# Patient Record
Sex: Female | Born: 1950
Health system: Southern US, Community
[De-identification: ages and names within clinical notes are randomized; demographics above are authoritative.]

## PROBLEM LIST (undated history)

## (undated) DIAGNOSIS — J302 Other seasonal allergic rhinitis: Secondary | ICD-10-CM

## (undated) DIAGNOSIS — E785 Hyperlipidemia, unspecified: Secondary | ICD-10-CM

## (undated) DIAGNOSIS — D649 Anemia, unspecified: Secondary | ICD-10-CM

## (undated) DIAGNOSIS — M1710 Unilateral primary osteoarthritis, unspecified knee: Secondary | ICD-10-CM

## (undated) DIAGNOSIS — J449 Chronic obstructive pulmonary disease, unspecified: Secondary | ICD-10-CM

## (undated) DIAGNOSIS — T7840XA Allergy, unspecified, initial encounter: Secondary | ICD-10-CM

## (undated) DIAGNOSIS — M179 Osteoarthritis of knee, unspecified: Secondary | ICD-10-CM

## (undated) DIAGNOSIS — M19012 Primary osteoarthritis, left shoulder: Secondary | ICD-10-CM

## (undated) DIAGNOSIS — Z923 Personal history of irradiation: Secondary | ICD-10-CM

## (undated) DIAGNOSIS — Z87442 Personal history of urinary calculi: Secondary | ICD-10-CM

## (undated) DIAGNOSIS — N189 Chronic kidney disease, unspecified: Secondary | ICD-10-CM

## (undated) DIAGNOSIS — H269 Unspecified cataract: Secondary | ICD-10-CM

## (undated) DIAGNOSIS — J189 Pneumonia, unspecified organism: Secondary | ICD-10-CM

## (undated) DIAGNOSIS — K219 Gastro-esophageal reflux disease without esophagitis: Secondary | ICD-10-CM

## (undated) HISTORY — DX: Other seasonal allergic rhinitis: J30.2

## (undated) HISTORY — DX: Unilateral primary osteoarthritis, unspecified knee: M17.10

## (undated) HISTORY — DX: Chronic obstructive pulmonary disease, unspecified: J44.9

## (undated) HISTORY — PX: COLONOSCOPY: SHX174

## (undated) HISTORY — DX: Anemia, unspecified: D64.9

## (undated) HISTORY — PX: POLYPECTOMY: SHX149

## (undated) HISTORY — DX: Unspecified cataract: H26.9

## (undated) HISTORY — PX: MOUTH SURGERY: SHX715

## (undated) HISTORY — PX: OTHER SURGICAL HISTORY: SHX169

## (undated) HISTORY — DX: Osteoarthritis of knee, unspecified: M17.9

## (undated) HISTORY — DX: Allergy, unspecified, initial encounter: T78.40XA

## (undated) HISTORY — DX: Gastro-esophageal reflux disease without esophagitis: K21.9

## (undated) HISTORY — DX: Chronic kidney disease, unspecified: N18.9

## (undated) HISTORY — DX: Hyperlipidemia, unspecified: E78.5

---
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
## 1968-09-30 HISTORY — PX: WISDOM TOOTH EXTRACTION: SHX21

## 1978-09-30 HISTORY — PX: TUBAL LIGATION: SHX77

## 1986-09-30 HISTORY — PX: OTHER SURGICAL HISTORY: SHX169

## 1990-09-30 HISTORY — PX: ABDOMINAL HYSTERECTOMY: SHX81

## 1993-09-30 HISTORY — PX: APPENDECTOMY: SHX54

## 1998-04-09 ENCOUNTER — Emergency Department (HOSPITAL_COMMUNITY): Admission: EM | Admit: 1998-04-09 | Discharge: 1998-04-09 | Payer: Self-pay | Admitting: Emergency Medicine

## 1998-12-20 ENCOUNTER — Other Ambulatory Visit: Admission: RE | Admit: 1998-12-20 | Discharge: 1998-12-20 | Payer: Self-pay | Admitting: Family Medicine

## 1998-12-20 ENCOUNTER — Ambulatory Visit (HOSPITAL_COMMUNITY): Admission: RE | Admit: 1998-12-20 | Discharge: 1998-12-20 | Payer: Self-pay | Admitting: Family Medicine

## 1998-12-20 ENCOUNTER — Encounter: Payer: Self-pay | Admitting: Family Medicine

## 2001-05-17 ENCOUNTER — Emergency Department (HOSPITAL_COMMUNITY): Admission: EM | Admit: 2001-05-17 | Discharge: 2001-05-17 | Payer: Self-pay | Admitting: Emergency Medicine

## 2001-05-17 ENCOUNTER — Encounter: Payer: Self-pay | Admitting: Emergency Medicine

## 2004-10-16 ENCOUNTER — Emergency Department (HOSPITAL_COMMUNITY): Admission: EM | Admit: 2004-10-16 | Discharge: 2004-10-16 | Payer: Self-pay | Admitting: Emergency Medicine

## 2005-03-11 ENCOUNTER — Emergency Department (HOSPITAL_COMMUNITY): Admission: EM | Admit: 2005-03-11 | Discharge: 2005-03-11 | Payer: Self-pay | Admitting: Emergency Medicine

## 2005-03-28 ENCOUNTER — Ambulatory Visit (HOSPITAL_COMMUNITY): Admission: RE | Admit: 2005-03-28 | Discharge: 2005-03-28 | Payer: Self-pay | Admitting: Cardiology

## 2005-04-15 ENCOUNTER — Ambulatory Visit (HOSPITAL_COMMUNITY): Admission: RE | Admit: 2005-04-15 | Discharge: 2005-04-15 | Payer: Self-pay | Admitting: Cardiology

## 2005-05-02 ENCOUNTER — Encounter: Admission: RE | Admit: 2005-05-02 | Discharge: 2005-05-02 | Payer: Self-pay | Admitting: Family Medicine

## 2009-09-30 HISTORY — PX: MOUTH SURGERY: SHX715

## 2011-12-19 ENCOUNTER — Ambulatory Visit (INDEPENDENT_AMBULATORY_CARE_PROVIDER_SITE_OTHER): Payer: BC Managed Care – PPO | Admitting: Internal Medicine

## 2011-12-19 ENCOUNTER — Encounter: Payer: Self-pay | Admitting: *Deleted

## 2011-12-19 ENCOUNTER — Encounter: Payer: Self-pay | Admitting: Internal Medicine

## 2011-12-19 ENCOUNTER — Ambulatory Visit (INDEPENDENT_AMBULATORY_CARE_PROVIDER_SITE_OTHER)
Admission: RE | Admit: 2011-12-19 | Discharge: 2011-12-19 | Disposition: A | Discharge status: Home / Self Care | Payer: BC Managed Care – PPO | Source: Ambulatory Visit | Attending: Internal Medicine | Admitting: Internal Medicine

## 2011-12-19 VITALS — BP 118/72 | HR 79 | Temp 98.9°F | Ht 62.0 in | Wt 158.0 lb

## 2011-12-19 DIAGNOSIS — Z720 Tobacco use: Secondary | ICD-10-CM | POA: Insufficient documentation

## 2011-12-19 DIAGNOSIS — R05 Cough: Secondary | ICD-10-CM

## 2011-12-19 DIAGNOSIS — J441 Chronic obstructive pulmonary disease with (acute) exacerbation: Secondary | ICD-10-CM

## 2011-12-19 DIAGNOSIS — F17200 Nicotine dependence, unspecified, uncomplicated: Secondary | ICD-10-CM

## 2011-12-19 DIAGNOSIS — J449 Chronic obstructive pulmonary disease, unspecified: Secondary | ICD-10-CM | POA: Insufficient documentation

## 2011-12-19 MED ORDER — LEVOFLOXACIN 500 MG PO TABS: 500.0000 mg | ORAL_TABLET | Freq: Every day | ORAL | Status: AC

## 2011-12-19 MED ORDER — PREDNISONE (PAK) 10 MG PO TABS: 10.0000 mg | ORAL_TABLET | ORAL | Status: AC

## 2011-12-19 MED ORDER — ALBUTEROL SULFATE HFA 108 (90 BASE) MCG/ACT IN AERS: 2.0000 | INHALATION_SPRAY | Freq: Four times a day (QID) | RESPIRATORY_TRACT | Status: DC | PRN

## 2011-12-19 NOTE — Progress Notes (Signed)
  Subjective:    Patient ID: Jessica [name], female    DOB: Feb 04, 1951, 61 y.o.   MRN: [id number]  HPI New pt to me and our practice, here today to establish care - [facility]  complains of cough Onset 1 week ago, progressively worse symptoms  Pt reports prior history of pneumonia similar to current symptoms  Initially sore throat and "burning">progression to head and chest congestion complains of ongoing cough, day and night - not improved with OTC meds (mucinex) associated with yellow-green sputum and nasal discharge with PND - Also associated with mild shortness of breath but denies wheeze or fever  Past Medical History  Diagnosis Date  . GERD (gastroesophageal reflux disease)   . COPD (chronic obstructive pulmonary disease)     infreq exac, continued tobacco abuse  . Osteoarthritis of knee     R knee    Review of Systems  Constitutional: Positive for fatigue. Negative for chills and unexpected weight change.  HENT: Positive for congestion, postnasal drip and sinus pressure. Negative for ear pain, mouth sores, neck pain and tinnitus.   Eyes: Negative for pain.  Respiratory: Positive for chest tightness. Negative for choking, wheezing and stridor.   Cardiovascular: Negative for chest pain, palpitations and leg swelling.  Genitourinary: Negative for dysuria and difficulty urinating.       Objective:   Physical Exam BP 118/72  Pulse 79  Temp(Src) 98.9 F (37.2 C) (Oral)  Ht 5\' 2"  (1.575 m)  Wt 158 lb (71.668 kg)  BMI 28.90 kg/m2  SpO2 97% Wt Readings from Last 3 Encounters:  12/19/11 158 lb (71.668 kg)   Constitutional: She appears well-developed and well-nourished. No distress.  HENT: Head: Normocephalic and atraumatic. Ears: B TMs ok, no erythema or effusion; Nose: Nose normal. Mouth/Throat: Oropharynx is red but without oropharyngeal exudate. missing 2 lower front teeth Eyes: Conjunctivae and EOM are normal. Pupils are equal, round, and reactive to light.  No scleral icterus.  Neck: Normal range of motion. Neck supple. No JVD or LAD present. No thyromegaly present.  Cardiovascular: Normal rate, regular rhythm and normal heart sounds.  No murmur heard. No BLE edema. Pulmonary/Chest: diminished base breath sounds with RLL crackles. No respiratory distress at rest or conversational dyspnea. She has few mild end exp wheezes.  Neurological: She is alert and oriented to person, place, and time. No cranial nerve deficit. Coordination normal.  Skin: Skin is warm and dry. No rash noted. No erythema.  Psychiatric: She has a normal mood and affect. Her behavior is normal. Judgment and thought content normal.   No results found for this basename: WBC, HGB, HCT, PLT, GLUCOSE, CHOL, TRIG, HDL, LDLDIRECT, LDLCALC, ALT, AST, NA, K, CL, CREATININE, BUN, CO2, TSH, PSA, INR, GLUF, HGBA1C, MICROALBUR        Assessment & Plan:  Cough, ?PNA vs bronchitis Tobacco abuse COPD exac, mild due to above  Check CXR Levaquin, pred pak and Alb MDI - erx done Advised to quit smoking - 5 minutes today spent counseling patient on unhealthy effects of continued tobacco abuse and encouragement of cessation including medical options available to help the patient quit smoking.  Send for ROI from prior provider

## 2011-12-19 NOTE — Patient Instructions (Signed)
It was good to see you today. Levaquin antibiotics, pred taper and albuterol inhaler to use as needed - Your prescription(s) have been submitted to your pharmacy. Please take as directed and contact our office if you believe you are having problem(s) with the medication(s). Chest xray ordered today. Your results will be called to you after review (48-72hours after test completion). If any changes need to be made, you will be notified at that time. Please schedule followup in 2 months for physical and labs, call sooner if problems. Smoking Cessation, Tips for Success YOU CAN QUIT SMOKING If you are ready to quit smoking, congratulations! You have chosen to help yourself be healthier. Cigarettes bring nicotine, tar, carbon monoxide, and other irritants into your body. Your lungs, heart, and blood vessels will be able to work better without these poisons. There are many different ways to quit smoking. Nicotine gum, nicotine patches, a nicotine inhaler, or nicotine nasal spray can help with physical craving. Hypnosis, support groups, and medicines help break the habit of smoking. Here are some tips to help you quit for good.  Throw away all cigarettes.   Clean and remove all ashtrays from your home, work, and car.   On a card, write down your reasons for quitting. Carry the card with you and read it when you get the urge to smoke.   Cleanse your body of nicotine. Drink enough water and fluids to keep your urine clear or pale yellow. Do this after quitting to flush the nicotine from your body.   Learn to predict your moods. Do not let a bad situation be your excuse to have a cigarette. Some situations in your life might tempt you into wanting a cigarette.   Never have "just one" cigarette. It leads to wanting another and another. Remind yourself of your decision to quit.   Change habits associated with smoking. If you smoked while driving or when feeling stressed, try other activities to replace  smoking. Stand up when drinking your coffee. Brush your teeth after eating. Sit in a different chair when you read the paper. Avoid alcohol while trying to quit, and try to drink fewer caffeinated beverages. Alcohol and caffeine may urge you to smoke.   Avoid foods and drinks that can trigger a desire to smoke, such as sugary or spicy foods and alcohol.   Ask people who smoke not to smoke around you.   Have something planned to do right after eating or having a cup of coffee. Take a walk or exercise to perk you up. This will help to keep you from overeating.   Try a relaxation exercise to calm you down and decrease your stress. Remember, you may be tense and nervous for the first 2 weeks after you quit, but this will pass.   Find new activities to keep your hands busy. Play with a pen, coin, or rubber band. Doodle or draw things on paper.   Brush your teeth right after eating. This will help cut down on the craving for the taste of tobacco after meals. You can try mouthwash, too.   Use oral substitutes, such as lemon drops, carrots, a cinnamon stick, or chewing gum, in place of cigarettes. Keep them handy so they are available when you have the urge to smoke.   When you have the urge to smoke, try deep breathing.   Designate your home as a nonsmoking area.   If you are a heavy smoker, ask your caregiver about a prescription for  nicotine chewing gum. It can ease your withdrawal from nicotine.   Reward yourself. Set aside the cigarette money you save and buy yourself something nice.   Look for support from others. Join a support group or smoking cessation program. Ask someone at home or at work to help you with your plan to quit smoking.   Always ask yourself, "Do I need this cigarette or is this just a reflex?" Tell yourself, "Today, I choose not to smoke," or "I do not want to smoke." You are reminding yourself of your decision to quit, even if you do smoke a cigarette.  HOW WILL I FEEL  WHEN I QUIT SMOKING?  The benefits of not smoking start within days of quitting.   You may have symptoms of withdrawal because your body is used to nicotine (the addictive substance in cigarettes). You may crave cigarettes, be irritable, feel very hungry, cough often, get headaches, or have difficulty concentrating.   The withdrawal symptoms are only temporary. They are strongest when you first quit but will go away within 10 to 14 days.   When withdrawal symptoms occur, stay in control. Think about your reasons for quitting. Remind yourself that these are signs that your body is healing and getting used to being without cigarettes.   Remember that withdrawal symptoms are easier to treat than the major diseases that smoking can cause.   Even after the withdrawal is over, expect periodic urges to smoke. However, these cravings are generally short-lived and will go away whether you smoke or not. Do not smoke!   If you relapse and smoke again, do not lose hope. Most smokers quit 3 times before they are successful.   If you relapse, do not give up! Plan ahead and think about what you will do the next time you get the urge to smoke.  LIFE AS A NONSMOKER: MAKE IT FOR A MONTH, MAKE IT FOR LIFE Day 1: Hang this page where you will see it every day. Day 2: Get rid of all ashtrays, matches, and lighters. Day 3: Drink water. Breathe deeply between sips. Day 4: Avoid places with smoke-filled air, such as bars, clubs, or the smoking section of restaurants. Day 5: Keep track of how much money you save by not smoking. Day 6: Avoid boredom. Keep a good book with you or go to the movies. Day 7: Reward yourself! One week without smoking! Day 8: Make a dental appointment to get your teeth cleaned. Day 9: Decide how you will turn down a cigarette before it is offered to you. Day 10: Review your reasons for quitting. Day 11: Distract yourself. Stay active to keep your mind off smoking and to relieve tension.  Take a walk, exercise, read a book, do a crossword puzzle, or try a new hobby. Day 12: Exercise. Get off the bus before your stop or use stairs instead of escalators. Day 13: Call on friends for support and encouragement. Day 14: Reward yourself! Two weeks without smoking! Day 15: Practice deep breathing exercises. Day 16: Bet a friend that you can stay a nonsmoker. Day 17: Ask to sit in nonsmoking sections of restaurants. Day 18: Hang up "No Smoking" signs. Day 19: Think of yourself as a nonsmoker. Day 20: Each morning, tell yourself you will not smoke. Day 21: Reward yourself! Three weeks without smoking! Day 22: Think of smoking in negative ways. Remember how it stains your teeth, gives you bad breath, and leaves you short of breath. Day 23: Eat  a nutritious breakfast. Day 24:Do not relive your days as a smoker. Day 25: Hold a pencil in your hand when talking on the telephone. Day 26: Tell all your friends you do not smoke. Day 27: Think about how much better food tastes. Day 28: Remember, one cigarette is one too many. Day 29: Take up a hobby that will keep your hands busy. Day 30: Congratulations! One month without smoking! Give yourself a big reward. Your caregiver can direct you to community resources or hospitals for support, which may include:  Group support.   Education.   Hypnosis.   Subliminal therapy.  Document Released: 06/14/2004 Document Revised: 09/05/2011 Document Reviewed: 07/03/2009 [facility] Patient Information 2012 [place], [place].

## 2012-01-18 ENCOUNTER — Encounter: Payer: Self-pay | Admitting: Family Medicine

## 2012-01-18 ENCOUNTER — Ambulatory Visit (INDEPENDENT_AMBULATORY_CARE_PROVIDER_SITE_OTHER): Payer: BC Managed Care – PPO | Admitting: Family Medicine

## 2012-01-18 VITALS — BP 120/68 | Temp 98.0°F | Wt 159.0 lb

## 2012-01-18 DIAGNOSIS — J329 Chronic sinusitis, unspecified: Secondary | ICD-10-CM

## 2012-01-18 DIAGNOSIS — J40 Bronchitis, not specified as acute or chronic: Secondary | ICD-10-CM

## 2012-01-18 MED ORDER — DOXYCYCLINE HYCLATE 100 MG PO TABS: 100.0000 mg | ORAL_TABLET | Freq: Two times a day (BID) | ORAL | Status: AC

## 2012-01-18 NOTE — Patient Instructions (Signed)

## 2012-01-18 NOTE — Progress Notes (Signed)
  Subjective:    Patient ID: Jessica [name], female    DOB: 03-Aug-1951, 61 y.o.   MRN: [id number]  HPI Cough and congestion.  Says was seen a couple of weeks ago.  A week ago outside all day with fresh cut grass and thought just had allergies.  Then on Monday (6 days ) has very ST, severe nasal congestion with nasal green d/c. Thick productive cough.  No fever.  Has been having chills.  No cough or cold medications.  Using some vicks at night. Hx of COPD.  Not using her inhaler. No SOB. No wheezing.    Review of Systems     Objective:   Physical Exam  Constitutional: She is oriented to person, place, and time. She appears well-developed and well-nourished.  HENT:  Head: Normocephalic and atraumatic.  Right Ear: External ear normal.  Left Ear: External ear normal.  Nose: Nose normal.  Mouth/Throat: Oropharynx is clear and moist.       TMs and canals are clear.   Eyes: Conjunctivae and EOM are normal. Pupils are equal, round, and reactive to light.  Neck: Neck supple. No thyromegaly present.  Cardiovascular: Normal rate, regular rhythm and normal heart sounds.   Pulmonary/Chest: Effort normal and breath sounds normal. She has no wheezes.       End inspiratory wheeze.   Lymphadenopathy:    She has no cervical adenopathy.  Neurological: She is alert and oriented to person, place, and time.  Skin: Skin is warm and dry.  Psychiatric: She has a normal mood and affect.          Assessment & Plan:  Sinsitis/bronchitis - Still smoking. Right now COPD is stable. Did encourage her to start using inhaler bid while sick.  Start doxy.  Just had course of levaquin about 4 weeks ago.  Nasal saline. Salt water gargles. Call if breathing gets worse or gets a fever.  comple all the ABX.

## 2012-02-20 ENCOUNTER — Ambulatory Visit: Payer: BC Managed Care – PPO | Admitting: Internal Medicine

## 2012-02-21 ENCOUNTER — Encounter: Payer: Self-pay | Admitting: Internal Medicine

## 2012-02-21 ENCOUNTER — Other Ambulatory Visit (INDEPENDENT_AMBULATORY_CARE_PROVIDER_SITE_OTHER): Payer: BC Managed Care – PPO

## 2012-02-21 ENCOUNTER — Ambulatory Visit (INDEPENDENT_AMBULATORY_CARE_PROVIDER_SITE_OTHER): Payer: BC Managed Care – PPO | Admitting: Internal Medicine

## 2012-02-21 VITALS — BP 114/70 | HR 76 | Temp 97.1°F | Wt 157.8 lb

## 2012-02-21 DIAGNOSIS — Z0000 Encounter for general adult medical examination without abnormal findings: Secondary | ICD-10-CM

## 2012-02-21 DIAGNOSIS — Z124 Encounter for screening for malignant neoplasm of cervix: Secondary | ICD-10-CM

## 2012-02-21 DIAGNOSIS — Z1211 Encounter for screening for malignant neoplasm of colon: Secondary | ICD-10-CM

## 2012-02-21 DIAGNOSIS — Z23 Encounter for immunization: Secondary | ICD-10-CM

## 2012-02-21 DIAGNOSIS — Z1239 Encounter for other screening for malignant neoplasm of breast: Secondary | ICD-10-CM

## 2012-02-21 LAB — CBC WITH DIFFERENTIAL/PLATELET
Basophils Relative: 0.5 % (ref 0.0–3.0)
Eosinophils Absolute: 0.2 10*3/uL (ref 0.0–0.7)
HCT: 41.5 % (ref 36.0–46.0)
Hemoglobin: 14 g/dL (ref 12.0–15.0)
Lymphocytes Relative: 27.9 % (ref 12.0–46.0)
MCHC: 33.7 g/dL (ref 30.0–36.0)
Neutrophils Relative %: 60.2 % (ref 43.0–77.0)
Platelets: 185 10*3/uL (ref 150.0–400.0)

## 2012-02-21 LAB — LIPID PANEL
Cholesterol: 250 mg/dL — ABNORMAL HIGH (ref 0–200)
Total CHOL/HDL Ratio: 6
Triglycerides: 179 mg/dL — ABNORMAL HIGH (ref 0.0–149.0)

## 2012-02-21 LAB — BASIC METABOLIC PANEL
BUN: 20 mg/dL (ref 6–23)
CO2: 29 mEq/L (ref 19–32)
Calcium: 9.2 mg/dL (ref 8.4–10.5)
Chloride: 106 mEq/L (ref 96–112)
Glucose, Bld: 80 mg/dL (ref 70–99)

## 2012-02-21 LAB — URINALYSIS, ROUTINE W REFLEX MICROSCOPIC
Total Protein, Urine: NEGATIVE
Urine Glucose: NEGATIVE
pH: 5.5 (ref 5.0–8.0)

## 2012-02-21 LAB — HEPATIC FUNCTION PANEL
ALT: 21 U/L (ref 0–35)
AST: 22 U/L (ref 0–37)
Bilirubin, Direct: 0.1 mg/dL (ref 0.0–0.3)
Total Bilirubin: 0.5 mg/dL (ref 0.3–1.2)
Total Protein: 7.5 g/dL (ref 6.0–8.3)

## 2012-02-21 MED ORDER — LORATADINE 10 MG PO TABS: 10.0000 mg | ORAL_TABLET | Freq: Every day | ORAL | Status: DC | PRN

## 2012-02-21 NOTE — Patient Instructions (Signed)
It was good to see you today. We have reviewed your interval medical history today Test(s) ordered today. Your results will be called to you after review (48-72hours after test completion). If any changes need to be made, you will be notified at that time. we'll make referral to mammogram, PAP and colonoscopy. Our office will contact you regarding appointment(s) once made. Health Maintenance reviewed - tetanus booster and pneumonia shot today, will consider the shingles vaccine as discussed- all other recommended immunizations and age-appropriate screenings are up-to-date.  Start claritin for allergy symptoms - Your prescription(s) have been submitted to your pharmacy. Please take as directed and contact our office if you believe you are having problem(s) with the medication(s). Other Medications reviewed, no changes at this time.  Please schedule followup in 6-12 months, call sooner if problems.

## 2012-02-21 NOTE — Progress Notes (Signed)
  Subjective:    Patient ID: Jessica [name], female    DOB: 08-19-1951, 61 y.o.   MRN: [id number]  HPI patient is here today for annual physical. Patient feels well and has no complaints.  Past Medical History  Diagnosis Date  . GERD (gastroesophageal reflux disease)   . COPD (chronic obstructive pulmonary disease)     infreq exac, continued tobacco abuse  . Osteoarthritis of knee     R knee   Family History  Problem Relation Age of Onset  . Breast cancer Maternal Aunt   . Hypertension Mother   . Stroke Other   . Heart disease Mother   . Hypertension Brother   . Multiple sclerosis Father    History  Substance Use Topics  . Smoking status: Current Everyday Smoker  . Smokeless tobacco: Not on file  . Alcohol Use: Yes     Infrequently      Review of Systems Constitutional: Negative for fever or weight change.  Respiratory: Negative for cough and shortness of breath.   Cardiovascular: Negative for chest pain or palpitations.  Gastrointestinal: Negative for abdominal pain, no bowel changes.  Musculoskeletal: Negative for gait problem or joint swelling.  Skin: Negative for rash.  Neurological: Negative for dizziness or headache.  No other specific complaints in a complete review of systems (except as listed in HPI above).     Objective:   Physical Exam BP 114/70  Pulse 76  Temp(Src) 97.1 F (36.2 C) (Oral)  Wt 157 lb 12.8 oz (71.578 kg) Wt Readings from Last 3 Encounters:  02/21/12 157 lb 12.8 oz (71.578 kg)  01/18/12 159 lb (72.122 kg)  12/19/11 158 lb (71.668 kg)   Constitutional: She appears well-developed and well-nourished. No distress.  HENT: Head: Normocephalic and atraumatic. Ears: B TMs ok, no erythema or effusion; Nose: Nose normal. Mouth/Throat: Oropharynx is clear and moist. No oropharyngeal exudate.  Eyes: Conjunctivae and EOM are normal. Pupils are equal, round, and reactive to light. No scleral icterus.  Neck: Normal range of motion. Neck supple.  No JVD present. No thyromegaly present.  Cardiovascular: Normal rate, regular rhythm and normal heart sounds.  No murmur heard. No BLE edema. Pulmonary/Chest: Effort normal and breath sounds normal. No respiratory distress. She has no wheezes.  Abdominal: Soft. Bowel sounds are normal. She exhibits no distension. There is no tenderness. no masses Musculoskeletal: Normal range of motion, no joint effusions. No gross deformities Neurological: She is alert and oriented to person, place, and time. No cranial nerve deficit. Coordination normal.  Skin: Skin is warm and dry. No rash noted. No erythema.  Psychiatric: She has a normal mood and affect. Her behavior is normal. Judgment and thought content normal.   No results found for this basename: WBC, HGB, HCT, PLT, GLUCOSE, CHOL, TRIG, HDL, LDLDIRECT, LDLCALC, ALT, AST, NA, K, CL, CREATININE, BUN, CO2, TSH, PSA, INR, GLUF, HGBA1C, MICROALBUR       Assessment & Plan:  CPX/v70.0 - Patient has been counseled on age-appropriate routine health concerns for screening and prevention. These are reviewed and up-to-date. Immunizations are up-to-date or declined. Labs ordered and will be reviewed.

## 2012-03-25 ENCOUNTER — Encounter: Payer: Self-pay | Admitting: Internal Medicine

## 2012-04-29 ENCOUNTER — Ambulatory Visit (INDEPENDENT_AMBULATORY_CARE_PROVIDER_SITE_OTHER): Payer: BC Managed Care – PPO | Admitting: Endocrinology

## 2012-04-29 ENCOUNTER — Encounter: Payer: Self-pay | Admitting: Endocrinology

## 2012-04-29 VITALS — BP 110/68 | HR 74 | Temp 98.1°F | Wt 157.0 lb

## 2012-04-29 DIAGNOSIS — H6120 Impacted cerumen, unspecified ear: Secondary | ICD-10-CM

## 2012-04-29 NOTE — Patient Instructions (Addendum)
Please call if there is anything else we can do for you

## 2012-04-29 NOTE — Progress Notes (Signed)
  Subjective:    Patient ID: Jessica [name], female    DOB: 1951/03/05, 61 y.o.   MRN: [id number]  HPI Pt states few days of slightly decreased hearing in the right ear, and assoc sensation of foreign body.  She thinks it is a q-tip. Past Medical History  Diagnosis Date  . GERD (gastroesophageal reflux disease)   . COPD (chronic obstructive pulmonary disease)     infreq exac, continued tobacco abuse  . Osteoarthritis of knee     R knee    Past Surgical History  Procedure Date  . Appendectomy 1995  . Abdominal hysterectomy 1992  . Right knee surgery 88 & 02    "torn cartledge"    History   Social History  . Marital Status: Married    Spouse Name: N/A    Number of Children: N/A  . Years of Education: N/A   Occupational History  . Not on file.   Social History Main Topics  . Smoking status: Current Everyday Smoker  . Smokeless tobacco: Not on file  . Alcohol Use: Yes     Infrequently   . Drug Use: No  . Sexually Active: Not on file   Other Topics Concern  . Not on file   Social History Narrative  . No narrative on file    Current Outpatient Prescriptions on File Prior to Visit  Medication Sig Dispense Refill  . albuterol (PROVENTIL HFA;VENTOLIN HFA) 108 (90 BASE) MCG/ACT inhaler Inhale 2 puffs into the lungs every 6 (six) hours as needed for wheezing.  1 Inhaler  0  . b complex vitamins tablet Take 1 tablet by mouth daily.      Marland Kitchen DIGESTIVE ENZYMES PO Take by mouth daily.      Marland Kitchen loratadine (CLARITIN) 10 MG tablet Take 1 tablet (10 mg total) by mouth daily as needed for allergies.  30 tablet  2  . DISCONTD: Digestive Enzymes (ACID-EASE PO) Take by mouth daily as needed.        No Known Allergies  Family History  Problem Relation Age of Onset  . Breast cancer Maternal Aunt   . Hypertension Mother   . Stroke Other   . Heart disease Mother   . Hypertension Brother   . Multiple sclerosis Father     BP 110/68  Pulse 74  Temp 98.1 F (36.7 C) (Oral)  Wt  157 lb (71.215 kg)  SpO2 95%  Review of Systems Denies otalgia    Objective:   Physical Exam VITAL SIGNS:  See vs page GENERAL: no distress Right eac:  q-tip is removed eac is still occluded with cerumen   Intervention:  The eac is irrigated, with resolution of sxs and abnormal findings     Assessment & Plan:  Successful q-tip removal Successful rx of eac impaction

## 2012-05-11 ENCOUNTER — Encounter: Payer: Self-pay | Admitting: Gastroenterology

## 2012-08-10 ENCOUNTER — Encounter: Payer: Self-pay | Admitting: Internal Medicine

## 2012-08-10 ENCOUNTER — Ambulatory Visit (INDEPENDENT_AMBULATORY_CARE_PROVIDER_SITE_OTHER): Payer: BC Managed Care – PPO | Admitting: Internal Medicine

## 2012-08-10 VITALS — BP 120/70 | HR 77 | Temp 98.4°F | Ht 62.0 in | Wt 159.4 lb

## 2012-08-10 DIAGNOSIS — J209 Acute bronchitis, unspecified: Secondary | ICD-10-CM

## 2012-08-10 DIAGNOSIS — J441 Chronic obstructive pulmonary disease with (acute) exacerbation: Secondary | ICD-10-CM

## 2012-08-10 MED ORDER — LEVOFLOXACIN 500 MG PO TABS: 500.0000 mg | ORAL_TABLET | Freq: Every day | ORAL | Status: DC

## 2012-08-10 MED ORDER — PREDNISONE (PAK) 10 MG PO TABS: 10.0000 mg | ORAL_TABLET | ORAL | Status: DC

## 2012-08-10 MED ORDER — ALBUTEROL SULFATE HFA 108 (90 BASE) MCG/ACT IN AERS: 2.0000 | INHALATION_SPRAY | Freq: Four times a day (QID) | RESPIRATORY_TRACT | Status: DC | PRN

## 2012-08-10 MED ORDER — HYDROCODONE-HOMATROPINE 5-1.5 MG/5ML PO SYRP: 5.0000 mL | ORAL_SOLUTION | Freq: Four times a day (QID) | ORAL | Status: DC | PRN

## 2012-08-10 NOTE — Patient Instructions (Signed)
It was good to see you today. Levaquin antibiotics, pred taper x 6 days and prescription cough syrup - Your prescription(s) have been submitted to your pharmacy. Please take as directed and contact our office if you believe you are having problem(s) with the medication(s). Also Albuterol rescue inhaler to use as needed for cough/wheeze symptoms  Alternate between ibuprofen and tylenol for aches, pain and fever symptoms as discussed Hydrate, rest and call if worse or unimproved Continue to think about giving up cigarettes! Use nicotine gum, nicotine patches or electronic cigarettes. If you're interested in medication to help you quit, please call  - let me know how I can help! Please schedule followup in 6 months for annual visit and labs, call sooner if problems. Acute Bronchitis You have acute bronchitis. This means you have a chest cold. The airways in your lungs are red and sore (inflamed). Acute means it is sudden onset.   CAUSES Bronchitis is most often caused by the same virus that causes a cold. SYMPTOMS    Body aches.   Chest congestion.   Chills.   Cough.   Fever.   Shortness of breath.   Sore throat.  TREATMENT   Acute bronchitis is usually treated with rest, fluids, and medicines for relief of fever or cough. Most symptoms should go away after a few days or a week. Increased fluids may help thin your secretions and will prevent dehydration. Your caregiver may give you an inhaler to improve your symptoms. The inhaler reduces shortness of breath and helps control cough. You can take over-the-counter pain relievers or cough medicine to decrease coughing, pain, or fever. A cool-air vaporizer may help thin bronchial secretions and make it easier to clear your chest. Antibiotics are usually not needed but can be prescribed if you smoke, are seriously ill, have chronic lung problems, are elderly, or you are at higher risk for developing complications. Allergies and asthma can make  bronchitis worse. Repeated episodes of bronchitis may cause longstanding lung problems. Avoid smoking and secondhand smoke. Exposure to cigarette smoke or irritating chemicals will make bronchitis worse. If you are a cigarette smoker, consider using nicotine gum or skin patches to help control withdrawal symptoms. Quitting smoking will help your lungs heal faster. Recovery from bronchitis is often slow, but you should start feeling better after 2 to 3 days. Cough from bronchitis frequently lasts for 3 to 4 weeks. To prevent another bout of acute bronchitis:  Quit smoking.   Wash your hands frequently to get rid of viruses or use a hand sanitizer.   Avoid other people with cold or virus symptoms.   Try not to touch your hands to your mouth, nose, or eyes.  SEEK IMMEDIATE MEDICAL CARE IF:  You develop increased fever, chills, or chest pain.   You have severe shortness of breath or bloody sputum.   You develop dehydration, fainting, repeated vomiting, or a severe headache.   You have no improvement after 1 week of treatment or you get worse.  MAKE SURE YOU:    Understand these instructions.   Will watch your condition.   Will get help right away if you are not doing well or get worse.  Document Released: 10/24/2004 Document Revised: 12/09/2011 Document Reviewed: 01/09/2011 [facility] Patient Information 2013 [place], [place].   You Can Quit Smoking If you are ready to quit smoking or are thinking about it, congratulations! You have chosen to help yourself be healthier and live longer! There are lots of different ways to  quit smoking. Nicotine gum, nicotine patches, a nicotine inhaler, or nicotine nasal spray can help with physical craving. Hypnosis, support groups, and medicines help break the habit of smoking. TIPS TO GET OFF AND STAY OFF CIGARETTES  Learn to predict your moods. Do not let a bad situation be your excuse to have a cigarette. Some situations in your life might tempt you to  have a cigarette.   Ask friends and co-workers not to smoke around you.   Make your home smoke-free.   Never have "just one" cigarette. It leads to wanting another and another. Remind yourself of your decision to quit.   On a card, make a list of your reasons for not smoking. Read it at least the same number of times a day as you have a cigarette. Tell yourself everyday, "I do not want to smoke. I choose not to smoke."   Ask someone at home or work to help you with your plan to quit smoking.   Have something planned after you eat or have a cup of coffee. Take a walk or get other exercise to perk you up. This will help to keep you from overeating.   Try a relaxation exercise to calm you down and decrease your stress. Remember, you may be tense and nervous the first two weeks after you quit. This will pass.   Find new activities to keep your hands busy. Play with a pen, coin, or rubber band. Doodle or draw things on paper.   Brush your teeth right after eating. This will help cut down the craving for the taste of tobacco after meals. You can try mouthwash too.   Try gum, breath mints, or diet candy to keep something in your mouth.  IF YOU SMOKE AND WANT TO QUIT:  Do not stock up on cigarettes. Never buy a carton. Wait until one pack is finished before you buy another.   Never carry cigarettes with you at work or at home.   Keep cigarettes as far away from you as possible. Leave them with someone else.   Never carry matches or a lighter with you.   Ask yourself, "Do I need this cigarette or is this just a reflex?"   Bet with someone that you can quit. Put cigarette money in a piggy bank every morning. If you smoke, you give up the money. If you do not smoke, by the end of the week, you keep the money.   Keep trying. It takes 21 days to change a habit!   Talk to your doctor about using medicines to help you quit. These include nicotine replacement gum, lozenges, or skin patches.    Document Released: 07/13/2009 Document Revised: 12/09/2011 Document Reviewed: 07/13/2009 [facility] Patient Information 2013 [place], [place].

## 2012-08-10 NOTE — Progress Notes (Signed)
  Subjective:    Patient ID: Jessica [name], female    DOB: 12-21-1950, 61 y.o.   MRN: [id number]  HPI  Here for follow up -  Also complains of URI symptoms -  Onset 5 days ago Precipitated by sick contacts and weather changes associated with head>chest congestion, sore throat and fatigue Nasal discharge, productive cough and wheeze, especially lying supine Using OTC meds without much relief of symptoms  Still smoking  Past Medical History  Diagnosis Date  . GERD (gastroesophageal reflux disease)   . COPD (chronic obstructive pulmonary disease)     infreq exac, continued tobacco abuse  . Osteoarthritis of knee     R knee    Review of Systems  Constitutional: Positive for fatigue. Negative for unexpected weight change.  Respiratory: Positive for cough, chest tightness and wheezing.   Cardiovascular: Negative for chest pain and leg swelling.  Musculoskeletal: Negative for myalgias and arthralgias.  Neurological: Negative for dizziness, weakness and headaches.       Objective:   Physical Exam BP 120/70  Pulse 77  Temp 98.4 F (36.9 C) (Oral)  Ht 5\' 2"  (1.575 m)  Wt 159 lb 6.4 oz (72.303 kg)  BMI 29.15 kg/m2  SpO2 98% Wt Readings from Last 3 Encounters:  08/10/12 159 lb 6.4 oz (72.303 kg)  04/29/12 157 lb (71.215 kg)  02/21/12 157 lb 12.8 oz (71.578 kg)   Constitutional: She appears well-developed and well-nourished. No distress.  HENT: Head: Normocephalic and atraumatic. Inus mildly tender to palp. Ears: B TMs ok, no erythema or effusion; Nose: Nose normal. Mouth/Throat: Oropharynx is red with PND, but no oropharyngeal exudate.  Eyes: Conjunctivae and EOM are normal. Pupils are equal, round, and reactive to light. No scleral icterus.  Neck: Normal range of motion. Neck supple. No JVD present. Mild L LAD. No thyromegaly present.  Cardiovascular: Normal rate, regular rhythm and normal heart sounds.  No murmur heard. No BLE edema. Pulmonary/Chest: Rhonchi and exp  wheeze L base -Effort normal - No respiratory distress. She has no wheezes.  Neurological: She is alert and oriented to person, place, and time. No cranial nerve deficit. Coordination normal.  Psychiatric: She has a normal mood and affect. Her behavior is normal. Judgment and thought content normal.   Lab Results  Component Value Date   WBC 7.0 02/21/2012   HGB 14.0 02/21/2012   HCT 41.5 02/21/2012   PLT 185.0 02/21/2012   GLUCOSE 80 02/21/2012   CHOL 250* 02/21/2012   TRIG 179.0* 02/21/2012   HDL 40.20 02/21/2012   LDLDIRECT 188.4 02/21/2012   ALT 21 02/21/2012   AST 22 02/21/2012   NA 141 02/21/2012   K 4.0 02/21/2012   CL 106 02/21/2012   CREATININE 0.8 02/21/2012   BUN 20 02/21/2012   CO2 29 02/21/2012   TSH 1.99 02/21/2012        Assessment & Plan:   Acute bronchitis with COPD exacerbation - Tobacco abuse ongoing  Treat with 7 days Levaquin, six-day prednisone taper and Hydromet for cough symptoms Albuterol rescue inhaler -educated on appropriate use of same 5 minutes today spent counseling patient on unhealthy effects of continued tobacco abuse and encouragement of cessation including medical options available to help the patient quit smoking.

## 2012-08-18 ENCOUNTER — Ambulatory Visit: Payer: BC Managed Care – PPO | Admitting: Internal Medicine

## 2013-02-09 ENCOUNTER — Telehealth: Payer: Self-pay | Admitting: *Deleted

## 2013-02-09 ENCOUNTER — Ambulatory Visit (INDEPENDENT_AMBULATORY_CARE_PROVIDER_SITE_OTHER): Payer: BC Managed Care – PPO | Admitting: Internal Medicine

## 2013-02-09 ENCOUNTER — Encounter: Payer: Self-pay | Admitting: Internal Medicine

## 2013-02-09 VITALS — BP 112/72 | HR 66 | Temp 98.4°F | Wt 156.1 lb

## 2013-02-09 DIAGNOSIS — E785 Hyperlipidemia, unspecified: Secondary | ICD-10-CM | POA: Insufficient documentation

## 2013-02-09 DIAGNOSIS — K219 Gastro-esophageal reflux disease without esophagitis: Secondary | ICD-10-CM | POA: Insufficient documentation

## 2013-02-09 DIAGNOSIS — Z720 Tobacco use: Secondary | ICD-10-CM

## 2013-02-09 DIAGNOSIS — J449 Chronic obstructive pulmonary disease, unspecified: Secondary | ICD-10-CM

## 2013-02-09 DIAGNOSIS — Z23 Encounter for immunization: Secondary | ICD-10-CM

## 2013-02-09 DIAGNOSIS — F17200 Nicotine dependence, unspecified, uncomplicated: Secondary | ICD-10-CM

## 2013-02-09 DIAGNOSIS — Z2911 Encounter for prophylactic immunotherapy for respiratory syncytial virus (RSV): Secondary | ICD-10-CM

## 2013-02-09 DIAGNOSIS — Z0000 Encounter for general adult medical examination without abnormal findings: Secondary | ICD-10-CM

## 2013-02-09 MED ORDER — ALBUTEROL SULFATE HFA 108 (90 BASE) MCG/ACT IN AERS: 2.0000 | INHALATION_SPRAY | Freq: Four times a day (QID) | RESPIRATORY_TRACT | Status: DC | PRN

## 2013-02-09 MED ORDER — VARENICLINE TARTRATE 0.5 MG X 11 & 1 MG X 42 PO MISC: ORAL | Status: DC

## 2013-02-09 NOTE — Telephone Encounter (Signed)
Message copied by Deatra James on Tue Feb 09, 2013 11:15 AM ------      Message from: Etheleen Sia      Created: Tue Feb 09, 2013  9:13 AM      Regarding: LABS       PHYSICAL LABS IN MID JULY ------

## 2013-02-09 NOTE — Telephone Encounter (Signed)
Entered cpx labs...lmb 

## 2013-02-09 NOTE — Assessment & Plan Note (Signed)
Lipids reviewed 01/2012 - pt unaware of same Recheck in 6-12 weeks at [facility] follow up - consider statin if LDL >160

## 2013-02-09 NOTE — Patient Instructions (Signed)
It was good to see you today. We have reviewed your prior records including labs and tests today Test(s) ordered today. Your results will be released to MyChart (or called to you) after review, usually within 72hours after test completion. If any changes need to be made, you will be notified at that same time. Start pack Chantix for assistance with smoking cessation - Your prescription(s) have been submitted to your pharmacy. Please take as directed and contact our office if you believe you are having problem(s) with the medication(s). Refill on albuterol as discussed Followup in 6-12 weeks to review progress and schedule for medical physical/labs Cholesterol LDL remains greater than 160, may consider medication to reduce cholesterol  Smoking Cessation Quitting smoking is important to your health and has many advantages. However, it is not always easy to quit since nicotine is a very addictive drug. Often times, people try 3 times or more before being able to quit. This document explains the best ways for you to prepare to quit smoking. Quitting takes hard work and a lot of effort, but you can do it. ADVANTAGES OF QUITTING SMOKING  You will live longer, feel better, and live better.  Your body will feel the impact of quitting smoking almost immediately.  Within 20 minutes, blood pressure decreases. Your pulse returns to its normal level.  After 8 hours, carbon monoxide levels in the blood return to normal. Your oxygen level increases.  After 24 hours, the chance of having a heart attack starts to decrease. Your breath, hair, and body stop smelling like smoke.  After 48 hours, damaged nerve endings begin to recover. Your sense of taste and smell improve.  After 72 hours, the body is virtually free of nicotine. Your bronchial tubes relax and breathing becomes easier.  After 2 to 12 weeks, lungs can hold more air. Exercise becomes easier and circulation improves.  The risk of having a heart  attack, stroke, cancer, or lung disease is greatly reduced.  After 1 year, the risk of coronary heart disease is cut in half.  After 5 years, the risk of stroke falls to the same as a nonsmoker.  After 10 years, the risk of lung cancer is cut in half and the risk of other cancers decreases significantly.  After 15 years, the risk of coronary heart disease drops, usually to the level of a nonsmoker.  If you are pregnant, quitting smoking will improve your chances of having a healthy baby.  The people you live with, especially any children, will be healthier.  You will have extra money to spend on things other than cigarettes. QUESTIONS TO THINK ABOUT BEFORE ATTEMPTING TO QUIT You may want to talk about your answers with your caregiver.  Why do you want to quit?  If you tried to quit in the past, what helped and what did not?  What will be the most difficult situations for you after you quit? How will you plan to handle them?  Who can help you through the tough times? Your family? Friends? A caregiver?  What pleasures do you get from smoking? What ways can you still get pleasure if you quit? Here are some questions to ask your caregiver:  How can you help me to be successful at quitting?  What medicine do you think would be best for me and how should I take it?  What should I do if I need more help?  What is smoking withdrawal like? How can I get information on withdrawal? GET  READY  Set a quit date.  Change your environment by getting rid of all cigarettes, ashtrays, matches, and lighters in your home, car, or work. Do not let people smoke in your home.  Review your past attempts to quit. Think about what worked and what did not. GET SUPPORT AND ENCOURAGEMENT You have a better chance of being successful if you have help. You can get support in many ways.  Tell your family, friends, and co-workers that you are going to quit and need their support. Ask them not to smoke  around you.  Get individual, group, or telephone counseling and support. Programs are available at [employer] and health centers. Call your local health department for information about programs in your area.  Spiritual beliefs and practices may help some smokers quit.  Download a "quit meter" on your computer to keep track of quit statistics, such as how long you have gone without smoking, cigarettes not smoked, and money saved.  Get a self-help book about quitting smoking and staying off of tobacco. LEARN NEW SKILLS AND BEHAVIORS  Distract yourself from urges to smoke. Talk to someone, go for a walk, or occupy your time with a task.  Change your normal routine. Take a different route to work. Drink tea instead of coffee. Eat breakfast in a different place.  Reduce your stress. Take a hot bath, exercise, or read a book.  Plan something enjoyable to do every day. Reward yourself for not smoking.  Explore interactive web-based programs that specialize in helping you quit. GET MEDICINE AND USE IT CORRECTLY Medicines can help you stop smoking and decrease the urge to smoke. Combining medicine with the above behavioral methods and support can greatly increase your chances of successfully quitting smoking.  Nicotine replacement therapy helps deliver nicotine to your body without the negative effects and risks of smoking. Nicotine replacement therapy includes nicotine gum, lozenges, inhalers, nasal sprays, and skin patches. Some may be available over-the-counter and others require a prescription.  Antidepressant medicine helps people abstain from smoking, but how this works is unknown. This medicine is available by prescription.  Nicotinic receptor partial agonist medicine simulates the effect of nicotine in your brain. This medicine is available by prescription. Ask your caregiver for advice about which medicines to use and how to use them based on your health history. Your caregiver will  tell you what side effects to look out for if you choose to be on a medicine or therapy. Carefully read the information on the package. Do not use any other product containing nicotine while using a nicotine replacement product.  RELAPSE OR DIFFICULT SITUATIONS Most relapses occur within the first 3 months after quitting. Do not be discouraged if you start smoking again. Remember, most people try several times before finally quitting. You may have symptoms of withdrawal because your body is used to nicotine. You may crave cigarettes, be irritable, feel very hungry, cough often, get headaches, or have difficulty concentrating. The withdrawal symptoms are only temporary. They are strongest when you first quit, but they will go away within 10 14 days. To reduce the chances of relapse, try to:  Avoid drinking alcohol. Drinking lowers your chances of successfully quitting.  Reduce the amount of caffeine you consume. Once you quit smoking, the amount of caffeine in your body increases and can give you symptoms, such as a rapid heartbeat, sweating, and anxiety.  Avoid smokers because they can make you want to smoke.  Do not let weight gain distract  you. Many smokers will gain weight when they quit, usually less than 10 pounds. Eat a healthy diet and stay active. You can always lose the weight gained after you quit.  Find ways to improve your mood other than smoking. FOR MORE INFORMATION  www.smokefree.gov  Document Released: 09/10/2001 Document Revised: 03/17/2012 Document Reviewed: 12/26/2011 [facility] Patient Information 2013 [place], [place].

## 2013-02-09 NOTE — Assessment & Plan Note (Signed)
Chronic cough - ready for tobacco cessation - see below chantix rx - we reviewed potential risk/benefit and possible side effects - pt understands and agrees to same  follow up 6-12 weeks on progress, consider wellbutrin if needed Refill prn [facility]

## 2013-02-09 NOTE — Assessment & Plan Note (Signed)
15 minutes today spent counseling patient on unhealthy effects of continued tobacco abuse and encouragement of cessation including medical options available to help the patient quit smoking. chantix and support provided

## 2013-02-09 NOTE — Progress Notes (Signed)
  Subjective:    Patient ID: Jessica [name], female    DOB: 1951-01-29, 62 y.o.   MRN: [id number]  HPI  patient is here today to discuss smoking cessation -  Also reviewed CPX labs 01/2012: high chol  Past Medical History  Diagnosis Date  . GERD (gastroesophageal reflux disease)   . COPD (chronic obstructive pulmonary disease)     infreq exac, continued tobacco abuse  . Osteoarthritis of knee     R knee  . Dyslipidemia      Review of Systems  Cardiovascular: Negative for chest pain or palpitations.  Gastrointestinal: Negative for abdominal pain, no bowel changes.  Neurological: Negative for dizziness or headache.      Objective:   Physical Exam  BP 112/72  Pulse 66  Temp(Src) 98.4 F (36.9 C) (Oral)  Wt 156 lb 1.9 oz (70.816 kg)  BMI 28.55 kg/m2  SpO2 97% Wt Readings from Last 3 Encounters:  02/09/13 156 lb 1.9 oz (70.816 kg)  08/10/12 159 lb 6.4 oz (72.303 kg)  04/29/12 157 lb (71.215 kg)   Constitutional: She appears well-developed and well-nourished. No distress. hoarse smokers voice Neck: Normal range of motion. Neck supple. No JVD present. No thyromegaly present.  Cardiovascular: Normal rate, regular rhythm and normal heart sounds.  No murmur heard. No BLE edema. Pulmonary/Chest: Effort normal and breath sounds normal. No respiratory distress. She has no wheezes.  Psychiatric: She has a normal mood and affect. Her behavior is normal. Judgment and thought content normal.   Lab Results  Component Value Date   WBC 7.0 02/21/2012   HGB 14.0 02/21/2012   HCT 41.5 02/21/2012   PLT 185.0 02/21/2012   GLUCOSE 80 02/21/2012   CHOL 250* 02/21/2012   TRIG 179.0* 02/21/2012   HDL 40.20 02/21/2012   LDLDIRECT 188.4 02/21/2012   ALT 21 02/21/2012   AST 22 02/21/2012   NA 141 02/21/2012   K 4.0 02/21/2012   CL 106 02/21/2012   CREATININE 0.8 02/21/2012   BUN 20 02/21/2012   CO2 29 02/21/2012   TSH 1.99 02/21/2012       Assessment & Plan:   See problem list. Medications  and labs reviewed today.

## 2013-02-18 ENCOUNTER — Telehealth: Payer: Self-pay | Admitting: *Deleted

## 2013-02-18 MED ORDER — VARENICLINE TARTRATE 0.5 MG X 11 & 1 MG X 42 PO MISC: ORAL | Status: DC

## 2013-02-18 NOTE — Telephone Encounter (Signed)
[facility]. Called pt back inform her rx was sent but [facility].../lmb

## 2013-04-16 ENCOUNTER — Ambulatory Visit (INDEPENDENT_AMBULATORY_CARE_PROVIDER_SITE_OTHER): Payer: BC Managed Care – PPO | Admitting: Internal Medicine

## 2013-04-16 ENCOUNTER — Other Ambulatory Visit (INDEPENDENT_AMBULATORY_CARE_PROVIDER_SITE_OTHER): Payer: BC Managed Care – PPO

## 2013-04-16 ENCOUNTER — Encounter: Payer: Self-pay | Admitting: Internal Medicine

## 2013-04-16 VITALS — BP 110/72 | HR 66 | Temp 98.1°F | Ht 62.0 in | Wt 156.4 lb

## 2013-04-16 DIAGNOSIS — F17200 Nicotine dependence, unspecified, uncomplicated: Secondary | ICD-10-CM

## 2013-04-16 DIAGNOSIS — E785 Hyperlipidemia, unspecified: Secondary | ICD-10-CM

## 2013-04-16 DIAGNOSIS — Z1211 Encounter for screening for malignant neoplasm of colon: Secondary | ICD-10-CM

## 2013-04-16 DIAGNOSIS — Z0000 Encounter for general adult medical examination without abnormal findings: Secondary | ICD-10-CM

## 2013-04-16 DIAGNOSIS — Z720 Tobacco use: Secondary | ICD-10-CM

## 2013-04-16 DIAGNOSIS — Z136 Encounter for screening for cardiovascular disorders: Secondary | ICD-10-CM

## 2013-04-16 LAB — CBC WITH DIFFERENTIAL/PLATELET
Basophils Absolute: 0 10*3/uL (ref 0.0–0.1)
Basophils Relative: 0.5 % (ref 0.0–3.0)
Eosinophils Absolute: 0.2 10*3/uL (ref 0.0–0.7)
Lymphocytes Relative: 38.3 % (ref 12.0–46.0)
MCHC: 34.4 g/dL (ref 30.0–36.0)
Neutrophils Relative %: 49.5 % (ref 43.0–77.0)
RBC: 4.7 Mil/uL (ref 3.87–5.11)

## 2013-04-16 LAB — LIPID PANEL
Cholesterol: 262 mg/dL — ABNORMAL HIGH (ref 0–200)
HDL: 40.2 mg/dL (ref >39.00)
Total CHOL/HDL Ratio: 7
Triglycerides: 233 mg/dL — ABNORMAL HIGH (ref 0.0–149.0)
VLDL: 46.6 mg/dL — ABNORMAL HIGH (ref 0.0–40.0)

## 2013-04-16 LAB — URINALYSIS, ROUTINE W REFLEX MICROSCOPIC
Nitrite: NEGATIVE
Total Protein, Urine: NEGATIVE
Urobilinogen, UA: 0.2 (ref 0.0–1.0)

## 2013-04-16 LAB — HEPATIC FUNCTION PANEL
ALT: 34 U/L (ref 0–35)
Total Protein: 7.5 g/dL (ref 6.0–8.3)

## 2013-04-16 LAB — LDL CHOLESTEROL, DIRECT: Direct LDL: 191.6 mg/dL

## 2013-04-16 LAB — BASIC METABOLIC PANEL
CO2: 28 mEq/L (ref 19–32)
Calcium: 9.3 mg/dL (ref 8.4–10.5)
Creatinine, Ser: 1 mg/dL (ref 0.4–1.2)

## 2013-04-16 MED ORDER — ATORVASTATIN CALCIUM 20 MG PO TABS: 20.0000 mg | ORAL_TABLET | Freq: Every day | ORAL | Status: DC

## 2013-04-16 MED ORDER — VARENICLINE TARTRATE 1 MG PO TABS: 1.0000 mg | ORAL_TABLET | Freq: Two times a day (BID) | ORAL | Status: DC

## 2013-04-16 NOTE — Assessment & Plan Note (Signed)
5 minutes today spent counseling patient on unhealthy effects of tobacco abuse and encouragement of cessation including medical options available to help the patient quit smoking. Continue chantix and support provided

## 2013-04-16 NOTE — Patient Instructions (Addendum)
It was good to see you today. We have reviewed your interval medical history today Health Maintenance reviewed - all recommended immunizations and age-appropriate screenings are up-to-date. Test(s) done today. Your results will be called to you after review (48-72hours after test completion). If any changes need to be made, you will be notified at that time. we'll make referral for colonoscopy. Our office will contact you regarding appointment(s) once made. Medications reviewed, no changes at this time.  Please schedule followup in 6-12 months, call sooner if problems.  Health Maintenance, Females A healthy lifestyle and preventative care can promote health and wellness.  Maintain regular health, dental, and eye exams.  Eat a healthy diet. Foods like vegetables, fruits, whole grains, low-fat dairy products, and lean protein foods contain the nutrients you need without too many calories. Decrease your intake of foods high in solid fats, added sugars, and salt. Get information about a proper diet from your caregiver, if necessary.  Regular physical exercise is one of the most important things you can do for your health. Most adults should get at least 150 minutes of moderate-intensity exercise (any activity that increases your heart rate and causes you to sweat) each week. In addition, most adults need muscle-strengthening exercises on 2 or more days a week.   Maintain a healthy weight. The body mass index (BMI) is a screening tool to identify possible weight problems. It provides an estimate of body fat based on height and weight. Your caregiver can help determine your BMI, and can help you achieve or maintain a healthy weight. For adults 20 years and older:  A BMI below 18.5 is considered underweight.  A BMI of 18.5 to 24.9 is normal.  A BMI of 25 to 29.9 is considered overweight.  A BMI of 30 and above is considered obese.  Maintain normal blood lipids and cholesterol by exercising and  minimizing your intake of saturated fat. Eat a balanced diet with plenty of fruits and vegetables. Blood tests for lipids and cholesterol should begin at age 76 and be repeated every 5 years. If your lipid or cholesterol levels are high, you are over 50, or you are a high risk for heart disease, you may need your cholesterol levels checked more frequently.Ongoing high lipid and cholesterol levels should be treated with medicines if diet and exercise are not effective.  If you smoke, find out from your caregiver how to quit. If you do not use tobacco, do not start.  If you are pregnant, do not drink alcohol. If you are breastfeeding, be very cautious about drinking alcohol. If you are not pregnant and choose to drink alcohol, do not exceed 1 drink per day. One drink is considered to be 12 ounces (355 mL) of beer, 5 ounces (148 mL) of wine, or 1.5 ounces (44 mL) of liquor.  Avoid use of street drugs. Do not share needles with anyone. Ask for help if you need support or instructions about stopping the use of drugs.  High blood pressure causes heart disease and increases the risk of stroke. Blood pressure should be checked at least every 1 to 2 years. Ongoing high blood pressure should be treated with medicines, if weight loss and exercise are not effective.  If you are 4 to 65 years old, ask your caregiver if you should take aspirin to prevent strokes.  Diabetes screening involves taking a blood sample to check your fasting blood sugar level. This should be done once every 3 years, after age 34, if  you are within normal weight and without risk factors for diabetes. Testing should be considered at a younger age or be carried out more frequently if you are overweight and have at least 1 risk factor for diabetes.  Breast cancer screening is essential preventative care for women. You should practice "breast self-awareness." This means understanding the normal appearance and feel of your breasts and may  include breast self-examination. Any changes detected, no matter how small, should be reported to a caregiver. Women in their 1s and 30s should have a clinical breast exam (CBE) by a caregiver as part of a regular health exam every 1 to 3 years. After age 44, women should have a CBE every year. Starting at age 28, women should consider having a mammogram (breast X-ray) every year. Women who have a family history of breast cancer should talk to their caregiver about genetic screening. Women at a high risk of breast cancer should talk to their caregiver about having an MRI and a mammogram every year.  The Pap test is a screening test for cervical cancer. Women should have a Pap test starting at age 62. Between ages 48 and 38, Pap tests should be repeated every 2 years. Beginning at age 16, you should have a Pap test every 3 years as long as the past 3 Pap tests have been normal. If you had a hysterectomy for a problem that was not cancer or a condition that could lead to cancer, then you no longer need Pap tests. If you are between ages 23 and 4, and you have had normal Pap tests going back 10 years, you no longer need Pap tests. If you have had past treatment for cervical cancer or a condition that could lead to cancer, you need Pap tests and screening for cancer for at least 20 years after your treatment. If Pap tests have been discontinued, risk factors (such as a new sexual partner) need to be reassessed to determine if screening should be resumed. Some women have medical problems that increase the chance of getting cervical cancer. In these cases, your caregiver may recommend more frequent screening and Pap tests.  The human papillomavirus (HPV) test is an additional test that may be used for cervical cancer screening. The HPV test looks for the virus that can cause the cell changes on the cervix. The cells collected during the Pap test can be tested for HPV. The HPV test could be used to screen women aged  89 years and older, and should be used in women of any age who have unclear Pap test results. After the age of 37, women should have HPV testing at the same frequency as a Pap test.  Colorectal cancer can be detected and often prevented. Most routine colorectal cancer screening begins at the age of 29 and continues through age 49. However, your caregiver may recommend screening at an earlier age if you have risk factors for colon cancer. On a yearly basis, your caregiver may provide home test kits to check for hidden blood in the stool. Use of a small camera at the end of a tube, to directly examine the colon (sigmoidoscopy or colonoscopy), can detect the earliest forms of colorectal cancer. Talk to your caregiver about this at age 45, when routine screening begins. Direct examination of the colon should be repeated every 5 to 10 years through age 38, unless early forms of pre-cancerous polyps or small growths are found.  Hepatitis C blood testing is recommended for all  people born from [birth year] through 1967 and any individual with known risks for hepatitis C.  Practice safe sex. Use condoms and avoid high-risk sexual practices to reduce the spread of sexually transmitted infections (STIs). Sexually active women aged 34 and younger should be checked for Chlamydia, which is a common sexually transmitted infection. Older women with new or multiple partners should also be tested for Chlamydia. Testing for other STIs is recommended if you are sexually active and at increased risk.  Osteoporosis is a disease in which the bones lose minerals and strength with aging. This can result in serious bone fractures. The risk of osteoporosis can be identified using a bone density scan. Women ages 49 and over and women at risk for fractures or osteoporosis should discuss screening with their caregivers. Ask your caregiver whether you should be taking a calcium supplement or vitamin D to reduce the rate of  osteoporosis.  Menopause can be associated with physical symptoms and risks. Hormone replacement therapy is available to decrease symptoms and risks. You should talk to your caregiver about whether hormone replacement therapy is right for you.  Use sunscreen with a sun protection factor (SPF) of 30 or greater. Apply sunscreen liberally and repeatedly throughout the day. You should seek shade when your shadow is shorter than you. Protect yourself by wearing long sleeves, pants, a wide-brimmed hat, and sunglasses year round, whenever you are outdoors.  Notify your caregiver of new moles or changes in moles, especially if there is a change in shape or color. Also notify your caregiver if a mole is larger than the size of a pencil eraser.  Stay current with your immunizations. Document Released: 04/01/2011 Document Revised: 12/09/2011 Document Reviewed: 04/01/2011 [facility] Patient Information 2014 [place], [place].

## 2013-04-16 NOTE — Addendum Note (Signed)
Addended by: Rene Paci A on: 04/16/2013 02:08 PM   Modules accepted: Orders

## 2013-04-16 NOTE — Assessment & Plan Note (Signed)
LDL remains greater than 160 on CPX 03/2013 Recommend to begin atorvastatin 20 mg each night - ex done

## 2013-04-16 NOTE — Progress Notes (Signed)
Subjective:    Patient ID: Jessica [name], female    DOB: 21-Mar-1951, 62 y.o.   MRN: [id number]  HPI  patient is here today for annual physical. Patient feels well and has no complaints.  Past Medical History  Diagnosis Date  . GERD (gastroesophageal reflux disease)   . COPD (chronic obstructive pulmonary disease)     infreq exac, continued tobacco abuse  . Osteoarthritis of knee     R knee  . Dyslipidemia    Family History  Problem Relation Age of Onset  . Breast cancer Maternal Aunt   . Hypertension Mother   . Stroke Other   . Heart disease Mother   . Hypertension Brother   . Multiple sclerosis Father    History  Substance Use Topics  . Smoking status: Current Every Day Smoker  . Smokeless tobacco: Not on file  . Alcohol Use: Yes     Comment: Infrequently      Review of Systems  Constitutional: Negative for fever or weight change.  Respiratory: Negative for cough and shortness of breath.   Cardiovascular: Negative for chest pain or palpitations.  Gastrointestinal: Negative for abdominal pain, no bowel changes.  Musculoskeletal: Negative for gait problem or joint swelling.  Skin: Negative for rash.  Neurological: Negative for dizziness or headache.  No other specific complaints in a complete review of systems (except as listed in HPI above).     Objective:   Physical Exam  BP 110/72  Pulse 66  Temp(Src) 98.1 F (36.7 C) (Oral)  Ht 5\' 2"  (1.575 m)  Wt 156 lb 6.4 oz (70.943 kg)  BMI 28.6 kg/m2  SpO2 96% Wt Readings from Last 3 Encounters:  04/16/13 156 lb 6.4 oz (70.943 kg)  02/09/13 156 lb 1.9 oz (70.816 kg)  08/10/12 159 lb 6.4 oz (72.303 kg)   Constitutional: She appears well-developed and well-nourished. No distress.  HENT: Head: Normocephalic and atraumatic. Ears: B TMs ok, no erythema or effusion; Nose: Nose normal. Mouth/Throat: Oropharynx is clear and moist. No oropharyngeal exudate.  Eyes: Conjunctivae and EOM are normal. Pupils are equal,  round, and reactive to light. No scleral icterus.  Neck: Normal range of motion. Neck supple. No JVD present. No thyromegaly present.  Cardiovascular: Normal rate, regular rhythm and normal heart sounds.  No murmur heard. No BLE edema. Pulmonary/Chest: Effort normal and breath sounds normal. No respiratory distress. She has no wheezes.  Abdominal: Soft. Bowel sounds are normal. She exhibits no distension. There is no tenderness. no masses Musculoskeletal: Normal range of motion, no joint effusions. No gross deformities Neurological: She is alert and oriented to person, place, and time. No cranial nerve deficit. Coordination normal.  Skin: Skin is warm and dry. No rash noted. No erythema.  Psychiatric: She has a normal mood and affect. Her behavior is normal. Judgment and thought content normal.   Lab Results  Component Value Date   WBC 7.0 02/21/2012   HGB 14.0 02/21/2012   HCT 41.5 02/21/2012   PLT 185.0 02/21/2012   GLUCOSE 80 02/21/2012   CHOL 250* 02/21/2012   TRIG 179.0* 02/21/2012   HDL 40.20 02/21/2012   LDLDIRECT 188.4 02/21/2012   ALT 21 02/21/2012   AST 22 02/21/2012   NA 141 02/21/2012   K 4.0 02/21/2012   CL 106 02/21/2012   CREATININE 0.8 02/21/2012   BUN 20 02/21/2012   CO2 29 02/21/2012   TSH 1.99 02/21/2012   ECG: NSR @64 , no ischemic change or arrhythmia  Assessment & Plan:   CPX/v70.0 - Patient has been counseled on age-appropriate routine health concerns for screening and prevention. These are reviewed and up-to-date. Immunizations are up-to-date or declined. Labs ordered and will be reviewed.

## 2013-05-04 ENCOUNTER — Other Ambulatory Visit (INDEPENDENT_AMBULATORY_CARE_PROVIDER_SITE_OTHER): Payer: BC Managed Care – PPO

## 2013-05-04 ENCOUNTER — Ambulatory Visit (INDEPENDENT_AMBULATORY_CARE_PROVIDER_SITE_OTHER): Payer: BC Managed Care – PPO | Admitting: Internal Medicine

## 2013-05-04 ENCOUNTER — Encounter: Payer: Self-pay | Admitting: Internal Medicine

## 2013-05-04 VITALS — BP 120/70 | HR 66 | Temp 97.7°F | Wt 161.8 lb

## 2013-05-04 DIAGNOSIS — T8062XA Other serum reaction due to vaccination, initial encounter: Secondary | ICD-10-CM

## 2013-05-04 DIAGNOSIS — M79609 Pain in unspecified limb: Secondary | ICD-10-CM

## 2013-05-04 DIAGNOSIS — M79601 Pain in right arm: Secondary | ICD-10-CM

## 2013-05-04 DIAGNOSIS — T881XXA Other complications following immunization, not elsewhere classified, initial encounter: Secondary | ICD-10-CM

## 2013-05-04 LAB — CBC WITH DIFFERENTIAL/PLATELET
Basophils Absolute: 0 10*3/uL (ref 0.0–0.1)
Eosinophils Absolute: 0.1 10*3/uL (ref 0.0–0.7)
Lymphocytes Relative: 20.9 % (ref 12.0–46.0)
MCHC: 33.9 g/dL (ref 30.0–36.0)
Neutrophils Relative %: 69.1 % (ref 43.0–77.0)
Platelets: 207 10*3/uL (ref 150.0–400.0)
RDW: 13 % (ref 11.5–14.6)

## 2013-05-04 MED ORDER — GABAPENTIN 300 MG PO CAPS: 300.0000 mg | ORAL_CAPSULE | Freq: Every day | ORAL | Status: DC

## 2013-05-04 NOTE — Progress Notes (Signed)
Subjective:    Patient ID: Jessica [name], female    DOB: [birth year], 62 y.o.   MRN: [id number]  HPI Patient presents today with pain in her right upper arm x 2-3 months. She had some occasional soreness in her lateral upper arm since she got the Zostavax (02/09/13), but over the past week, the pain has progressed to a constant burning. She has occasional sharp shooting pain and a squeezing sensation of the muscle. This pain occurs randomly; it could be while she is resting or while she is using her arm during her karate sessions. This morning, she noted small bruises of the interior aspect of her right upper arm.  She does not recall injuring or bumping her arm at any time, but she did just return from a trip during which she swam, and she has continued with karate lessons. There is no pain or swelling in her shoulder joint or elbow joint. She has no limitations of ROM of her shoulder or elbow, and she has no limitations in her activity. She has not noticed swelling, redness or irritation to the overlying skin. She denies any fever and chills.   Past Medical History  Diagnosis Date  . GERD (gastroesophageal reflux disease)   . COPD (chronic obstructive pulmonary disease)     infreq exac, continued tobacco abuse  . Osteoarthritis of knee     R knee  . Dyslipidemia    Family History  Problem Relation Age of Onset  . Breast cancer Maternal Aunt   . Hypertension Mother   . Stroke Other   . Heart disease Mother   . Hypertension Brother   . Multiple sclerosis Father    History  Substance Use Topics  . Smoking status: Current Every Day Smoker  . Smokeless tobacco: Not on file  . Alcohol Use: Yes     Comment: Infrequently       Review of Systems  Constitutional: Negative for activity change, fatigue and unexpected weight change.  Respiratory: Negative for shortness of breath.   Skin: Negative for rash.  Neurological: Negative for weakness.  Otherwise negative or as stated in  HPI.      Objective:   Physical Exam  Constitutional: She is oriented to person, place, and time. She appears well-developed and well-nourished.  HENT:  Head: Normocephalic and atraumatic.  Eyes: Conjunctivae and EOM are normal. Pupils are equal, round, and reactive to light.  Musculoskeletal:  ROM of shoulder, elbow, and wrist are full and equal bilaterally. Mild shooting pain was elicited in the upper arm on arm abduction and shoulder extension.  Grip strength, upper arm and forearm strength are 4/4 and equal bilaterally. Mild edema, warmth, and tendernesswas noted over the upper right tricep. The same muscle was noted to be tight. Area of small ecchymoses ranging from pinpoint-1cm in size were noted on interior aspect of right upper arm.  Neurological: She is alert and oriented to person, place, and time.  Skin: Skin is warm and dry. No rash noted.  Psychiatric: She has a normal mood and affect.    Lab Results  Component Value Date   WBC 7.6 04/16/2013   HGB 14.2 04/16/2013   HCT 41.4 04/16/2013   PLT 201.0 04/16/2013   GLUCOSE 85 04/16/2013   CHOL 262* 04/16/2013   TRIG 233.0* 04/16/2013   HDL 40.20 04/16/2013   LDLDIRECT 191.6 04/16/2013   ALT 34 04/16/2013   AST 26 04/16/2013   NA 139 04/16/2013   K 4.3 04/16/2013  CL 103 04/16/2013   CREATININE 1.0 04/16/2013   BUN 26* 04/16/2013   CO2 28 04/16/2013   TSH 5.13 04/16/2013        Assessment & Plan:  1. Pain of right lateral upper arm in tricep region-  R RTC intact and no abnormality of R elbow. No clear trauma or injury but suspect delayed reaction to Zostavax (01/2013 at same site) vs overuse/injury (karate). Labs will be obtained including CK to assess for muscle damage and CBC to assess for infection. Patient is advised to used tylenol prn for pain control, and gabapentin is also prescribed for additional pain control if the tylenol is not enough. She is advised to rest her arm over the next 1-2 weeks (avoid martial arts  practice). Pending results of lab tests, if continued pain or unimproved with conservative care, she will be referred to ortho for further evaluation/ treatment.  Concha Se, [place] [facility]   I have personally reviewed this case with PA student. I also personally examined this patient. I agree with history and findings as documented above. I reviewed, discussed and approve of the assessment and plan as listed above. Rene Paci, MD

## 2013-05-04 NOTE — Patient Instructions (Signed)
It was good to see you today. Immunization reactions can take several weeks or months to resolve - continue applying ice (or heat), use Tylenol and start gabapentin at bedtime for pain Your prescription(s) have been submitted to your pharmacy. Please take as directed and contact our office if you believe you are having problem(s) with the medication(s). Test(s) ordered today. Your results will be released to MyChart (or called to you) after review, usually within 72hours after test completion. If any changes need to be made, you will be notified at that same time. If still unimproved with normal labs, I will refer to orthopedic specialist as we discussed

## 2013-05-06 ENCOUNTER — Encounter: Payer: Self-pay | Admitting: Internal Medicine

## 2013-05-09 ENCOUNTER — Encounter: Payer: Self-pay | Admitting: Internal Medicine

## 2013-05-09 DIAGNOSIS — M79601 Pain in right arm: Secondary | ICD-10-CM

## 2013-05-09 DIAGNOSIS — M25511 Pain in right shoulder: Secondary | ICD-10-CM

## 2013-06-23 ENCOUNTER — Encounter: Payer: Self-pay | Admitting: Internal Medicine

## 2013-08-05 ENCOUNTER — Other Ambulatory Visit: Payer: Self-pay

## 2013-09-30 DIAGNOSIS — M19011 Primary osteoarthritis, right shoulder: Secondary | ICD-10-CM

## 2013-09-30 HISTORY — PX: ROTATOR CUFF REPAIR: SHX139

## 2013-09-30 HISTORY — DX: Primary osteoarthritis, right shoulder: M19.011

## 2014-08-05 ENCOUNTER — Encounter: Payer: Self-pay | Admitting: Internal Medicine

## 2014-08-05 ENCOUNTER — Ambulatory Visit (INDEPENDENT_AMBULATORY_CARE_PROVIDER_SITE_OTHER): Payer: BC Managed Care – PPO | Admitting: Internal Medicine

## 2014-08-05 VITALS — BP 110/66 | HR 69 | Temp 98.6°F | Resp 18 | Ht 62.0 in | Wt 153.8 lb

## 2014-08-05 DIAGNOSIS — Z720 Tobacco use: Secondary | ICD-10-CM

## 2014-08-05 DIAGNOSIS — J449 Chronic obstructive pulmonary disease, unspecified: Secondary | ICD-10-CM

## 2014-08-05 MED ORDER — AMOXICILLIN-POT CLAVULANATE 875-125 MG PO TABS: 1.0000 | ORAL_TABLET | Freq: Two times a day (BID) | ORAL | Status: DC

## 2014-08-05 MED ORDER — FLUTICASONE PROPIONATE 50 MCG/ACT NA SUSP: 2.0000 | Freq: Every day | NASAL | Status: DC

## 2014-08-05 NOTE — Patient Instructions (Addendum)
We will have you taken antibiotic called in Augmentin 1 pill twice a day for 10 days to clear up your sinuses. The other thing that may help to clear up your sinuses is a nose spray called Flonase. Take 2 puffs on each nostril once a day to help clear up the drainage and dry up your sinuses.  If you have any shortness of breath, continued fevers, this does not get better please call our office back.  Sinusitis Sinusitis is redness, soreness, and inflammation of the paranasal sinuses. Paranasal sinuses are air pockets within the bones of your face (beneath the eyes, the middle of the forehead, or above the eyes). In healthy paranasal sinuses, mucus is able to drain out, and air is able to circulate through them by way of your nose. However, when your paranasal sinuses are inflamed, mucus and air can become trapped. This can allow bacteria and other germs to grow and cause infection. Sinusitis can develop quickly and last only a short time (acute) or continue over a long period (chronic). Sinusitis that lasts for more than 12 weeks is considered chronic.  CAUSES  Causes of sinusitis include:  Allergies.  Structural abnormalities, such as displacement of the cartilage that separates your nostrils (deviated septum), which can decrease the air flow through your nose and sinuses and affect sinus drainage.  Functional abnormalities, such as when the small hairs (cilia) that line your sinuses and help remove mucus do not work properly or are not present. SIGNS AND SYMPTOMS  Symptoms of acute and chronic sinusitis are the same. The primary symptoms are pain and pressure around the affected sinuses. Other symptoms include:  Upper toothache.  Earache.  Headache.  Bad breath.  Decreased sense of smell and taste.  A cough, which worsens when you are lying flat.  Fatigue.  Fever.  Thick drainage from your nose, which often is green and may contain pus (purulent).  Swelling and warmth over the  affected sinuses. DIAGNOSIS  Your health care provider will perform a physical exam. During the exam, your health care provider may:  Look in your nose for signs of abnormal growths in your nostrils (nasal polyps).  Tap over the affected sinus to check for signs of infection.  View the inside of your sinuses (endoscopy) using an imaging device that has a light attached (endoscope). If your health care provider suspects that you have chronic sinusitis, one or more of the following tests may be recommended:  Allergy tests.  Nasal culture. A sample of mucus is taken from your nose, sent to a lab, and screened for bacteria.  Nasal cytology. A sample of mucus is taken from your nose and examined by your health care provider to determine if your sinusitis is related to an allergy. TREATMENT  Most cases of acute sinusitis are related to a viral infection and will resolve on their own within 10 days. Sometimes medicines are prescribed to help relieve symptoms (pain medicine, decongestants, nasal steroid sprays, or saline sprays).  However, for sinusitis related to a bacterial infection, your health care provider will prescribe antibiotic medicines. These are medicines that will help kill the bacteria causing the infection.  Rarely, sinusitis is caused by a fungal infection. In theses cases, your health care provider will prescribe antifungal medicine. For some cases of chronic sinusitis, surgery is needed. Generally, these are cases in which sinusitis recurs more than 3 times per year, despite other treatments. HOME CARE INSTRUCTIONS   Drink plenty of water. Water helps  thin the mucus so your sinuses can drain more easily.  Use a humidifier.  Inhale steam 3 to 4 times a day (for example, sit in the bathroom with the shower running).  Apply a warm, moist washcloth to your face 3 to 4 times a day, or as directed by your health care provider.  Use saline nasal sprays to help moisten and clean  your sinuses.  Take medicines only as directed by your health care provider.  If you were prescribed either an antibiotic or antifungal medicine, finish it all even if you start to feel better. SEEK IMMEDIATE MEDICAL CARE IF:  You have increasing pain or severe headaches.  You have nausea, vomiting, or drowsiness.  You have swelling around your face.  You have vision problems.  You have a stiff neck.  You have difficulty breathing. MAKE SURE YOU:   Understand these instructions.  Will watch your condition.  Will get help right away if you are not doing well or get worse. Document Released: 09/16/2005 Document Revised: 01/31/2014 Document Reviewed: 10/01/2011 [facility] Patient Information 2015 [place], [place]. This information is not intended to replace advice given to you by your health care provider. Make sure you discuss any questions you have with your health care provider.

## 2014-08-05 NOTE — Progress Notes (Signed)
Pre visit review using our clinic review tool, if applicable. No additional management support is needed unless otherwise documented below in the visit note. 

## 2014-08-08 NOTE — Progress Notes (Signed)
   Subjective:    Patient ID: Jessica [name], female    DOB: [birth year], 63 y.o.   MRN: [id number]  HPI The patient is coming in for an acute visit for sinusitis. She states that she has had congestion and pain in her sinuses for 4-5 days. She states that she has had pressure in her ears, face. She's also had headaches. She's had purulent discharge from her nostrils and is having a cough. She states she had some chills last night although has not taken her temperature. She does also have concurrent COPD and is concerned that if this is not treated quickly she may have bronchitis from it.  Review of Systems  Constitutional: Positive for chills, activity change and appetite change. Negative for fever, fatigue and unexpected weight change.  HENT: Positive for congestion, ear pain, facial swelling, postnasal drip and sinus pressure.   Respiratory: Positive for cough. Negative for chest tightness, shortness of breath and wheezing.   Cardiovascular: Negative for chest pain, palpitations and leg swelling.  Gastrointestinal: Negative for nausea, abdominal pain, diarrhea, constipation and abdominal distention.  Neurological: Negative.       Objective:   Physical Exam  Constitutional: She appears well-developed and well-nourished.  HENT:  Head: Normocephalic and atraumatic.  Right Ear: External ear normal.  Left Ear: External ear normal.  Nostrils with yellow mucus crusting. Oropharynx with some mild drainage at the back with mild erythema. No purulent drainage. Tenderness to pressure over the frontal sinuses.  Eyes: EOM are normal.  Neck: Normal range of motion. Neck supple.  Cardiovascular: Normal rate and regular rhythm.   Pulmonary/Chest: Effort normal and breath sounds normal.  Abdominal: Soft. Bowel sounds are normal.  Lymphadenopathy:    She has no cervical adenopathy.   Filed Vitals:   08/05/14 1528  BP: 110/66  Pulse: 69  Temp: 98.6 F (37 C)  TempSrc: Oral  Resp: 18  Height:  5\' 2"  (1.575 m)  Weight: 153 lb 12.8 oz (69.763 kg)  SpO2: 97%      Assessment & Plan:

## 2014-08-08 NOTE — Assessment & Plan Note (Signed)
We'll treat for her sinus infection aggressively given concomitant COPD. Augmentin 1 twice a day for 10 days as well as Flonase 2 puffs each nostril daily.

## 2014-08-08 NOTE — Assessment & Plan Note (Signed)
Advised that her current smoking habits are likely not helping her sinuses as well as her breathing. Encouraged cessation.

## 2014-09-14 ENCOUNTER — Encounter (HOSPITAL_BASED_OUTPATIENT_CLINIC_OR_DEPARTMENT_OTHER): Payer: Self-pay | Admitting: *Deleted

## 2014-09-14 NOTE — Progress Notes (Signed)
No labs needed-to bring all meds and overnight bag in case she has to stay

## 2014-09-15 ENCOUNTER — Other Ambulatory Visit: Payer: Self-pay | Admitting: Orthopedic Surgery

## 2014-09-16 ENCOUNTER — Ambulatory Visit (HOSPITAL_BASED_OUTPATIENT_CLINIC_OR_DEPARTMENT_OTHER): Payer: BC Managed Care – PPO | Admitting: Anesthesiology

## 2014-09-16 ENCOUNTER — Encounter (HOSPITAL_BASED_OUTPATIENT_CLINIC_OR_DEPARTMENT_OTHER)
Admission: RE | Disposition: A | Discharge status: Home / Self Care | Payer: Self-pay | Source: Ambulatory Visit | Attending: Orthopedic Surgery

## 2014-09-16 ENCOUNTER — Ambulatory Visit (HOSPITAL_BASED_OUTPATIENT_CLINIC_OR_DEPARTMENT_OTHER)
Admission: RE | Admit: 2014-09-16 | Discharge: 2014-09-16 | Disposition: A | Discharge status: Home / Self Care | Payer: BC Managed Care – PPO | Source: Ambulatory Visit | Attending: Orthopedic Surgery | Admitting: Orthopedic Surgery

## 2014-09-16 ENCOUNTER — Encounter (HOSPITAL_BASED_OUTPATIENT_CLINIC_OR_DEPARTMENT_OTHER): Payer: Self-pay | Admitting: Anesthesiology

## 2014-09-16 DIAGNOSIS — M7542 Impingement syndrome of left shoulder: Secondary | ICD-10-CM | POA: Diagnosis present

## 2014-09-16 DIAGNOSIS — M19012 Primary osteoarthritis, left shoulder: Secondary | ICD-10-CM | POA: Diagnosis present

## 2014-09-16 DIAGNOSIS — Z803 Family history of malignant neoplasm of breast: Secondary | ICD-10-CM | POA: Diagnosis not present

## 2014-09-16 DIAGNOSIS — K219 Gastro-esophageal reflux disease without esophagitis: Secondary | ICD-10-CM | POA: Insufficient documentation

## 2014-09-16 DIAGNOSIS — E785 Hyperlipidemia, unspecified: Secondary | ICD-10-CM | POA: Insufficient documentation

## 2014-09-16 DIAGNOSIS — J449 Chronic obstructive pulmonary disease, unspecified: Secondary | ICD-10-CM | POA: Insufficient documentation

## 2014-09-16 DIAGNOSIS — F17210 Nicotine dependence, cigarettes, uncomplicated: Secondary | ICD-10-CM | POA: Insufficient documentation

## 2014-09-16 DIAGNOSIS — Z8249 Family history of ischemic heart disease and other diseases of the circulatory system: Secondary | ICD-10-CM | POA: Insufficient documentation

## 2014-09-16 DIAGNOSIS — Z79899 Other long term (current) drug therapy: Secondary | ICD-10-CM | POA: Insufficient documentation

## 2014-09-16 DIAGNOSIS — M1711 Unilateral primary osteoarthritis, right knee: Secondary | ICD-10-CM | POA: Diagnosis not present

## 2014-09-16 DIAGNOSIS — M7541 Impingement syndrome of right shoulder: Secondary | ICD-10-CM | POA: Diagnosis present

## 2014-09-16 DIAGNOSIS — Y999 Unspecified external cause status: Secondary | ICD-10-CM | POA: Insufficient documentation

## 2014-09-16 DIAGNOSIS — X58XXXA Exposure to other specified factors, initial encounter: Secondary | ICD-10-CM | POA: Insufficient documentation

## 2014-09-16 DIAGNOSIS — S43429A Sprain of unspecified rotator cuff capsule, initial encounter: Secondary | ICD-10-CM | POA: Diagnosis not present

## 2014-09-16 DIAGNOSIS — S46011A Strain of muscle(s) and tendon(s) of the rotator cuff of right shoulder, initial encounter: Secondary | ICD-10-CM | POA: Insufficient documentation

## 2014-09-16 DIAGNOSIS — Z888 Allergy status to other drugs, medicaments and biological substances status: Secondary | ICD-10-CM | POA: Diagnosis not present

## 2014-09-16 DIAGNOSIS — Y929 Unspecified place or not applicable: Secondary | ICD-10-CM | POA: Insufficient documentation

## 2014-09-16 DIAGNOSIS — M07611 Enteropathic arthropathies, right shoulder: Secondary | ICD-10-CM | POA: Diagnosis not present

## 2014-09-16 DIAGNOSIS — Y939 Activity, unspecified: Secondary | ICD-10-CM | POA: Insufficient documentation

## 2014-09-16 DIAGNOSIS — Z823 Family history of stroke: Secondary | ICD-10-CM | POA: Diagnosis not present

## 2014-09-16 DIAGNOSIS — M19011 Primary osteoarthritis, right shoulder: Secondary | ICD-10-CM | POA: Insufficient documentation

## 2014-09-16 HISTORY — DX: Primary osteoarthritis, left shoulder: M19.012

## 2014-09-16 LAB — POCT HEMOGLOBIN-HEMACUE: HEMOGLOBIN: 15.8 g/dL — AB (ref 12.0–15.0)

## 2014-09-16 SURGERY — SHOULDER ARTHROSCOPY WITH SUBACROMIAL DECOMPRESSION AND DISTAL CLAVICLE EXCISION
Anesthesia: General | Site: Shoulder | Laterality: Right

## 2014-09-16 MED ORDER — DEXAMETHASONE SODIUM PHOSPHATE 4 MG/ML IJ SOLN
INTRAMUSCULAR | Status: DC | PRN
  Administered 2014-09-16: 10 mg via INTRAVENOUS

## 2014-09-16 MED ORDER — PROMETHAZINE HCL 25 MG/ML IJ SOLN
INTRAMUSCULAR | Status: AC
  Filled 2014-09-16: qty 1

## 2014-09-16 MED ORDER — BACLOFEN 10 MG PO TABS: 10.0000 mg | ORAL_TABLET | Freq: Three times a day (TID) | ORAL | Status: DC

## 2014-09-16 MED ORDER — SUCCINYLCHOLINE CHLORIDE 20 MG/ML IJ SOLN
INTRAMUSCULAR | Status: DC | PRN
  Administered 2014-09-16: 100 mg via INTRAVENOUS

## 2014-09-16 MED ORDER — HYDROMORPHONE HCL 1 MG/ML IJ SOLN: 0.2500 mg | INTRAMUSCULAR | Status: DC | PRN

## 2014-09-16 MED ORDER — ALBUTEROL SULFATE (2.5 MG/3ML) 0.083% IN NEBU
2.5000 mg | INHALATION_SOLUTION | Freq: Once | RESPIRATORY_TRACT | Status: AC
  Administered 2014-09-16: 2.5 mg via RESPIRATORY_TRACT

## 2014-09-16 MED ORDER — CEFAZOLIN SODIUM-DEXTROSE 2-3 GM-% IV SOLR
INTRAVENOUS | Status: AC
  Filled 2014-09-16: qty 50

## 2014-09-16 MED ORDER — PROMETHAZINE HCL 25 MG/ML IJ SOLN
6.2500 mg | INTRAMUSCULAR | Status: DC | PRN
  Administered 2014-09-16: 6.25 mg via INTRAVENOUS

## 2014-09-16 MED ORDER — SENNA-DOCUSATE SODIUM 8.6-50 MG PO TABS: 2.0000 | ORAL_TABLET | Freq: Every day | ORAL | Status: DC

## 2014-09-16 MED ORDER — ONDANSETRON HCL 4 MG/2ML IJ SOLN
INTRAMUSCULAR | Status: DC | PRN
  Administered 2014-09-16: 4 mg via INTRAVENOUS

## 2014-09-16 MED ORDER — CEFAZOLIN SODIUM-DEXTROSE 2-3 GM-% IV SOLR
2.0000 g | INTRAVENOUS | Status: AC
  Administered 2014-09-16: 2 g via INTRAVENOUS

## 2014-09-16 MED ORDER — ALBUTEROL SULFATE (2.5 MG/3ML) 0.083% IN NEBU
INHALATION_SOLUTION | RESPIRATORY_TRACT | Status: AC
  Filled 2014-09-16: qty 3

## 2014-09-16 MED ORDER — MIDAZOLAM HCL 2 MG/2ML IJ SOLN
INTRAMUSCULAR | Status: AC
Start: 2014-09-16 — End: 2014-09-16
  Filled 2014-09-16: qty 2

## 2014-09-16 MED ORDER — MIDAZOLAM HCL 2 MG/2ML IJ SOLN
INTRAMUSCULAR | Status: AC
  Filled 2014-09-16: qty 2

## 2014-09-16 MED ORDER — PROPOFOL 10 MG/ML IV BOLUS
INTRAVENOUS | Status: DC | PRN
  Administered 2014-09-16: 200 mg via INTRAVENOUS

## 2014-09-16 MED ORDER — SODIUM CHLORIDE 0.9 % IR SOLN
Status: DC | PRN
  Administered 2014-09-16: 6000 mL

## 2014-09-16 MED ORDER — FENTANYL CITRATE 0.05 MG/ML IJ SOLN
INTRAMUSCULAR | Status: AC
  Filled 2014-09-16: qty 6

## 2014-09-16 MED ORDER — LACTATED RINGERS IV SOLN
INTRAVENOUS | Status: DC
  Administered 2014-09-16 (×2): via INTRAVENOUS

## 2014-09-16 MED ORDER — SODIUM CHLORIDE 0.9 % IJ SOLN
INTRAMUSCULAR | Status: AC
  Filled 2014-09-16: qty 10

## 2014-09-16 MED ORDER — BUPIVACAINE-EPINEPHRINE (PF) 0.5% -1:200000 IJ SOLN
INTRAMUSCULAR | Status: DC | PRN
  Administered 2014-09-16: 30 mL via PERINEURAL

## 2014-09-16 MED ORDER — FENTANYL CITRATE 0.05 MG/ML IJ SOLN
50.0000 ug | INTRAMUSCULAR | Status: DC | PRN
  Administered 2014-09-16: 100 ug via INTRAVENOUS

## 2014-09-16 MED ORDER — MIDAZOLAM HCL 2 MG/2ML IJ SOLN
1.0000 mg | INTRAMUSCULAR | Status: DC | PRN
  Administered 2014-09-16: 2 mg via INTRAVENOUS

## 2014-09-16 MED ORDER — FENTANYL CITRATE 0.05 MG/ML IJ SOLN
INTRAMUSCULAR | Status: AC
  Filled 2014-09-16: qty 2

## 2014-09-16 MED ORDER — OXYCODONE-ACETAMINOPHEN 10-325 MG PO TABS: 1.0000 | ORAL_TABLET | Freq: Four times a day (QID) | ORAL | Status: DC | PRN

## 2014-09-16 MED ORDER — ONDANSETRON HCL 4 MG PO TABS: 4.0000 mg | ORAL_TABLET | Freq: Three times a day (TID) | ORAL | Status: DC | PRN

## 2014-09-16 SURGICAL SUPPLY — 66 items
BLADE CUTTER GATOR 3.5 (BLADE) ×3 IMPLANT
BLADE GREAT WHITE 4.2 (BLADE) IMPLANT
BLADE GREAT WHITE 4.2MM (BLADE)
BLADE SURG 15 STRL LF DISP TIS (BLADE) IMPLANT
BLADE SURG 15 STRL SS (BLADE)
BLADE VORTEX 6.0 (BLADE) IMPLANT
BUR OVAL 4.0 (BURR) IMPLANT
BUR OVAL 6.0 (BURR) ×2 IMPLANT
CANNULA 5.75X71 LONG (CANNULA) ×2 IMPLANT
CANNULA TWIST IN 8.25X7CM (CANNULA) IMPLANT
CLOSURE STERI-STRIP 1/2X4 (GAUZE/BANDAGES/DRESSINGS) ×1
CLSR STERI-STRIP ANTIMIC 1/2X4 (GAUZE/BANDAGES/DRESSINGS) ×2 IMPLANT
DECANTER SPIKE VIAL GLASS SM (MISCELLANEOUS) IMPLANT
DRAPE INCISE IOBAN 66X45 STRL (DRAPES) ×3 IMPLANT
DRAPE SHOULDER BEACH CHAIR (DRAPES) ×3 IMPLANT
DRAPE U 20/CS (DRAPES) ×3 IMPLANT
DRAPE U-SHAPE 47X51 STRL (DRAPES) ×3 IMPLANT
DRSG PAD ABDOMINAL 8X10 ST (GAUZE/BANDAGES/DRESSINGS) ×3 IMPLANT
DURAPREP 26ML APPLICATOR (WOUND CARE) ×3 IMPLANT
ELECT REM PT RETURN 9FT ADLT (ELECTROSURGICAL) ×3
ELECTRODE REM PT RTRN 9FT ADLT (ELECTROSURGICAL) ×1 IMPLANT
FIBERSTICK 2 (SUTURE) IMPLANT
GAUZE SPONGE 4X4 12PLY STRL (GAUZE/BANDAGES/DRESSINGS) ×3 IMPLANT
GLOVE BIO SURGEON STRL SZ7 (GLOVE) ×2 IMPLANT
GLOVE BIO SURGEON STRL SZ8 (GLOVE) ×3 IMPLANT
GLOVE BIOGEL PI IND STRL 7.5 (GLOVE) IMPLANT
GLOVE BIOGEL PI IND STRL 8 (GLOVE) ×2 IMPLANT
GLOVE BIOGEL PI INDICATOR 7.5 (GLOVE) ×2
GLOVE BIOGEL PI INDICATOR 8 (GLOVE) ×4
GLOVE EXAM NITRILE MD LF STRL (GLOVE) ×2 IMPLANT
GLOVE ORTHO TXT STRL SZ7.5 (GLOVE) ×3 IMPLANT
GOWN STRL REUS W/ TWL LRG LVL3 (GOWN DISPOSABLE) ×1 IMPLANT
GOWN STRL REUS W/ TWL XL LVL3 (GOWN DISPOSABLE) ×2 IMPLANT
GOWN STRL REUS W/TWL LRG LVL3 (GOWN DISPOSABLE) ×3
GOWN STRL REUS W/TWL XL LVL3 (GOWN DISPOSABLE) ×6
IMMOBILIZER SHOULDER FOAM XLGE (SOFTGOODS) IMPLANT
KIT SHOULDER TRACTION (DRAPES) ×3 IMPLANT
MANIFOLD NEPTUNE II (INSTRUMENTS) ×3 IMPLANT
NDL SCORPION MULTI FIRE (NEEDLE) ×1 IMPLANT
NEEDLE SCORPION MULTI FIRE (NEEDLE) IMPLANT
PACK ARTHROSCOPY DSU (CUSTOM PROCEDURE TRAY) ×3 IMPLANT
PACK BASIN DAY SURGERY FS (CUSTOM PROCEDURE TRAY) ×3 IMPLANT
SET ARTHROSCOPY TUBING (MISCELLANEOUS) ×3
SET ARTHROSCOPY TUBING LN (MISCELLANEOUS) ×1 IMPLANT
SHEET MEDIUM DRAPE 40X70 STRL (DRAPES) ×3 IMPLANT
SLEEVE SCD COMPRESS KNEE MED (MISCELLANEOUS) ×3 IMPLANT
SLING ARM IMMOBILIZER LRG (SOFTGOODS) IMPLANT
SLING ARM IMMOBILIZER MED (SOFTGOODS) IMPLANT
SLING ARM LRG ADULT FOAM STRAP (SOFTGOODS) IMPLANT
SLING ARM MED ADULT FOAM STRAP (SOFTGOODS) ×2 IMPLANT
SLING ARM XL FOAM STRAP (SOFTGOODS) IMPLANT
SUT FIBERWIRE #2 38 T-5 BLUE (SUTURE)
SUT MNCRL AB 4-0 PS2 18 (SUTURE) ×2 IMPLANT
SUT PDS AB 1 CT  36 (SUTURE) ×2
SUT PDS AB 1 CT 36 (SUTURE) IMPLANT
SUT TIGER TAPE 7 IN WHITE (SUTURE) IMPLANT
SUT VIC AB 3-0 SH 27 (SUTURE)
SUT VIC AB 3-0 SH 27X BRD (SUTURE) IMPLANT
SUTURE FIBERWR #2 38 T-5 BLUE (SUTURE) IMPLANT
TAPE FIBER 2MM 7IN #2 BLUE (SUTURE) IMPLANT
TOWEL OR 17X24 6PK STRL BLUE (TOWEL DISPOSABLE) ×3 IMPLANT
TOWEL OR NON WOVEN STRL DISP B (DISPOSABLE) ×3 IMPLANT
TUBE CONNECTING 20'X1/4 (TUBING) ×1
TUBE CONNECTING 20X1/4 (TUBING) ×2 IMPLANT
WAND STAR VAC 90 (SURGICAL WAND) ×3 IMPLANT
WATER STERILE IRR 1000ML POUR (IV SOLUTION) ×3 IMPLANT

## 2014-09-16 NOTE — Transfer of Care (Signed)
Immediate Anesthesia Transfer of Care Note  Patient: Jessica [name]  Procedure(s) Performed: Procedure(s): RIGHT SHOULDER ARTHROSCOPY WITH DEBRIDEMENT/DISTAL CLAVICLE EXCISION AND ACROMIOPLASTY (Right)  Patient Location: PACU  Anesthesia Type:GA combined with regional for post-op pain  Level of Consciousness: awake, oriented and patient cooperative  Airway & Oxygen Therapy: Patient Spontanous Breathing and Patient connected to face mask oxygen  Post-op Assessment: Report given to PACU RN and Post -op Vital signs reviewed and stable  Post vital signs: Reviewed and stable  Complications: No apparent anesthesia complications

## 2014-09-16 NOTE — Anesthesia Postprocedure Evaluation (Signed)
Anesthesia Post Note  Patient: Jessica [name]  Procedure(s) Performed: Procedure(s) (LRB): RIGHT SHOULDER ARTHROSCOPY WITH DEBRIDEMENT/DISTAL CLAVICLE EXCISION AND ACROMIOPLASTY (Right)  Anesthesia type: general  Patient location: PACU  Post pain: Pain level controlled  Post assessment: Patient's Cardiovascular Status Stable  Last Vitals:  Filed Vitals:   09/16/14 1515  BP: 124/58  Pulse: 59  Temp:   Resp: 18    Post vital signs: Reviewed and stable  Level of consciousness: sedated  Complications: No apparent anesthesia complications

## 2014-09-16 NOTE — Discharge Instructions (Signed)
Diet: As you were doing prior to hospitalization   Shower:  May shower but keep the wounds dry, use an occlusive plastic wrap, NO SOAKING IN TUB.  If the bandage gets wet, change with a clean dry gauze.  Dressing:  You may change your dressing 3-5 days after surgery.  Then change the dressing daily with sterile gauze dressing.    There are sticky tapes (steri-strips) on your wounds and all the stitches are absorbable.  Leave the steri-strips in place when changing your dressings, they will peel off with time, usually 2-3 weeks.  Activity:  Increase activity slowly as tolerated, but follow the weight bearing instructions below.  No lifting or driving for 6 weeks.  Weight Bearing:   Ok to remove sling and use arm as tolerated..    To prevent constipation: you may use a stool softener such as -  Colace (over the counter) 100 mg by mouth twice a day  Drink plenty of fluids (prune juice may be helpful) and high fiber foods Miralax (over the counter) for constipation as needed.    Itching:  If you experience itching with your medications, try taking only a single pain pill, or even half a pain pill at a time.  You may take up to 10 pain pills per day, and you can also use benadryl over the counter for itching or also to help with sleep.   Precautions:  If you experience chest pain or shortness of breath - call 911 immediately for transfer to the hospital emergency department!!  If you develop a fever greater that 101 F, purulent drainage from wound, increased redness or drainage from wound, or calf pain -- Call the office at [phone]                                                Follow- Up Appointment:  Please call for an appointment to be seen in 2 weeks [place] - [phone]      Post Anesthesia Home Care Instructions  Activity: Get plenty of rest for the remainder of the day. A responsible adult should stay with you for 24 hours following the procedure.  For the next 24 hours,  DO NOT: -Drive a car -Paediatric nurse -Drink alcoholic beverages -Take any medication unless instructed by your physician -Make any legal decisions or sign important papers.  Meals: Start with liquid foods such as gelatin or soup. Progress to regular foods as tolerated. Avoid greasy, spicy, heavy foods. If nausea and/or vomiting occur, drink only clear liquids until the nausea and/or vomiting subsides. Call your physician if vomiting continues.  Special Instructions/Symptoms: Your throat may feel dry or sore from the anesthesia or the breathing tube placed in your throat during surgery. If this causes discomfort, gargle with warm salt water. The discomfort should disappear within 24 hours.   Regional Anesthesia Blocks  1. Numbness or the inability to move the "blocked" extremity may last from 3-48 hours after placement. The length of time depends on the medication injected and your individual response to the medication. If the numbness is not going away after 48 hours, call your surgeon.  2. The extremity that is blocked will need to be protected until the numbness is gone and the  Strength has returned. Because you cannot feel it, you will need to take extra care to avoid injury. Because it  may be weak, you may have difficulty moving it or using it. You may not know what position it is in without looking at it while the block is in effect.  3. For blocks in the legs and feet, returning to weight bearing and walking needs to be done carefully. You will need to wait until the numbness is entirely gone and the strength has returned. You should be able to move your leg and foot normally before you try and bear weight or walk. You will need someone to be with you when you first try to ensure you do not fall and possibly risk injury.  4. Bruising and tenderness at the needle site are common side effects and will resolve in a few days.  5. Persistent numbness or new problems with movement should  be communicated to the surgeon or the [facility] [phone] [place] [phone]).

## 2014-09-16 NOTE — Anesthesia Preprocedure Evaluation (Signed)
Anesthesia Evaluation  Patient identified by MRN, date of birth, ID band Patient awake    Reviewed: Allergy & Precautions, NPO status , Patient's Chart, lab work & pertinent test results  Airway Mallampati: II  TM Distance: >3 FB Neck ROM: Full    Dental  (+) Teeth Intact, Caps, Dental Advisory Given   Pulmonary COPD COPD inhaler, Current Smoker,    Pulmonary exam normal       Cardiovascular negative cardio ROS      Neuro/Psych negative neurological ROS  negative psych ROS   GI/Hepatic Neg liver ROS, GERD-  Controlled,  Endo/Other  negative endocrine ROS  Renal/GU negative Renal ROS     Musculoskeletal   Abdominal   Peds  Hematology   Anesthesia Other Findings   Reproductive/Obstetrics                             Anesthesia Physical Anesthesia Plan  ASA: III  Anesthesia Plan: General   Post-op Pain Management:    Induction: Intravenous  Airway Management Planned: Oral ETT  Additional Equipment:   Intra-op Plan:   Post-operative Plan: Extubation in OR  Informed Consent: I have reviewed the patients History and Physical, chart, labs and discussed the procedure including the risks, benefits and alternatives for the proposed anesthesia with the patient or authorized representative who has indicated his/her understanding and acceptance.   Dental advisory given  Plan Discussed with: CRNA, Anesthesiologist and Surgeon  Anesthesia Plan Comments:         Anesthesia Quick Evaluation

## 2014-09-16 NOTE — H&P (Signed)
PREOPERATIVE H&P  Chief Complaint: right shoulder impingement and articular cartilage disorder  HPI: Jessica [name] is a 63 y.o. female who presents for preoperative history and physical with a diagnosis of right shoulder impingement and articular cartilage disorder. Symptoms are rated as moderate to severe, and have been worsening.  This is significantly impairing activities of daily living.  She has elected for surgical management. Pain is rated 7/10, activity makes it worse, as a dull aching pain going down into the biceps. She has had injections and physical therapy and anti-inflammatories with no improvement.  Past Medical History  Diagnosis Date  . GERD (gastroesophageal reflux disease)   . COPD (chronic obstructive pulmonary disease)     infreq exac, continued tobacco abuse  . Osteoarthritis of knee     R knee  . Dyslipidemia    Past Surgical History  Procedure Laterality Date  . Appendectomy  1995  . Abdominal hysterectomy  1992  . Right knee surgery  88 & 02    "torn cartledge"  . Tubal ligation    . Mouth surgery      implants   History   Social History  . Marital Status: Married    Spouse Name: N/A    Number of Children: N/A  . Years of Education: N/A   Social History Main Topics  . Smoking status: Current Every Day Smoker -- 1.00 packs/day  . Smokeless tobacco: None  . Alcohol Use: Yes     Comment: Infrequently   . Drug Use: No  . Sexual Activity: None   Other Topics Concern  . None   Social History Narrative   Family History  Problem Relation Age of Onset  . Breast cancer Maternal Aunt   . Hypertension Mother   . Stroke Other   . Heart disease Mother   . Hypertension Brother   . Multiple sclerosis Father    Allergies  Allergen Reactions  . Chantix [Varenicline] Nausea And Vomiting    dizzy   Prior to Admission medications   Medication Sig Start Date End Date Taking? Authorizing Provider  albuterol (PROVENTIL HFA;VENTOLIN HFA) 108 (90  BASE) MCG/ACT inhaler Inhale 2 puffs into the lungs every 6 (six) hours as needed for wheezing. 02/09/13 02/09/14  Rowe Clack, MD  atorvastatin (LIPITOR) 20 MG tablet Take 1 tablet (20 mg total) by mouth daily. 04/16/13   Rowe Clack, MD  DIGESTIVE ENZYMES PO Take by mouth daily.    Historical Provider, MD  fluticasone (FLONASE) 50 MCG/ACT nasal spray Place 2 sprays into both nostrils daily. 08/05/14   Olga Millers, MD     Positive ROS: All other systems have been reviewed and were otherwise negative with the exception of those mentioned in the HPI and as above.  Physical Exam: General: Alert, no acute distress Cardiovascular: No pedal edema Respiratory: No cyanosis, no use of accessory musculature GI: No organomegaly, abdomen is soft and non-tender Skin: No lesions in the area of chief complaint Neurologic: Sensation intact distally Psychiatric: Patient is competent for consent with normal mood and affect Lymphatic: No axillary or cervical lymphadenopathy  MUSCULOSKELETAL: Right shoulder active motion is 0-165, external rotation to 75, cuff strength is fair, positive pain to palpation over the before meals joint.  MRI demonstrates intact rotator cuff musculature, although some tendinopathy, no full-thickness tear.  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 We also discussed the risks for incomplete relief of symptoms, stiffness, among  others.  Johnny Bridge, MD [facility] [phone]   09/16/2014 10:23 AM

## 2014-09-16 NOTE — Anesthesia Procedure Notes (Addendum)
Anesthesia Regional Block:  Interscalene brachial plexus block  Pre-Anesthetic Checklist: ,, timeout performed, Correct Patient, Correct Site, Correct Laterality, Correct Procedure, Correct Position, site marked, Risks and benefits discussed,  Surgical consent,  Pre-op evaluation,  At surgeon's request and post-op pain management  Laterality: Right  Prep: chloraprep       Needles:  Injection technique: Single-shot  Needle Type: Echogenic Stimulator Needle     Needle Length: 5cm 5 cm Needle Gauge: 22 and 22 G    Additional Needles:  Procedures: ultrasound guided (picture in chart) and nerve stimulator Interscalene brachial plexus block  Nerve Stimulator or Paresthesia:  Response: bicep contraction, 0.45 mA,   Additional Responses:   Narrative:  Start time: 09/16/2014 12:01 PM End time: 09/16/2014 12:09 PM Injection made incrementally with aspirations every 5 mL.  Performed by: Personally  Anesthesiologist: Duane Boston  Additional Notes: Functioning IV was confirmed and monitors applied.  A 57mm 22ga echogenic arrow stimulator was used. Sterile prep and drape,hand hygiene and sterile gloves were used.Ultrasound guidance: relevant anatomy identified, needle position confirmed, local anesthetic spread visualized around nerve(s)., vascular puncture avoided.  Image printed for medical record.  Negative aspiration and negative test dose prior to incremental administration of local anesthetic. The patient tolerated the procedure well.   Procedure Name: Intubation Date/Time: 09/16/2014 12:32 PM Performed by: Lyndee Leo Pre-anesthesia Checklist: Patient identified, Emergency Drugs available, Suction available and Patient being monitored Patient Re-evaluated:Patient Re-evaluated prior to inductionOxygen Delivery Method: Circle System Utilized Preoxygenation: Pre-oxygenation with 100% oxygen Intubation Type: IV induction Ventilation: Mask ventilation without  difficulty Laryngoscope Size: Mac and 3 Grade View: Grade II Tube type: Oral Tube size: 7.0 mm Number of attempts: 1 Airway Equipment and Method: stylet and oral airway Placement Confirmation: ETT inserted through vocal cords under direct vision,  positive ETCO2 and breath sounds checked- equal and bilateral Secured at: 20 cm Tube secured with: Tape Dental Injury: Teeth and Oropharynx as per pre-operative assessment

## 2014-09-16 NOTE — Op Note (Signed)
09/16/2014  1:48 PM  PATIENT:  Jessica [name]    PRE-OPERATIVE DIAGNOSIS:  Right shoulder impingement syndrome with before meals joint arthrosis and partial thickness rotator cuff tear  POST-OPERATIVE DIAGNOSIS:  Same  PROCEDURE:  Right shoulder arthroscopy, extensive debridement of labrum and undersurface rotator cuff tear with bursectomy, acromioplasty, distal clavicle resection  SURGEON:  Johnny Bridge, MD  PHYSICIAN ASSISTANT: Joya Gaskins, OPA-C, present and scrubbed throughout the case, critical for completion in a timely fashion, and for retraction, instrumentation, and closure.  ANESTHESIA:   General  PREOPERATIVE INDICATIONS:  [name] is a  63 y.o. female who complained of persistent left shoulder pain, difficulty with lifting her arm, failed conservative measures and elected for surgical management.  The risks benefits and alternatives were discussed with the patient preoperatively including but not limited to the risks of infection, bleeding, nerve injury, cardiopulmonary complications, the need for revision surgery, among others, and the patient was willing to proceed. We also discussed the risks for incomplete relief of symptoms, the potential need for revision surgery, cardiopulmonary complications, among others.  OPERATIVE IMPLANTS: None  OPERATIVE FINDINGS: The shoulder had full motion during examination under anesthesia with no evidence for his capsulitis. The biceps tendon was intact, and the biceps pulley was intact and the subscapularis had some fraying of the upper border with some splitting of the tendon, but no detachment from the humeral head. The superior labrum was torn, with extensive anterior and posterior labral fraying as well. The undersurface of the supraspinatus of the capsular insertion had a moderate amount of tendinopathy, maybe about 30% full-thickness, and the fibers of the tendon were intact beyond that. We were still well adherent to the  bone. The cable was still present, although the capsular tissue was frayed. From the bursal side of the rotator cuff was completely intact, and the CA ligament did have some fraying although not severe, and there was moderate subacromial spurring and advanced degenerative changes in the before meals joint.  OPERATIVE PROCEDURE: The patient was brought to the operating room and placed in the supine position. Gen. anesthesia was administered. IV antibiotics were given. Examination of the left right upper extremity was carried out. The above-named findings noted. She was then placed in the semilateral decubitus position with all bony prominences padded. The right upper extremity was prepped and draped in usual sterile fashion with about 10 pounds of suspension traction. Time out performed. Diagnostic arthroscopy was carried out the above-named findings. The arthroscopic shaver was used to debride the labrum and the frayed undersurface supraspinatus tissue back to a stable configuration. There was about one third of the footprint exposed after debridement of the capsule. I used a 0-PDS stitch to tack the weakest portion of the supraspinatus.  At then went to the subacromial space, performed a complete bursectomy, and closely evaluated the rotator cuff. I probed the cuff aggressively, and it did not communicate to a full-thickness tear and the tissue was still well connected to the humeral head. For this reason I did not take down the supraspinatus. The cuff continued to move as a single unit attached with the head.  Therefore I released the CA ligament, performed a moderate acromioplasty, and also resected about 10 mm of distal clavicle using a 6-0 bur. Confirmation of satisfactory resection was made from both lateral and anterior portals as well as the posterior portal.  The instruments were removed, portals closed with Monocryl followed by Steri-Strips and sterile gauze, and the patient was awakened  and  returned to the PACU in stable and satisfactory condition. There were no complications and she tolerated the procedure well.

## 2014-09-16 NOTE — Progress Notes (Signed)
Assisted Dr. Tobias Alexander with right, ultrasound guided, interscalene  block. Side rails up, monitors on throughout procedure. See vital signs in flow sheet. Tolerated Procedure well.

## 2015-03-01 ENCOUNTER — Telehealth: Payer: Self-pay

## 2015-03-01 NOTE — Telephone Encounter (Signed)
Left message advising patient last mammogram was 02/2012 and time for mammogram is now, call office back to update info if she has already had one or she can call to schedule one

## 2015-03-27 ENCOUNTER — Other Ambulatory Visit: Payer: Self-pay

## 2015-09-21 ENCOUNTER — Ambulatory Visit (INDEPENDENT_AMBULATORY_CARE_PROVIDER_SITE_OTHER): Payer: BC Managed Care – PPO | Admitting: Family

## 2015-09-21 ENCOUNTER — Encounter: Payer: Self-pay | Admitting: Family

## 2015-09-21 VITALS — BP 138/72 | HR 64 | Temp 98.3°F | Resp 18 | Ht 62.0 in | Wt 150.4 lb

## 2015-09-21 DIAGNOSIS — J209 Acute bronchitis, unspecified: Secondary | ICD-10-CM | POA: Insufficient documentation

## 2015-09-21 MED ORDER — HYDROCODONE-HOMATROPINE 5-1.5 MG/5ML PO SYRP: 5.0000 mL | ORAL_SOLUTION | Freq: Three times a day (TID) | ORAL | Status: DC | PRN

## 2015-09-21 MED ORDER — PREDNISONE 20 MG PO TABS: 20.0000 mg | ORAL_TABLET | Freq: Two times a day (BID) | ORAL | Status: DC

## 2015-09-21 MED ORDER — AZITHROMYCIN 250 MG PO TABS: ORAL_TABLET | ORAL | Status: DC

## 2015-09-21 NOTE — Assessment & Plan Note (Signed)
Symptoms and exam consistent with acute bronchitis. Start azithromycin. Start hycodan as needed for cough and sleep. Start prednisone for inflammation and cough. Continue over the counter medications as needed for symptom relief and supportive care. Follow up if symptoms worsen or fail to improve.

## 2015-09-21 NOTE — Patient Instructions (Signed)
Thank you for choosing [facility].  Summary/Instructions:  Your prescription(s) have been submitted to your pharmacy or been printed and provided for you. Please take as directed and contact our office if you believe you are having problem(s) with the medication(s) or have any questions.  If your symptoms worsen or fail to improve, please contact our office for further instruction, or in case of emergency go directly to the emergency room at the closest medical facility.   Acute Bronchitis Bronchitis is inflammation of the airways that extend from the windpipe into the lungs (bronchi). The inflammation often causes mucus to develop. This leads to a cough, which is the most common symptom of bronchitis.  In acute bronchitis, the condition usually develops suddenly and goes away over time, usually in a couple weeks. Smoking, allergies, and asthma can make bronchitis worse. Repeated episodes of bronchitis may cause further lung problems.  CAUSES Acute bronchitis is most often caused by the same virus that causes a cold. The virus can spread from person to person (contagious) through coughing, sneezing, and touching contaminated objects. SIGNS AND SYMPTOMS  1. Cough.  2. Fever.  3. Coughing up mucus.  4. Body aches.  5. Chest congestion.  6. Chills.  7. Shortness of breath.  8. Sore throat.  DIAGNOSIS  Acute bronchitis is usually diagnosed through a physical exam. Your health care provider will also ask you questions about your medical history. Tests, such as chest X-rays, are sometimes done to rule out other conditions.  TREATMENT  Acute bronchitis usually goes away in a couple weeks. Oftentimes, no medical treatment is necessary. Medicines are sometimes given for relief of fever or cough. Antibiotic medicines are usually not needed but may be prescribed in certain situations. In some cases, an inhaler may be recommended to help reduce shortness of breath and control the cough.  A cool mist vaporizer may also be used to help thin bronchial secretions and make it easier to clear the chest.  HOME CARE INSTRUCTIONS 1. Get plenty of rest.  2. Drink enough fluids to keep your urine clear or pale yellow (unless you have a medical condition that requires fluid restriction). Increasing fluids may help thin your respiratory secretions (sputum) and reduce chest congestion, and it will prevent dehydration.  3. Take medicines only as directed by your health care provider. 4. If you were prescribed an antibiotic medicine, finish it all even if you start to feel better. 5. Avoid smoking and secondhand smoke. Exposure to cigarette smoke or irritating chemicals will make bronchitis worse. If you are a smoker, consider using nicotine gum or skin patches to help control withdrawal symptoms. Quitting smoking will help your lungs heal faster.  6. Reduce the chances of another bout of acute bronchitis by washing your hands frequently, avoiding people with cold symptoms, and trying not to touch your hands to your mouth, nose, or eyes.  7. Keep all follow-up visits as directed by your health care provider.  SEEK MEDICAL CARE IF: Your symptoms do not improve after 1 week of treatment.  SEEK IMMEDIATE MEDICAL CARE IF: 1. You develop an increased fever or chills.  2. You have chest pain.  3. You have severe shortness of breath. 4. You have bloody sputum.  5. You develop dehydration. 6. You faint or repeatedly feel like you are going to pass out. 7. You develop repeated vomiting. 8. You develop a severe headache. MAKE SURE YOU:  1. Understand these instructions. 2. Will watch your condition. 3. Will get  help right away if you are not doing well or get worse.   This information is not intended to replace advice given to you by your health care provider. Make sure you discuss any questions you have with your health care provider.   Document Released: 10/24/2004 Document Revised:  10/07/2014 Document Reviewed: 03/09/2013 Elsevier Interactive Patient Education 2016 [employer]  General Recommendations:    Please drink plenty of fluids.  Get plenty of rest   Sleep in humidified air  Use saline nasal sprays  Netti pot   OTC Medications:  Decongestants - helps relieve congestion   Flonase (generic fluticasone) or Nasacort (generic triamcinolone) - please make sure to use the "cross-over" technique at a 45 degree angle towards the opposite eye as opposed to straight up the nasal passageway.   Sudafed (generic pseudoephedrine - Note this is the one that is available behind the pharmacy counter); Products with phenylephrine (-PE) may also be used but is often not as effective as pseudoephedrine.   If you have HIGH BLOOD PRESSURE - Coricidin HBP; AVOID any product that is -D as this contains pseudoephedrine which may increase your blood pressure.  Afrin (oxymetazoline) every 6-8 hours for up to 3 days.   Allergies - helps relieve runny nose, itchy eyes and sneezing   Claritin (generic loratidine), Allegra (fexofenidine), or Zyrtec (generic cyrterizine) for runny nose. These medications should not cause drowsiness.  Note - Benadryl (generic diphenhydramine) may be used however may cause drowsiness  Cough -   Delsym or Robitussin (generic dextromethorphan)  Expectorants - helps loosen mucus to ease removal   Mucinex (generic guaifenesin) as directed on the package.  Headaches / General Aches   Tylenol (generic acetaminophen) - DO NOT EXCEED 3 grams (3,000 mg) in a 24 hour time period  Advil/Motrin (generic ibuprofen)   Sore Throat -   Salt water gargle   Chloraseptic (generic benzocaine) spray or lozenges / Sucrets (generic dyclonine)

## 2015-09-21 NOTE — Progress Notes (Signed)
Pre visit review using our clinic review tool, if applicable. No additional management support is needed unless otherwise documented below in the visit note. 

## 2015-09-21 NOTE — Progress Notes (Signed)
Subjective:    Patient ID: Jessica [name], female    DOB: 1950/10/24, 64 y.o.   MRN: [id number]  Chief Complaint  Patient presents with  . Cough    x2 weeks sinus headache, congestion, cough, throat burns and chest burns, coughs so bad it makes her gag    HPI:  Jessica [name] is a 64 y.o. female who  has a past medical history of GERD (gastroesophageal reflux disease); COPD (chronic obstructive pulmonary disease) ([place]); Osteoarthritis of knee; Dyslipidemia; and Arthrosis of left acromioclavicular joint (09/16/2014). and presents today for an acute office visit.   This is a new problem. Associated symptom of sinus headache, congestion, cough, throat and chest burn have been going on for about 2 weeks. Modifying factors include OTC medications which did initially help with her symptoms but not effect the course which has worsened since onset. Denies fevers. Cough is severe enough to keep her up at night. Denies recent antibiotic usage.   Allergies  Allergen Reactions  . Chantix [Varenicline] Nausea And Vomiting    dizzy     Current Outpatient Prescriptions on File Prior to Visit  Medication Sig Dispense Refill  . atorvastatin (LIPITOR) 20 MG tablet Take 1 tablet (20 mg total) by mouth daily. 90 tablet 3  . baclofen (LIORESAL) 10 MG tablet Take 1 tablet (10 mg total) by mouth 3 (three) times daily. As needed for muscle spasm 50 tablet 0  . DIGESTIVE ENZYMES PO Take by mouth daily.    . fluticasone (FLONASE) 50 MCG/ACT nasal spray Place 2 sprays into both nostrils daily. 16 g 6  . ondansetron (ZOFRAN) 4 MG tablet Take 1 tablet (4 mg total) by mouth every 8 (eight) hours as needed for nausea or vomiting. 30 tablet 0  . oxyCODONE-acetaminophen (PERCOCET) 10-325 MG per tablet Take 1-2 tablets by mouth every 6 (six) hours as needed for pain. MAXIMUM TOTAL ACETAMINOPHEN DOSE IS 4000 MG PER DAY 75 tablet 0  . sennosides-docusate sodium (SENOKOT-S) 8.6-50 MG tablet Take 2 tablets by  mouth daily. 30 tablet 1  . albuterol (PROVENTIL HFA;VENTOLIN HFA) 108 (90 BASE) MCG/ACT inhaler Inhale 2 puffs into the lungs every 6 (six) hours as needed for wheezing. 1 Inhaler 0   No current facility-administered medications on file prior to visit.       Review of Systems  Constitutional: Negative for fever and chills.  HENT: Positive for congestion and sinus pressure.   Respiratory: Positive for cough and chest tightness. Negative for shortness of breath.   Neurological: Positive for headaches.      Objective:    BP 138/72 mmHg  Pulse 64  Temp(Src) 98.3 F (36.8 C) (Oral)  Resp 18  Ht '5\' 2"'$  (1.575 m)  Wt 150 lb 6.4 oz (68.221 kg)  BMI 27.50 kg/m2  SpO2 93% Nursing note and vital signs reviewed.  Physical Exam  Constitutional: She is oriented to person, place, and time. She appears well-developed and well-nourished. No distress.  HENT:  Right Ear: Hearing, tympanic membrane, external ear and ear canal normal.  Left Ear: Hearing, tympanic membrane, external ear and ear canal normal.  Nose: Nose normal. Right sinus exhibits no maxillary sinus tenderness and no frontal sinus tenderness. Left sinus exhibits no maxillary sinus tenderness and no frontal sinus tenderness.  Mouth/Throat: Uvula is midline, oropharynx is clear and moist and mucous membranes are normal.  Cardiovascular: Normal rate, regular rhythm, normal heart sounds and intact distal pulses.   Pulmonary/Chest: Effort normal. She has wheezes.  Neurological: She is alert and oriented to person, place, and time.  Skin: Skin is warm and dry.  Psychiatric: She has a normal mood and affect. Her behavior is normal. Judgment and thought content normal.       Assessment & Plan:   Problem List Items Addressed This Visit      Respiratory   Acute bronchitis - Primary    Symptoms and exam consistent with acute bronchitis. Start azithromycin. Start hycodan as needed for cough and sleep. Start prednisone for  inflammation and cough. Continue over the counter medications as needed for symptom relief and supportive care. Follow up if symptoms worsen or fail to improve.       Relevant Medications   azithromycin (ZITHROMAX) 250 MG tablet   HYDROcodone-homatropine (HYCODAN) 5-1.5 MG/5ML syrup   predniSONE (DELTASONE) 20 MG tablet

## 2015-09-29 ENCOUNTER — Encounter: Payer: Self-pay | Admitting: Family

## 2015-09-29 ENCOUNTER — Ambulatory Visit (INDEPENDENT_AMBULATORY_CARE_PROVIDER_SITE_OTHER): Payer: BC Managed Care – PPO | Admitting: Family

## 2015-09-29 VITALS — BP 122/70 | HR 75 | Temp 98.2°F | Resp 18 | Ht 62.0 in | Wt 154.0 lb

## 2015-09-29 DIAGNOSIS — Z1231 Encounter for screening mammogram for malignant neoplasm of breast: Secondary | ICD-10-CM | POA: Diagnosis not present

## 2015-09-29 DIAGNOSIS — Z0000 Encounter for general adult medical examination without abnormal findings: Secondary | ICD-10-CM | POA: Diagnosis not present

## 2015-09-29 DIAGNOSIS — J449 Chronic obstructive pulmonary disease, unspecified: Secondary | ICD-10-CM

## 2015-09-29 DIAGNOSIS — Z720 Tobacco use: Secondary | ICD-10-CM

## 2015-09-29 DIAGNOSIS — Z23 Encounter for immunization: Secondary | ICD-10-CM

## 2015-09-29 MED ORDER — LEVOFLOXACIN 500 MG PO TABS: 500.0000 mg | ORAL_TABLET | Freq: Every day | ORAL | Status: DC

## 2015-09-29 MED ORDER — ALBUTEROL SULFATE HFA 108 (90 BASE) MCG/ACT IN AERS: 2.0000 | INHALATION_SPRAY | Freq: Four times a day (QID) | RESPIRATORY_TRACT | Status: DC | PRN

## 2015-09-29 NOTE — Progress Notes (Signed)
Pre visit review using our clinic review tool, if applicable. No additional management support is needed unless otherwise documented below in the visit note. 

## 2015-09-29 NOTE — Assessment & Plan Note (Signed)
1) Anticipatory Guidance: Discussed importance of wearing a seatbelt while driving and not texting while driving; changing batteries in smoke detector at least once annually; wearing suntan lotion when outside; eating a balanced and moderate diet; getting physical activity at least 30 minutes per day.  2) Immunizations / Screenings / Labs:  Prevnar updated today. All other immunizations are up-to-date per recommendations. Due for a dental and vision screen which will be scheduled independently. Obtain hepatitis C antibody for hepatitis C screen. Patient requests Cologuard for colon cancer screening. Due for mammogram with order placed. Due for DEXA with order placed. All other screenings are up-to-date per recommendations. Obtain CBC, CMET, Lipid profile and TSH.   Overall adequate exam with risk factors for cardiovascular disease including hyperlipidemia, sedentary lifestyle, and tobacco use. Continue current dosage of atorvastatin for cholesterol management and follow-up pending lipid profile. Discussed importance of tobacco cessation. Patient indicates she is working with her insurance company to find ways to quit smoking. Did have adverse reactions to Chantix. Additional smoking cessation tips provided. Discussed importance of eating in nutrient dense diet that is moderate and varied and low in saturated/transfer fats. Recommend decreasing intake of sugary beverages. Continue other healthy lifestyle behaviors and choices. Follow-up prevention exam in 1 year. Follow-up office visit pending blood work if necessary.

## 2015-09-29 NOTE — Patient Instructions (Signed)
Thank you for choosing [facility].  Summary/Instructions:  Your prescription(s) have been submitted to your pharmacy or been printed and provided for you. Please take as directed and contact our office if you believe you are having problem(s) with the medication(s) or have any questions.  Please stop by the lab on the basement level of the building for your blood work. Your results will be released to [place] (or called to you) after review, usually within 72 hours after test completion. If any changes need to be made, you will be notified at that same time.  Referrals have been made during this visit. You should expect to hear back from our schedulers in about 7-10 days in regards to establishing an appointment with the specialists we discussed.   If your symptoms worsen or fail to improve, please contact our office for further instruction, or in case of emergency go directly to the emergency room at the closest medical facility.   Health Maintenance, Female Adopting a healthy lifestyle and getting preventive care can go a long way to promote health and wellness. Talk with your health care provider about what schedule of regular examinations is right for you. This is a good chance for you to check in with your provider about disease prevention and staying healthy. In between checkups, there are plenty of things you can do on your own. Experts have done a lot of research about which lifestyle changes and preventive measures are most likely to keep you healthy. Ask your health care provider for more information. WEIGHT AND DIET  Eat a healthy diet  Be sure to include plenty of vegetables, fruits, low-fat dairy products, and lean protein.  Do not eat a lot of foods high in solid fats, added sugars, or salt.  Get regular exercise. This is one of the most important things you can do for your health.  Most adults should exercise for at least 150 minutes each week. The exercise should  increase your heart rate and make you sweat (moderate-intensity exercise).  Most adults should also do strengthening exercises at least twice a week. This is in addition to the moderate-intensity exercise.  Maintain a healthy weight  Body mass index (BMI) is a measurement that can be used to identify possible weight problems. It estimates body fat based on height and weight. Your health care provider can help determine your BMI and help you achieve or maintain a healthy weight.  For females 33 years of age and older:   A BMI below 18.5 is considered underweight.  A BMI of 18.5 to 24.9 is normal.  A BMI of 25 to 29.9 is considered overweight.  A BMI of 30 and above is considered obese.  Watch levels of cholesterol and blood lipids  You should start having your blood tested for lipids and cholesterol at 66 years of age, then have this test every 5 years.  You may need to have your cholesterol levels checked more often if:  Your lipid or cholesterol levels are high.  You are older than 66 years of age.  You are at high risk for heart disease.  CANCER SCREENING   Lung Cancer  Lung cancer screening is recommended for adults 38-68 years old who are at high risk for lung cancer because of a history of smoking.  A yearly low-dose CT scan of the lungs is recommended for people who:  Currently smoke.  Have quit within the past 15 years.  Have at least a 30-pack-year history of  smoking. A pack year is smoking an average of one pack of cigarettes a day for 1 year.  Yearly screening should continue until it has been 15 years since you quit.  Yearly screening should stop if you develop a health problem that would prevent you from having lung cancer treatment.  Breast Cancer  Practice breast self-awareness. This means understanding how your breasts normally appear and feel.  It also means doing regular breast self-exams. Let your health care provider know about any changes, no  matter how small.  If you are in your 20s or 30s, you should have a clinical breast exam (CBE) by a health care provider every 1-3 years as part of a regular health exam.  If you are 40 or older, have a CBE every year. Also consider having a breast X-ray (mammogram) every year.  If you have a family history of breast cancer, talk to your health care provider about genetic screening.  If you are at high risk for breast cancer, talk to your health care provider about having an MRI and a mammogram every year.  Breast cancer gene (BRCA) assessment is recommended for women who have family members with BRCA-related cancers. BRCA-related cancers include:  Breast.  Ovarian.  Tubal.  Peritoneal cancers.  Results of the assessment will determine the need for genetic counseling and BRCA1 and BRCA2 testing. Cervical Cancer Your health care provider may recommend that you be screened regularly for cancer of the pelvic organs (ovaries, uterus, and vagina). This screening involves a pelvic examination, including checking for microscopic changes to the surface of your cervix (Pap test). You may be encouraged to have this screening done every 3 years, beginning at age 91.  For women ages 49-65, health care providers may recommend pelvic exams and Pap testing every 3 years, or they may recommend the Pap and pelvic exam, combined with testing for human papilloma virus (HPV), every 5 years. Some types of HPV increase your risk of cervical cancer. Testing for HPV may also be done on women of any age with unclear Pap test results.  Other health care providers may not recommend any screening for nonpregnant women who are considered low risk for pelvic cancer and who do not have symptoms. Ask your health care provider if a screening pelvic exam is right for you.  If you have had past treatment for cervical cancer or a condition that could lead to cancer, you need Pap tests and screening for cancer for at least  20 years after your treatment. If Pap tests have been discontinued, your risk factors (such as having a new sexual partner) need to be reassessed to determine if screening should resume. Some women have medical problems that increase the chance of getting cervical cancer. In these cases, your health care provider may recommend more frequent screening and Pap tests. Colorectal Cancer  This type of cancer can be detected and often prevented.  Routine colorectal cancer screening usually begins at 66 years of age and continues through 66 years of age.  Your health care provider may recommend screening at an earlier age if you have risk factors for colon cancer.  Your health care provider may also recommend using home test kits to check for hidden blood in the stool.  A small camera at the end of a tube can be used to examine your colon directly (sigmoidoscopy or colonoscopy). This is done to check for the earliest forms of colorectal cancer.  Routine screening usually begins at age 57.  Direct examination of the colon should be repeated every 5-10 years through 66 years of age. However, you may need to be screened more often if early forms of precancerous polyps or small growths are found. Skin Cancer  Check your skin from head to toe regularly.  Tell your health care provider about any new moles or changes in moles, especially if there is a change in a mole's shape or color.  Also tell your health care provider if you have a mole that is larger than the size of a pencil eraser.  Always use sunscreen. Apply sunscreen liberally and repeatedly throughout the day.  Protect yourself by wearing long sleeves, pants, a wide-brimmed hat, and sunglasses whenever you are outside. HEART DISEASE, DIABETES, AND HIGH BLOOD PRESSURE   High blood pressure causes heart disease and increases the risk of stroke. High blood pressure is more likely to develop in:  People who have blood pressure in the high end  of the normal range (130-139/85-89 mm Hg).  People who are overweight or obese.  People who are African American.  If you are 109-36 years of age, have your blood pressure checked every 3-5 years. If you are 40 years of age or older, have your blood pressure checked every year. You should have your blood pressure measured twice--once when you are at a hospital or clinic, and once when you are not at a hospital or clinic. Record the average of the two measurements. To check your blood pressure when you are not at a hospital or clinic, you can use:  An automated blood pressure machine at a pharmacy.  A home blood pressure monitor.  If you are between 10 years and 60 years old, ask your health care provider if you should take aspirin to prevent strokes.  Have regular diabetes screenings. This involves taking a blood sample to check your fasting blood sugar level.  If you are at a normal weight and have a low risk for diabetes, have this test once every three years after 66 years of age.  If you are overweight and have a high risk for diabetes, consider being tested at a younger age or more often. PREVENTING INFECTION  Hepatitis B  If you have a higher risk for hepatitis B, you should be screened for this virus. You are considered at high risk for hepatitis B if:  You were born in a country where hepatitis B is common. Ask your health care provider which countries are considered high risk.  Your parents were born in a high-risk country, and you have not been immunized against hepatitis B (hepatitis B vaccine).  You have HIV or AIDS.  You use needles to inject street drugs.  You live with someone who has hepatitis B.  You have had sex with someone who has hepatitis B.  You get hemodialysis treatment.  You take certain medicines for conditions, including cancer, organ transplantation, and autoimmune conditions. Hepatitis C  Blood testing is recommended for:  Everyone born from  [birth year] through 1967.  Anyone with known risk factors for hepatitis C. Sexually transmitted infections (STIs)  You should be screened for sexually transmitted infections (STIs) including gonorrhea and chlamydia if:  You are sexually active and are younger than 66 years of age.  You are older than 66 years of age and your health care provider tells you that you are at risk for this type of infection.  Your sexual activity has changed since you were last screened and you are  at an increased risk for chlamydia or gonorrhea. Ask your health care provider if you are at risk.  If you do not have HIV, but are at risk, it may be recommended that you take a prescription medicine daily to prevent HIV infection. This is called pre-exposure prophylaxis (PrEP). You are considered at risk if:  You are sexually active and do not regularly use condoms or know the HIV status of your partner(s).  You take drugs by injection.  You are sexually active with a partner who has HIV. Talk with your health care provider about whether you are at high risk of being infected with HIV. If you choose to begin PrEP, you should first be tested for HIV. You should then be tested every 3 months for as long as you are taking PrEP.  PREGNANCY   If you are premenopausal and you may become pregnant, ask your health care provider about preconception counseling.  If you may become pregnant, take 400 to 800 micrograms (mcg) of folic acid every day.  If you want to prevent pregnancy, talk to your health care provider about birth control (contraception). OSTEOPOROSIS AND MENOPAUSE   Osteoporosis is a disease in which the bones lose minerals and strength with aging. This can result in serious bone fractures. Your risk for osteoporosis can be identified using a bone density scan.  If you are 38 years of age or older, or if you are at risk for osteoporosis and fractures, ask your health care provider if you should be screened.  Ask  your health care provider whether you should take a calcium or vitamin D supplement to lower your risk for osteoporosis.  Menopause may have certain physical symptoms and risks.  Hormone replacement therapy may reduce some of these symptoms and risks. Talk to your health care provider about whether hormone replacement therapy is right for you.  HOME CARE INSTRUCTIONS   Schedule regular health, dental, and eye exams.  Stay current with your immunizations.   Do not use any tobacco products including cigarettes, chewing tobacco, or electronic cigarettes.  If you are pregnant, do not drink alcohol.  If you are breastfeeding, limit how much and how often you drink alcohol.  Limit alcohol intake to no more than 1 drink per day for nonpregnant women. One drink equals 12 ounces of beer, 5 ounces of wine, or 1 ounces of hard liquor.  Do not use street drugs.  Do not share needles.  Ask your health care provider for help if you need support or information about quitting drugs.  Tell your health care provider if you often feel depressed.  Tell your health care provider if you have ever been abused or do not feel safe at home.   This information is not intended to replace advice given to you by your health care provider. Make sure you discuss any questions you have with your health care provider.   Document Released: 04/01/2011 Document Revised: 10/07/2014 Document Reviewed: 08/18/2013 Elsevier Interactive Patient Education 2016 [employer].  Smoking Cessation, Tips for Success If you are ready to quit smoking, congratulations! You have chosen to help yourself be healthier. Cigarettes bring nicotine, tar, carbon monoxide, and other irritants into your body. Your lungs, heart, and blood vessels will be able to work better without these poisons. There are many different ways to quit smoking. Nicotine gum, nicotine patches, a nicotine inhaler, or nicotine nasal spray can help with physical  craving. Hypnosis, support groups, and medicines help break the habit  of smoking. WHAT THINGS CAN I DO TO MAKE QUITTING EASIER?  Here are some tips to help you quit for good:  Pick a date when you will quit smoking completely. Tell all of your friends and family about your plan to quit on that date.  Do not try to slowly cut down on the number of cigarettes you are smoking. Pick a quit date and quit smoking completely starting on that day.  Throw away all cigarettes.   Clean and remove all ashtrays from your home, work, and car.  On a card, write down your reasons for quitting. Carry the card with you and read it when you get the urge to smoke.  Cleanse your body of nicotine. Drink enough water and fluids to keep your urine clear or pale yellow. Do this after quitting to flush the nicotine from your body.  Learn to predict your moods. Do not let a bad situation be your excuse to have a cigarette. Some situations in your life might tempt you into wanting a cigarette.  Never have "just one" cigarette. It leads to wanting another and another. Remind yourself of your decision to quit.  Change habits associated with smoking. If you smoked while driving or when feeling stressed, try other activities to replace smoking. Stand up when drinking your coffee. Brush your teeth after eating. Sit in a different chair when you read the paper. Avoid alcohol while trying to quit, and try to drink fewer caffeinated beverages. Alcohol and caffeine may urge you to smoke.  Avoid foods and drinks that can trigger a desire to smoke, such as sugary or spicy foods and alcohol.  Ask people who smoke not to smoke around you.  Have something planned to do right after eating or having a cup of coffee. For example, plan to take a walk or exercise.  Try a relaxation exercise to calm you down and decrease your stress. Remember, you may be tense and nervous for the first 2 weeks after you quit, but this will  pass.  Find new activities to keep your hands busy. Play with a pen, coin, or rubber band. Doodle or draw things on paper.  Brush your teeth right after eating. This will help cut down on the craving for the taste of tobacco after meals. You can also try mouthwash.   Use oral substitutes in place of cigarettes. Try using lemon drops, carrots, cinnamon sticks, or chewing gum. Keep them handy so they are available when you have the urge to smoke.  When you have the urge to smoke, try deep breathing.  Designate your home as a nonsmoking area.  If you are a heavy smoker, ask your health care provider about a prescription for nicotine chewing gum. It can ease your withdrawal from nicotine.  Reward yourself. Set aside the cigarette money you save and buy yourself something nice.  Look for support from others. Join a support group or smoking cessation program. Ask someone at home or at work to help you with your plan to quit smoking.  Always ask yourself, "Do I need this cigarette or is this just a reflex?" Tell yourself, "Today, I choose not to smoke," or "I do not want to smoke." You are reminding yourself of your decision to quit.  Do not replace cigarette smoking with electronic cigarettes (commonly called e-cigarettes). The safety of e-cigarettes is unknown, and some may contain harmful chemicals.  If you relapse, do not give up! Plan ahead and think about what you  will do the next time you get the urge to smoke. HOW WILL I FEEL WHEN I QUIT SMOKING? You may have symptoms of withdrawal because your body is used to nicotine (the addictive substance in cigarettes). You may crave cigarettes, be irritable, feel very hungry, cough often, get headaches, or have difficulty concentrating. The withdrawal symptoms are only temporary. They are strongest when you first quit but will go away within 10-14 days. When withdrawal symptoms occur, stay in control. Think about your reasons for quitting. Remind  yourself that these are signs that your body is healing and getting used to being without cigarettes. Remember that withdrawal symptoms are easier to treat than the major diseases that smoking can cause.  Even after the withdrawal is over, expect periodic urges to smoke. However, these cravings are generally short lived and will go away whether you smoke or not. Do not smoke! WHAT RESOURCES ARE AVAILABLE TO HELP ME QUIT SMOKING? Your health care provider can direct you to community resources or hospitals for support, which may include:  Group support.  Education.  Hypnosis.  Therapy.   This information is not intended to replace advice given to you by your health care provider. Make sure you discuss any questions you have with your health care provider.   Document Released: 06/14/2004 Document Revised: 10/07/2014 Document Reviewed: 03/04/2013 Elsevier Interactive Patient Education [employer].

## 2015-09-29 NOTE — Assessment & Plan Note (Signed)
Symptoms of bronchitis and most likely COPD exacerbation. Start levofloxacin. Refill albuterol. Start sample of Anoro. Continue over-the-counter medications as needed for symptom relief and supportive care. Follow-up if symptoms worsen or fail to improve.

## 2015-09-29 NOTE — Progress Notes (Signed)
Subjective:    Patient ID: Jessica [name], female    DOB: 10/24/1950, 64 y.o.   MRN: [id number]  Chief Complaint  Patient presents with  . Establish Care    CPE, still having wheezing and cough almost done with her cough medication    HPI:  Jessica [name] is a 64 y.o. female who presents today for an annual wellness visit.   1) Health Maintenance -   Diet - Averages about 1-2 meals per day consisting of vegetables, fish, chicken, cheese, and occasional fruit; Occasional fast/processed food; 6-7 cups of caffeine daily usually [employer]  Exercise - Work; no structured exercise outside of work   2) Publishing rights manager / Immunizations:  Dental -- Due to exam  Vision -- Due for exam   Health Maintenance  Topic Date Due  . Hepatitis C Screening  11-06-1950  . COLONOSCOPY  04/28/1999  . MAMMOGRAM  03/25/2014  . DEXA SCAN  04/27/2014  . PNA vac Low Risk Adult (1 of 2 - PCV13) 04/27/2014  . INFLUENZA VACCINE  04/30/2016  . TETANUS/TDAP  02/20/2022  . ZOSTAVAX  Completed    Immunization History  Administered Date(s) Administered  . Influenza Split 07/27/2012  . Pneumococcal Conjugate-13 09/29/2015  . Pneumococcal Polysaccharide-23 02/21/2012  . Td 09/30/2001  . Tdap 02/21/2012  . Zoster 02/09/2013   3.) Coughing/wheezing - continues to experience the associated symptom of cough and wheeze after completing a course of azithromycin and prednisone. Reports feeling of slight improvement after the medication, but continues to cough at about the same rate. Denies fevers. Denies modifying factors that make it better or worse. She does continue to smoke approximately one pack per day.   Allergies  Allergen Reactions  . Chantix [Varenicline] Nausea And Vomiting    dizzy     Outpatient Prescriptions Prior to Visit  Medication Sig Dispense Refill  . atorvastatin (LIPITOR) 20 MG tablet Take 1 tablet (20 mg total) by mouth daily. 90 tablet 3  . baclofen (LIORESAL) 10 MG  tablet Take 1 tablet (10 mg total) by mouth 3 (three) times daily. As needed for muscle spasm 50 tablet 0  . DIGESTIVE ENZYMES PO Take by mouth daily.    . fluticasone (FLONASE) 50 MCG/ACT nasal spray Place 2 sprays into both nostrils daily. 16 g 6  . HYDROcodone-homatropine (HYCODAN) 5-1.5 MG/5ML syrup Take 5 mLs by mouth every 8 (eight) hours as needed for cough. 120 mL 0  . ondansetron (ZOFRAN) 4 MG tablet Take 1 tablet (4 mg total) by mouth every 8 (eight) hours as needed for nausea or vomiting. 30 tablet 0  . oxyCODONE-acetaminophen (PERCOCET) 10-325 MG per tablet Take 1-2 tablets by mouth every 6 (six) hours as needed for pain. MAXIMUM TOTAL ACETAMINOPHEN DOSE IS 4000 MG PER DAY 75 tablet 0  . predniSONE (DELTASONE) 20 MG tablet Take 1 tablet (20 mg total) by mouth 2 (two) times daily with a meal. 10 tablet 0  . sennosides-docusate sodium (SENOKOT-S) 8.6-50 MG tablet Take 2 tablets by mouth daily. 30 tablet 1  . azithromycin (ZITHROMAX) 250 MG tablet Take 2 tablets by mouth for 1 day and then 1 tablet by mouth daily for 4 days. 6 tablet 0  . albuterol (PROVENTIL HFA;VENTOLIN HFA) 108 (90 BASE) MCG/ACT inhaler Inhale 2 puffs into the lungs every 6 (six) hours as needed for wheezing. 1 Inhaler 0   No facility-administered medications prior to visit.     Past Medical History  Diagnosis Date  . GERD (  gastroesophageal reflux disease)   . COPD (chronic obstructive pulmonary disease) ([facility])     infreq exac, continued tobacco abuse  . Osteoarthritis of knee     R knee  . Dyslipidemia   . Arthrosis of left acromioclavicular joint 09/16/2014     Past Surgical History  Procedure Laterality Date  . Appendectomy  1995  . Abdominal hysterectomy  1992  . Right knee surgery  86 & 02    "torn cartledge"  . Tubal ligation    . Mouth surgery      implants     Family History  Problem Relation Age of Onset  . Breast cancer Maternal Aunt   . Hypertension Mother   . Stroke Other   . Heart  disease Mother   . Hypertension Brother   . Multiple sclerosis Father      Social History   Social History  . Marital Status: Married    Spouse Name: N/A  . Number of Children: N/A  . Years of Education: N/A   Occupational History  . Not on file.   Social History Main Topics  . Smoking status: Current Every Day Smoker -- 1.00 packs/day  . Smokeless tobacco: Not on file  . Alcohol Use: Yes     Comment: Infrequently   . Drug Use: No  . Sexual Activity: Not on file   Other Topics Concern  . Not on file   Social History Narrative      Review of Systems  Constitutional: Denies fever, chills, fatigue, or significant weight gain/loss. HENT: Head: Denies headache or neck pain Ears: Denies changes in hearing, ringing in ears, earache, drainage Nose: Denies discharge, stuffiness, itching, nosebleed, sinus pain Throat: Denies sore throat, hoarseness, dry mouth, sores, thrush Eyes: Denies loss/changes in vision, pain, redness, blurry/double vision, flashing lights Cardiovascular: Denies chest pain/discomfort, tightness, palpitations, shortness of breath with activity, difficulty lying down, swelling, sudden awakening with shortness of breath Respiratory: Denies shortness of breath, sputum production, Positive for cough and wheezing  Gastrointestinal: Denies dysphasia, heartburn, change in appetite, nausea, change in bowel habits, rectal bleeding, constipation, diarrhea, yellow skin or eyes Genitourinary: Denies frequency, urgency, burning/pain, blood in urine, incontinence, change in urinary strength. Musculoskeletal: Denies muscle/joint pain, stiffness, back pain, redness or swelling of joints, trauma Skin: Denies rashes, lumps, itching, dryness, color changes, or hair/nail changes Neurological: Denies dizziness, fainting, seizures, weakness, numbness, tingling, tremor Psychiatric - Denies nervousness, stress, depression or memory loss Endocrine: Denies heat or cold  intolerance, sweating, frequent urination, excessive thirst, changes in appetite Hematologic: Denies ease of bruising or bleeding     Objective:    BP 122/70 mmHg  Pulse 75  Temp(Src) 98.2 F (36.8 C) (Oral)  Resp 18  Ht '5\' 2"'$  (1.575 m)  Wt 154 lb (69.854 kg)  BMI 28.16 kg/m2  SpO2 97% Nursing note and vital signs reviewed.  Physical Exam  Constitutional: She is oriented to person, place, and time. She appears well-developed and well-nourished.  HENT:  Head: Normocephalic.  Right Ear: Hearing, tympanic membrane, external ear and ear canal normal.  Left Ear: Hearing, tympanic membrane, external ear and ear canal normal.  Nose: Nose normal.  Mouth/Throat: Uvula is midline, oropharynx is clear and moist and mucous membranes are normal.  Eyes: Conjunctivae and EOM are normal. Pupils are equal, round, and reactive to light.  Neck: Neck supple. No JVD present. No tracheal deviation present. No thyromegaly present.  Cardiovascular: Normal rate, regular rhythm, normal heart sounds and intact distal pulses.  Pulmonary/Chest: Effort normal and breath sounds normal.  Abdominal: Soft. Bowel sounds are normal. She exhibits no distension and no mass. There is no tenderness. There is no rebound and no guarding.  Musculoskeletal: Normal range of motion. She exhibits no edema or tenderness.  Lymphadenopathy:    She has no cervical adenopathy.  Neurological: She is alert and oriented to person, place, and time. She has normal reflexes. No cranial nerve deficit. She exhibits normal muscle tone. Coordination normal.  Skin: Skin is warm and dry.  Psychiatric: She has a normal mood and affect. Her behavior is normal. Judgment and thought content normal.       Assessment & Plan:   Problem List Items Addressed This Visit      Respiratory   COPD (chronic obstructive pulmonary disease) ([place])    Symptoms of bronchitis and most likely COPD exacerbation. Start levofloxacin. Refill albuterol. Start  sample of Anoro. Continue over-the-counter medications as needed for symptom relief and supportive care. Follow-up if symptoms worsen or fail to improve.      Relevant Medications   albuterol (PROVENTIL HFA;VENTOLIN HFA) 108 (90 Base) MCG/ACT inhaler   levofloxacin (LEVAQUIN) 500 MG tablet     Other   Tobacco abuse   Routine general medical examination at a health care facility    1) Anticipatory Guidance: Discussed importance of wearing a seatbelt while driving and not texting while driving; changing batteries in smoke detector at least once annually; wearing suntan lotion when outside; eating a balanced and moderate diet; getting physical activity at least 30 minutes per day.  2) Immunizations / Screenings / Labs:  Prevnar updated today. All other immunizations are up-to-date per recommendations. Due for a dental and vision screen which will be scheduled independently. Obtain hepatitis C antibody for hepatitis C screen. Patient requests Cologuard for colon cancer screening. Due for mammogram with order placed. Due for DEXA with order placed. All other screenings are up-to-date per recommendations. Obtain CBC, CMET, Lipid profile and TSH.   Overall adequate exam with risk factors for cardiovascular disease including hyperlipidemia, sedentary lifestyle, and tobacco use. Continue current dosage of atorvastatin for cholesterol management and follow-up pending lipid profile. Discussed importance of tobacco cessation. Patient indicates she is working with her insurance company to find ways to quit smoking. Did have adverse reactions to Chantix. Additional smoking cessation tips provided. Discussed importance of eating in nutrient dense diet that is moderate and varied and low in saturated/transfer fats. Recommend decreasing intake of sugary beverages. Continue other healthy lifestyle behaviors and choices. Follow-up prevention exam in 1 year. Follow-up office visit pending blood work if necessary.        Relevant Orders   MM DIGITAL SCREENING BILATERAL   DG Bone Density   Comprehensive metabolic panel   CBC   Lipid panel   TSH   Hepatitis C antibody    Other Visit Diagnoses    Encounter for screening mammogram for breast cancer    -  Primary    Relevant Orders    MM DIGITAL SCREENING BILATERAL    Need for vaccination with 13-polyvalent pneumococcal conjugate vaccine        Relevant Orders    Pneumococcal conjugate vaccine 13-valent IM (Completed)

## 2015-10-16 ENCOUNTER — Other Ambulatory Visit (INDEPENDENT_AMBULATORY_CARE_PROVIDER_SITE_OTHER): Payer: BC Managed Care – PPO

## 2015-10-16 DIAGNOSIS — R7989 Other specified abnormal findings of blood chemistry: Secondary | ICD-10-CM | POA: Diagnosis not present

## 2015-10-16 DIAGNOSIS — Z0000 Encounter for general adult medical examination without abnormal findings: Secondary | ICD-10-CM | POA: Diagnosis not present

## 2015-10-16 LAB — CBC
HCT: 46 % (ref 36.0–46.0)
Hemoglobin: 15.2 g/dL — ABNORMAL HIGH (ref 12.0–15.0)
MCHC: 33.1 g/dL (ref 30.0–36.0)
MCV: 88.5 fl (ref 78.0–100.0)
PLATELETS: 246 10*3/uL (ref 150.0–400.0)
RBC: 5.2 Mil/uL — ABNORMAL HIGH (ref 3.87–5.11)
RDW: 13.2 % (ref 11.5–15.5)
WBC: 7.3 10*3/uL (ref 4.0–10.5)

## 2015-10-16 LAB — COMPREHENSIVE METABOLIC PANEL
ALK PHOS: 97 U/L (ref 39–117)
ALT: 13 U/L (ref 0–35)
AST: 15 U/L (ref 0–37)
Albumin: 4.5 g/dL (ref 3.5–5.2)
BUN: 23 mg/dL (ref 6–23)
CO2: 22 mEq/L (ref 19–32)
Calcium: 10.3 mg/dL (ref 8.4–10.5)
Chloride: 107 mEq/L (ref 96–112)
Creatinine, Ser: 1.1 mg/dL (ref 0.40–1.20)
GFR: 52.74 mL/min — ABNORMAL LOW (ref >60.00)
GLUCOSE: 91 mg/dL (ref 70–99)
POTASSIUM: 5.2 meq/L — AB (ref 3.5–5.1)
Sodium: 146 mEq/L — ABNORMAL HIGH (ref 135–145)
TOTAL PROTEIN: 8.2 g/dL (ref 6.0–8.3)
Total Bilirubin: 0.5 mg/dL (ref 0.2–1.2)

## 2015-10-16 LAB — TSH: TSH: 2.56 u[IU]/mL (ref 0.35–4.50)

## 2015-10-16 LAB — LIPID PANEL
Cholesterol: 283 mg/dL — ABNORMAL HIGH (ref 0–200)
HDL: 46 mg/dL (ref >39.00)
NONHDL: 236.96
Total CHOL/HDL Ratio: 6
Triglycerides: 271 mg/dL — ABNORMAL HIGH (ref 0.0–149.0)
VLDL: 54.2 mg/dL — ABNORMAL HIGH (ref 0.0–40.0)

## 2015-10-16 LAB — LDL CHOLESTEROL, DIRECT: Direct LDL: 202 mg/dL

## 2015-10-17 LAB — HEPATITIS C ANTIBODY: HCV Ab: NEGATIVE

## 2015-10-18 ENCOUNTER — Encounter: Payer: Self-pay | Admitting: Family

## 2015-10-18 ENCOUNTER — Other Ambulatory Visit: Payer: Self-pay | Admitting: Family

## 2015-10-18 DIAGNOSIS — E878 Other disorders of electrolyte and fluid balance, not elsewhere classified: Secondary | ICD-10-CM

## 2015-10-18 MED ORDER — ATORVASTATIN CALCIUM 40 MG PO TABS: 20.0000 mg | ORAL_TABLET | Freq: Every day | ORAL | Status: DC

## 2015-10-18 MED ORDER — HYDROCHLOROTHIAZIDE 25 MG PO TABS: 25.0000 mg | ORAL_TABLET | Freq: Every day | ORAL | Status: DC

## 2015-10-21 DIAGNOSIS — Z1212 Encounter for screening for malignant neoplasm of rectum: Secondary | ICD-10-CM | POA: Diagnosis not present

## 2015-10-21 DIAGNOSIS — Z1211 Encounter for screening for malignant neoplasm of colon: Secondary | ICD-10-CM | POA: Diagnosis not present

## 2015-10-21 LAB — COLOGUARD: Cologuard: POSITIVE

## 2015-11-02 ENCOUNTER — Telehealth: Payer: Self-pay | Admitting: Family

## 2015-11-02 DIAGNOSIS — Z1212 Encounter for screening for malignant neoplasm of rectum: Principal | ICD-10-CM

## 2015-11-02 DIAGNOSIS — Z1211 Encounter for screening for malignant neoplasm of colon: Secondary | ICD-10-CM

## 2015-11-02 NOTE — Telephone Encounter (Signed)
I have not received any results.

## 2015-11-02 NOTE — Telephone Encounter (Signed)
Please advise 

## 2015-11-02 NOTE — Telephone Encounter (Signed)
States patient has a positive cologuard.  States to call if do not received results by end of day.

## 2015-11-03 NOTE — Telephone Encounter (Signed)
Called pt back to inform her of the referral and positive cologuard test. She is aware.

## 2015-11-03 NOTE — Telephone Encounter (Signed)
Pt called back. She would like you to call her back on her cell I did transfer her up to gastro because it looked like they tried to call her.

## 2015-11-03 NOTE — Telephone Encounter (Signed)
Tried to call pt to let her know that colonoscopy referral has been put in.

## 2015-11-06 ENCOUNTER — Encounter: Payer: Self-pay | Admitting: Gastroenterology

## 2015-11-06 ENCOUNTER — Telehealth: Payer: Self-pay

## 2015-11-06 NOTE — Telephone Encounter (Signed)
Results for [employer] came back positive for blood. Pt is aware.

## 2015-12-08 ENCOUNTER — Telehealth: Payer: Self-pay | Admitting: Family

## 2015-12-08 ENCOUNTER — Ambulatory Visit (INDEPENDENT_AMBULATORY_CARE_PROVIDER_SITE_OTHER): Payer: BC Managed Care – PPO | Admitting: Internal Medicine

## 2015-12-08 ENCOUNTER — Encounter: Payer: Self-pay | Admitting: Internal Medicine

## 2015-12-08 ENCOUNTER — Ambulatory Visit
Admission: RE | Admit: 2015-12-08 | Discharge: 2015-12-08 | Disposition: A | Discharge status: Home / Self Care | Payer: BC Managed Care – PPO | Source: Ambulatory Visit | Attending: Internal Medicine | Admitting: Internal Medicine

## 2015-12-08 ENCOUNTER — Ambulatory Visit (INDEPENDENT_AMBULATORY_CARE_PROVIDER_SITE_OTHER)
Admission: RE | Admit: 2015-12-08 | Discharge: 2015-12-08 | Disposition: A | Discharge status: Home / Self Care | Payer: BC Managed Care – PPO | Source: Ambulatory Visit | Attending: Internal Medicine | Admitting: Internal Medicine

## 2015-12-08 ENCOUNTER — Ambulatory Visit: Admission: RE | Admit: 2015-12-08 | Payer: BC Managed Care – PPO | Source: Ambulatory Visit

## 2015-12-08 ENCOUNTER — Ambulatory Visit (AMBULATORY_SURGERY_CENTER): Payer: Self-pay | Admitting: *Deleted

## 2015-12-08 VITALS — BP 122/60 | HR 71 | Temp 98.5°F | Resp 14 | Ht 62.0 in | Wt 152.0 lb

## 2015-12-08 VITALS — Ht 62.0 in | Wt 152.2 lb

## 2015-12-08 DIAGNOSIS — Z780 Asymptomatic menopausal state: Secondary | ICD-10-CM | POA: Diagnosis not present

## 2015-12-08 DIAGNOSIS — Z0000 Encounter for general adult medical examination without abnormal findings: Secondary | ICD-10-CM | POA: Diagnosis not present

## 2015-12-08 DIAGNOSIS — Z720 Tobacco use: Secondary | ICD-10-CM

## 2015-12-08 DIAGNOSIS — Z1211 Encounter for screening for malignant neoplasm of colon: Secondary | ICD-10-CM

## 2015-12-08 MED ORDER — VARENICLINE TARTRATE 0.5 MG PO TABS: 0.5000 mg | ORAL_TABLET | Freq: Every day | ORAL | Status: DC

## 2015-12-08 MED ORDER — NA SULFATE-K SULFATE-MG SULF 17.5-3.13-1.6 GM/177ML PO SOLN: ORAL | Status: DC

## 2015-12-08 NOTE — Assessment & Plan Note (Signed)
Given rx for chantix for smoking cessation at once daily dosing. Advised of need for quit date in first month of therapy as well as the recommendation to stay on the medicine for full 3 months for best quit rates. She understands and if she has any problems with nausea she will call and can try bupropion.

## 2015-12-08 NOTE — Progress Notes (Signed)
No allergies to eggs or soy. No problems with anesthesia.  Pt given Emmi instructions for colonoscopy  No oxygen use  No diet drug use  

## 2015-12-08 NOTE — Progress Notes (Signed)
   Subjective:    Patient ID: Jessica [name], female    DOB: [birth year], 65 y.o.   MRN: [id number]  HPI The patient is a 65 YO female coming in to discuss smoking cessation. She has tried chantix in the past and did well at the once daily dosing. When she increased to twice a day she got some nausea with some vomiting. She states that it was working well at once daily for her to stop smoking. She does want to try that again but at the once daily dosing.   Review of Systems  Constitutional: Negative.   Respiratory: Negative.   Cardiovascular: Negative.   Gastrointestinal: Negative.   Musculoskeletal: Negative.   Skin: Negative.       Objective:   Physical Exam  Constitutional: She appears well-developed and well-nourished.  Smells of tobacco smoke  HENT:  Head: Normocephalic and atraumatic.  Cardiovascular: Normal rate and regular rhythm.   Pulmonary/Chest: Effort normal. No respiratory distress. She has no wheezes.  Abdominal: Soft. She exhibits no distension. There is no tenderness.   Filed Vitals:   12/08/15 0853  BP: 122/60  Pulse: 71  Temp: 98.5 F (36.9 C)  TempSrc: Oral  Resp: 14  Height: '5\' 2"'$  (1.575 m)  Weight: 152 lb (68.947 kg)  SpO2: 98%      Assessment & Plan:

## 2015-12-08 NOTE — Telephone Encounter (Signed)
Tried to call pharmacy, no answer. Sat on hold.

## 2015-12-08 NOTE — Telephone Encounter (Signed)
Bryson Ha called from [facility] need to verify direction for varenicline (CHANTIX) 0.5 MG tablet . Please call her back  # [phone]

## 2015-12-08 NOTE — Addendum Note (Signed)
Addended by: Resa Miner R on: 12/08/2015 09:43 AM   Modules accepted: Orders

## 2015-12-08 NOTE — Patient Instructions (Addendum)
We have sent in the chantix to take daily. It is recommended if you are taking chantix to stop smoking that you set a quit date within the first month of treatment. It is recommended that you take the medicine for a full 3 months to help you stay away from smoking.   If you have any problems with it please call the office.   Smoking Cessation, Tips for Success If you are ready to quit smoking, congratulations! You have chosen to help yourself be healthier. Cigarettes bring nicotine, tar, carbon monoxide, and other irritants into your body. Your lungs, heart, and blood vessels will be able to work better without these poisons. There are many different ways to quit smoking. Nicotine gum, nicotine patches, a nicotine inhaler, or nicotine nasal spray can help with physical craving. Hypnosis, support groups, and medicines help break the habit of smoking. WHAT THINGS CAN I DO TO MAKE QUITTING EASIER?  Here are some tips to help you quit for good:  Pick a date when you will quit smoking completely. Tell all of your friends and family about your plan to quit on that date.  Do not try to slowly cut down on the number of cigarettes you are smoking. Pick a quit date and quit smoking completely starting on that day.  Throw away all cigarettes.   Clean and remove all ashtrays from your home, work, and car.  On a card, write down your reasons for quitting. Carry the card with you and read it when you get the urge to smoke.  Cleanse your body of nicotine. Drink enough water and fluids to keep your urine clear or pale yellow. Do this after quitting to flush the nicotine from your body.  Learn to predict your moods. Do not let a bad situation be your excuse to have a cigarette. Some situations in your life might tempt you into wanting a cigarette.  Never have "just one" cigarette. It leads to wanting another and another. Remind yourself of your decision to quit.  Change habits associated with smoking. If  you smoked while driving or when feeling stressed, try other activities to replace smoking. Stand up when drinking your coffee. Brush your teeth after eating. Sit in a different chair when you read the paper. Avoid alcohol while trying to quit, and try to drink fewer caffeinated beverages. Alcohol and caffeine may urge you to smoke.  Avoid foods and drinks that can trigger a desire to smoke, such as sugary or spicy foods and alcohol.  Ask people who smoke not to smoke around you.  Have something planned to do right after eating or having a cup of coffee. For example, plan to take a walk or exercise.  Try a relaxation exercise to calm you down and decrease your stress. Remember, you may be tense and nervous for the first 2 weeks after you quit, but this will pass.  Find new activities to keep your hands busy. Play with a pen, coin, or rubber band. Doodle or draw things on paper.  Brush your teeth right after eating. This will help cut down on the craving for the taste of tobacco after meals. You can also try mouthwash.   Use oral substitutes in place of cigarettes. Try using lemon drops, carrots, cinnamon sticks, or chewing gum. Keep them handy so they are available when you have the urge to smoke.  When you have the urge to smoke, try deep breathing.  Designate your home as a nonsmoking area.  If you are a heavy smoker, ask your health care provider about a prescription for nicotine chewing gum. It can ease your withdrawal from nicotine.  Reward yourself. Set aside the cigarette money you save and buy yourself something nice.  Look for support from others. Join a support group or smoking cessation program. Ask someone at home or at work to help you with your plan to quit smoking.  Always ask yourself, "Do I need this cigarette or is this just a reflex?" Tell yourself, "Today, I choose not to smoke," or "I do not want to smoke." You are reminding yourself of your decision to quit.  Do not  replace cigarette smoking with electronic cigarettes (commonly called e-cigarettes). The safety of e-cigarettes is unknown, and some may contain harmful chemicals.  If you relapse, do not give up! Plan ahead and think about what you will do the next time you get the urge to smoke. HOW WILL I FEEL WHEN I QUIT SMOKING? You may have symptoms of withdrawal because your body is used to nicotine (the addictive substance in cigarettes). You may crave cigarettes, be irritable, feel very hungry, cough often, get headaches, or have difficulty concentrating. The withdrawal symptoms are only temporary. They are strongest when you first quit but will go away within 10-14 days. When withdrawal symptoms occur, stay in control. Think about your reasons for quitting. Remind yourself that these are signs that your body is healing and getting used to being without cigarettes. Remember that withdrawal symptoms are easier to treat than the major diseases that smoking can cause.  Even after the withdrawal is over, expect periodic urges to smoke. However, these cravings are generally short lived and will go away whether you smoke or not. Do not smoke! WHAT RESOURCES ARE AVAILABLE TO HELP ME QUIT SMOKING? Your health care provider can direct you to community resources or hospitals for support, which may include:  Group support.  Education.  Hypnosis.  Therapy.   This information is not intended to replace advice given to you by your health care provider. Make sure you discuss any questions you have with your health care provider.   Document Released: 06/14/2004 Document Revised: 10/07/2014 Document Reviewed: 03/04/2013 Elsevier Interactive Patient Education [employer].

## 2015-12-08 NOTE — Progress Notes (Signed)
Pre visit review using our clinic review tool, if applicable. No additional management support is needed unless otherwise documented below in the visit note. 

## 2015-12-13 ENCOUNTER — Telehealth: Payer: Self-pay | Admitting: Gastroenterology

## 2015-12-13 NOTE — Telephone Encounter (Signed)
Spoke with patient and she is starting Chantix. She is not sure if that is ok since her colonoscopy is next week. Patient instructed it will be ok to start Chantix.

## 2015-12-13 NOTE — Telephone Encounter (Signed)
Left a message for patient to call back. 

## 2015-12-22 ENCOUNTER — Encounter: Payer: Self-pay | Admitting: Gastroenterology

## 2015-12-22 ENCOUNTER — Ambulatory Visit (AMBULATORY_SURGERY_CENTER): Payer: BC Managed Care – PPO | Admitting: Gastroenterology

## 2015-12-22 VITALS — BP 100/61 | HR 64 | Temp 97.5°F | Resp 24 | Ht 62.0 in | Wt 152.0 lb

## 2015-12-22 DIAGNOSIS — D124 Benign neoplasm of descending colon: Secondary | ICD-10-CM

## 2015-12-22 DIAGNOSIS — D128 Benign neoplasm of rectum: Secondary | ICD-10-CM | POA: Diagnosis not present

## 2015-12-22 DIAGNOSIS — D123 Benign neoplasm of transverse colon: Secondary | ICD-10-CM | POA: Diagnosis not present

## 2015-12-22 DIAGNOSIS — D122 Benign neoplasm of ascending colon: Secondary | ICD-10-CM

## 2015-12-22 DIAGNOSIS — J449 Chronic obstructive pulmonary disease, unspecified: Secondary | ICD-10-CM | POA: Diagnosis not present

## 2015-12-22 DIAGNOSIS — Z1211 Encounter for screening for malignant neoplasm of colon: Secondary | ICD-10-CM

## 2015-12-22 MED ORDER — SODIUM CHLORIDE 0.9 % IV SOLN: 500.0000 mL | INTRAVENOUS | Status: DC

## 2015-12-22 NOTE — Progress Notes (Signed)
A/ox3 pleased with MAC, report to Jane rN 

## 2015-12-22 NOTE — Op Note (Signed)
[place] Patient Name: Jessica [name] Procedure Date: 12/22/2015 8:55 AM MRN: [id number] Endoscopist: Remo Lipps P. Havery Moros , MD Age: 65 Referring MD:  Date of Birth: 12-26-1950 Gender: Female Procedure:                Colonoscopy Indications:              Screening for colorectal malignant neoplasm Medicines:                Monitored Anesthesia Care Procedure:                Pre-Anesthesia Assessment:                           - Prior to the procedure, a History and Physical                            was performed, and patient medications and                            allergies were reviewed. The patient's tolerance of                            previous anesthesia was also reviewed. The risks                            and benefits of the procedure and the sedation                            options and risks were discussed with the patient.                            All questions were answered, and informed consent                            was obtained. Prior Anticoagulants: The patient has                            taken no previous anticoagulant or antiplatelet                            agents. ASA Grade Assessment: II - A patient with                            mild systemic disease. After reviewing the risks                            and benefits, the patient was deemed in                            satisfactory condition to undergo the procedure.                           After obtaining informed consent, the colonoscope  was passed under direct vision. Throughout the                            procedure, the patient's blood pressure, pulse, and                            oxygen saturations were monitored continuously. The                            Model PCF-H190L [id number]) scope was introduced                            through the anus and advanced to the the cecum,                            identified by appendiceal  orifice and ileocecal                            valve. The colonoscopy was performed without                            difficulty. The patient tolerated the procedure                            well. The quality of the bowel preparation was                            adequate. The ileocecal valve, appendiceal orifice,                            and rectum were photographed. Scope In: 9:01:38 AM Scope Out: 9:26:31 AM Scope Withdrawal Time: 0 hours 17 minutes 3 seconds  Total Procedure Duration: 0 hours 24 minutes 53 seconds  Findings:      The perianal and digital rectal examinations were normal.      Eight sessile polyps were found in the transverse colon. The polyps were       3 to 8 mm in size. These polyps were removed with a cold snare.       Resection and retrieval were complete.      Two sessile polyps were found in the ascending colon. The polyps were 5       to 6 mm in size. These polyps were removed with a cold snare. Resection       and retrieval were complete.      A 4 mm polyp was found in the descending colon. The polyp was sessile.       The polyp was removed with a cold snare. Resection and retrieval were       complete.      A 4 mm polyp was found in the rectum. The polyp was sessile. The polyp       was removed with a cold snare. Resection and retrieval were complete.      Non-bleeding internal hemorrhoids were found.      The exam was otherwise without abnormality. Retroflexion not performed       given small rectum size. Complications:  No immediate complications. Estimated blood loss:                            Minimal. Estimated Blood Loss:     Estimated blood loss was minimal. Impression:               - Eight 3 to 8 mm polyps in the transverse colon,                            removed with a cold snare. Resected and retrieved.                           - Two 5 to 6 mm polyps in the ascending colon,                            removed with a cold  snare. Resected and retrieved.                           - One 4 mm polyp in the descending colon, removed                            with a cold snare. Resected and retrieved.                           - One 4 mm polyp in the rectum, removed with a cold                            snare. Resected and retrieved.                           - Non-bleeding internal hemorrhoids.                           - The examination was otherwise normal. Recommendation:           - Patient has a contact number available for                            emergencies. The signs and symptoms of potential                            delayed complications were discussed with the                            patient. Return to normal activities tomorrow.                            Written discharge instructions were provided to the                            patient.                           - Resume previous diet.                           -  Continue present medications.                           - No aspirin, ibuprofen, naproxen, or other                            non-steroidal anti-inflammatory drugs for 2 weeks                            after polyp removal.                           - Await pathology results.                           - Repeat colonoscopy is recommended for                            surveillance. The colonoscopy date will be                            determined after pathology results from today's                            exam become available for review. Procedure Code(s):        --- Professional ---                           [id number], Colonoscopy, flexible; with removal of                            tumor(s), polyp(s), or other lesion(s) by snare                            technique CPT copyright 2016 [employer]. All rights reserved. Remo Lipps P. Havery Moros, MD Carlota Raspberry. Rimsha Trembley, MD 12/22/2015 9:32:57 AM This report has been signed electronically. Number of Addenda:  0 Referring MD:      Ples Specter. [place]

## 2015-12-22 NOTE — Patient Instructions (Addendum)
YOU HAD AN ENDOSCOPIC PROCEDURE TODAY AT [facility]:   Refer to the procedure report that was given to you for any specific questions about what was found during the examination.  If the procedure report does not answer your questions, please call your gastroenterologist to clarify.  If you requested that your care partner not be given the details of your procedure findings, then the procedure report has been included in a sealed envelope for you to review at your convenience later.  YOU SHOULD EXPECT: Some feelings of bloating in the abdomen. Passage of more gas than usual.  Walking can help get rid of the air that was put into your GI tract during the procedure and reduce the bloating. If you had a lower endoscopy (such as a colonoscopy or flexible sigmoidoscopy) you may notice spotting of blood in your stool or on the toilet paper. If you underwent a bowel prep for your procedure, you may not have a normal bowel movement for a few days.  Please Note:  You might notice some irritation and congestion in your nose or some drainage.  This is from the oxygen used during your procedure.  There is no need for concern and it should clear up in a day or so.  SYMPTOMS TO REPORT IMMEDIATELY:   Following lower endoscopy (colonoscopy or flexible sigmoidoscopy):  Excessive amounts of blood in the stool  Significant tenderness or worsening of abdominal pains  Swelling of the abdomen that is new, acute  Fever of 100F or higher  For urgent or emergent issues, a gastroenterologist can be reached at any hour by calling [phone].   DIET: Your first meal following the procedure should be a small meal and then it is ok to progress to your normal diet. Heavy or fried foods are harder to digest and may make you feel nauseous or bloated.  Likewise, meals heavy in dairy and vegetables can increase bloating.  Drink plenty of fluids but you should avoid alcoholic beverages for 24  hours.  ACTIVITY:  You should plan to take it easy for the rest of today and you should NOT DRIVE or use heavy machinery until tomorrow (because of the sedation medicines used during the test).    FOLLOW UP: Our staff will call the number listed on your records the next business day following your procedure to check on you and address any questions or concerns that you may have regarding the information given to you following your procedure. If we do not reach you, we will leave a message.  However, if you are feeling well and you are not experiencing any problems, there is no need to return our call.  We will assume that you have returned to your regular daily activities without incident.  If any biopsies were taken you will be contacted by phone or by letter within the next 1-3 weeks.  Please call us at [phone] if you have not heard about the biopsies in 3 weeks.    SIGNATURES/CONFIDENTIALITY: You and/or your care partner have signed paperwork which will be entered into your electronic medical record.  These signatures attest to the fact that that the information above on your After Visit Summary has been reviewed and is understood.  Full responsibility of the confidentiality of this discharge information lies with you and/or your care-partner.  Polyp and hemorrhoid information given. No nonsteroidal anti inflammatory medications for next 2 weeks.

## 2015-12-22 NOTE — Progress Notes (Signed)
Called to room to assist during endoscopic procedure.  Patient ID and intended procedure confirmed with present staff. Received instructions for my participation in the procedure from the performing physician.  

## 2015-12-25 ENCOUNTER — Telehealth: Payer: Self-pay | Admitting: *Deleted

## 2015-12-25 NOTE — Telephone Encounter (Signed)
  Follow up Call-  Call back number 12/22/2015  Post procedure Call Back phone  # [phone]  Permission to leave phone message Yes     Patient questions:  Do you have a fever, pain , or abdominal swelling? No. Pain Score  0 *  Have you tolerated food without any problems? Yes.    Have you been able to return to your normal activities? Yes.    Do you have any questions about your discharge instructions: Diet   No. Medications  No. Follow up visit  No.  Do you have questions or concerns about your Care? No.  Actions: * If pain score is 4 or above: No action needed, pain <4.

## 2016-03-09 ENCOUNTER — Ambulatory Visit (INDEPENDENT_AMBULATORY_CARE_PROVIDER_SITE_OTHER): Payer: BC Managed Care – PPO | Admitting: Family Medicine

## 2016-03-09 ENCOUNTER — Encounter: Payer: Self-pay | Admitting: Family Medicine

## 2016-03-09 VITALS — BP 100/66 | HR 75 | Temp 99.9°F | Resp 20 | Ht 62.0 in | Wt 150.5 lb

## 2016-03-09 DIAGNOSIS — R221 Localized swelling, mass and lump, neck: Secondary | ICD-10-CM | POA: Insufficient documentation

## 2016-03-09 DIAGNOSIS — J441 Chronic obstructive pulmonary disease with (acute) exacerbation: Secondary | ICD-10-CM | POA: Diagnosis not present

## 2016-03-09 MED ORDER — AZITHROMYCIN 250 MG PO TABS: ORAL_TABLET | ORAL | Status: DC

## 2016-03-09 MED ORDER — PREDNISONE 20 MG PO TABS: ORAL_TABLET | ORAL | Status: DC

## 2016-03-09 MED ORDER — GUAIFENESIN-CODEINE 100-10 MG/5ML PO SYRP: 5.0000 mL | ORAL_SOLUTION | Freq: Every evening | ORAL | Status: DC | PRN

## 2016-03-09 NOTE — Assessment & Plan Note (Signed)
Antibiotics given change in sputum, pred taper and albuterol prn.

## 2016-03-09 NOTE — Patient Instructions (Signed)
Complete antibiotics course.  Mucinex DM during the day, and prescription cough suppressant at night.  Quit smoking.  Use albuterol as needed every 4-6 hours for wheeze.  Complete a 6 day taper of prednisone to help with breathing.  Follow up if swelling in right right neck does not resolve with antibiotics.

## 2016-03-09 NOTE — Progress Notes (Signed)
   Subjective:    Patient ID: Jessica [name], female    DOB: 03/15/1951, 65 y.o.   MRN: [id number]  Cough This is a new problem. The current episode started 1 to 4 weeks ago. The problem has been gradually worsening. The problem occurs constantly. The cough is productive of sputum and productive of purulent sputum. Associated symptoms include chills, nasal congestion, postnasal drip, a sore throat and wheezing. Pertinent negatives include no fever, myalgias or shortness of breath. Associated symptoms comments: Bilateral ear pain R>L. The symptoms are aggravated by lying down (keeping her up at night). Risk factors for lung disease include smoking/tobacco exposure. Treatments tried: using albuterol occassionally. The treatment provided mild relief. Her past medical history is significant for COPD and environmental allergies. There is no history of asthma.    Social History /Family History/Past Medical History reviewed and updated if needed. COPD on albuterol prn  Review of Systems  Constitutional: Positive for chills. Negative for fever.  HENT: Positive for postnasal drip and sore throat.   Respiratory: Positive for cough and wheezing. Negative for shortness of breath.   Musculoskeletal: Negative for myalgias.  Allergic/Immunologic: Positive for environmental allergies.       Objective:   Physical Exam  Constitutional: Vital signs are normal. She appears well-developed and well-nourished. She is cooperative.  Non-toxic appearance. She does not appear ill. No distress.  HENT:  Head: Normocephalic.  Right Ear: Hearing, tympanic membrane, external ear and ear canal normal. Tympanic membrane is not erythematous, not retracted and not bulging.  Left Ear: Hearing, tympanic membrane, external ear and ear canal normal. Tympanic membrane is not erythematous, not retracted and not bulging.  Nose: Mucosal edema and rhinorrhea present. Right sinus exhibits no maxillary sinus tenderness and no frontal  sinus tenderness. Left sinus exhibits no maxillary sinus tenderness and no frontal sinus tenderness.  Mouth/Throat: Uvula is midline and mucous membranes are normal. Posterior oropharyngeal erythema present. No oropharyngeal exudate, posterior oropharyngeal edema or tonsillar abscesses.  Eyes: Conjunctivae, EOM and lids are normal. Pupils are equal, round, and reactive to light. Lids are everted and swept, no foreign bodies found.  Neck: Trachea normal and normal range of motion. Neck supple. Carotid bruit is not present. No thyroid mass and no thyromegaly present.    Right neck .Marland Kitchen Golf ball size tender lesion, non mobile  Cardiovascular: Normal rate, regular rhythm, S1 normal, S2 normal, normal heart sounds, intact distal pulses and normal pulses.  Exam reveals no gallop and no friction rub.   No murmur heard. Pulmonary/Chest: Effort normal. No tachypnea. No respiratory distress. She has no decreased breath sounds. She has wheezes in the right upper field, the right middle field, the right lower field, the left upper field, the left middle field and the left lower field. She has no rhonchi. She has no rales.  Neurological: She is alert.  Skin: Skin is warm, dry and intact. No rash noted.  Psychiatric: Her speech is normal and behavior is normal. Judgment normal. Her mood appears not anxious. Cognition and memory are normal. She does not exhibit a depressed mood.          Assessment & Plan:

## 2016-03-09 NOTE — Progress Notes (Signed)
Pre visit review using our clinic review tool, if applicable. No additional management support is needed unless otherwise documented below in the visit note. 

## 2016-03-09 NOTE — Assessment & Plan Note (Signed)
Likely lymphadenopathy due to bacterial infection, but given smoking history tenderness, unilateral nature and size... Make sure to follow up if not resolving with antibiotics.

## 2016-07-17 ENCOUNTER — Other Ambulatory Visit: Payer: Self-pay | Admitting: Internal Medicine

## 2016-11-04 ENCOUNTER — Encounter: Payer: Self-pay | Admitting: Gastroenterology

## 2018-03-25 ENCOUNTER — Encounter: Payer: Self-pay | Admitting: Primary Care

## 2018-03-25 ENCOUNTER — Ambulatory Visit (INDEPENDENT_AMBULATORY_CARE_PROVIDER_SITE_OTHER): Payer: Medicare Other | Admitting: Primary Care

## 2018-03-25 VITALS — BP 116/54 | HR 65 | Temp 98.2°F | Ht 62.0 in | Wt 158.8 lb

## 2018-03-25 DIAGNOSIS — E785 Hyperlipidemia, unspecified: Secondary | ICD-10-CM

## 2018-03-25 DIAGNOSIS — J449 Chronic obstructive pulmonary disease, unspecified: Secondary | ICD-10-CM

## 2018-03-25 DIAGNOSIS — K219 Gastro-esophageal reflux disease without esophagitis: Secondary | ICD-10-CM

## 2018-03-25 DIAGNOSIS — H6122 Impacted cerumen, left ear: Secondary | ICD-10-CM

## 2018-03-25 DIAGNOSIS — Z720 Tobacco use: Secondary | ICD-10-CM | POA: Diagnosis not present

## 2018-03-25 NOTE — Progress Notes (Signed)
Subjective:    Patient ID: Jessica [name], female    DOB: November 07, 1950, 67 y.o.   MRN: [id number]  HPI  Jessica [name] is a 67 year old female who presents today to transfer care from [facility] and discuss the problems mentioned below. Will review old records.  1) Ear Fullness: Located to the left ear. She also notices popping and the sensation of a bubble. Her symptoms began 1 week ago. She does have a history of allergies. She's applied some peroxide in the ear with some popping. She denies fevers. She does use q-tips on occasion.   2) Tobacco Abuse: Current smoker. Smoked cigarettes for 40 years, smokes one pack per day. Previously on Chantix in the past and felt nauseated as she progressed through the starter pack to the 1 mg tablets. She tried it again on the 0.5 mg tablets up to twice daily and did better. She attempted to refill the Chantix again but was unable to get due to insurance. She plans on checking with her insurance for coverage.  3) GERD: Intermittent, using OTC herbal acid ease and doing well.   Review of Systems  Constitutional: Negative for fever.  HENT: Negative for congestion and sore throat.   Respiratory: Negative for shortness of breath.        Chronic cough, no increased cough  Cardiovascular: Negative for chest pain.  Gastrointestinal:       Intermittent gerd       Past Medical History:  Diagnosis Date  . Arthrosis of left acromioclavicular joint 09/16/2014  . COPD (chronic obstructive pulmonary disease) (HCC)    infreq exac, continued tobacco abuse  . Dyslipidemia   . GERD (gastroesophageal reflux disease)   . Osteoarthritis of knee    R knee  . Seasonal allergies      Social History   Socioeconomic History  . Marital status: Married    Spouse name: Not on file  . Number of children: Not on file  . Years of education: Not on file  . Highest education level: Not on file  Occupational History  . Not on file  Social Needs  . Financial resource  strain: Not on file  . Food insecurity:    Worry: Not on file    Inability: Not on file  . Transportation needs:    Medical: Not on file    Non-medical: Not on file  Tobacco Use  . Smoking status: Current Every Day Smoker    Packs/day: 1.00  . Smokeless tobacco: Never Used  Substance and Sexual Activity  . Alcohol use: Yes    Alcohol/week: 0.0 oz    Comment: Infrequently   . Drug use: No  . Sexual activity: Not on file  Lifestyle  . Physical activity:    Days per week: Not on file    Minutes per session: Not on file  . Stress: Not on file  Relationships  . Social connections:    Talks on phone: Not on file    Gets together: Not on file    Attends religious service: Not on file    Active member of club or organization: Not on file    Attends meetings of clubs or organizations: Not on file    Relationship status: Not on file  . Intimate partner violence:    Fear of current or ex partner: Not on file    Emotionally abused: Not on file    Physically abused: Not on file    Forced sexual activity:  Not on file  Other Topics Concern  . Not on file  Social History Narrative  . Not on file    Past Surgical History:  Procedure Laterality Date  . ABDOMINAL HYSTERECTOMY  1992  . APPENDECTOMY  1995  . MOUTH SURGERY     implants  . right knee surgery  88 & 02   "torn cartledge"  . ROTATOR CUFF REPAIR Right 2015  . TUBAL LIGATION  1980    Family History  Problem Relation Age of Onset  . Breast cancer Maternal Aunt   . Hypertension Mother   . Heart disease Mother   . Stroke Other   . Hypertension Brother   . Multiple sclerosis Father   . Colon cancer Neg Hx     Allergies  Allergen Reactions  . Chantix [Varenicline] Nausea And Vomiting    Dizzy, nausea only with the higher dose    Current Outpatient Medications on File Prior to Visit  Medication Sig Dispense Refill  . CHANTIX 0.5 MG tablet TAKE 1 TABLET BY MOUTH EVERY DAY (Patient not taking: Reported on  03/25/2018) 90 tablet 0   No current facility-administered medications on file prior to visit.     BP (!) 116/54   Pulse 65   Temp 98.2 F (36.8 C) (Oral)   Ht 5\' 2"  (1.575 m)   Wt 158 lb 12 oz (72 kg)   SpO2 97%   BMI 29.04 kg/m    Objective:   Physical Exam  Constitutional: She appears well-nourished. She does not appear ill.  HENT:  Right Ear: Tympanic membrane and ear canal normal.  Left Ear: Tympanic membrane and ear canal normal.  Nose: No mucosal edema. Right sinus exhibits no maxillary sinus tenderness and no frontal sinus tenderness. Left sinus exhibits no maxillary sinus tenderness and no frontal sinus tenderness.  Mouth/Throat: Oropharynx is clear and moist.  TM covered by cerumen impaction. Canal unremarkable. TM post irrigation without evidence of infection. Left canal with mild erythema  Neck: Neck supple.  Cardiovascular: Normal rate and regular rhythm.  Respiratory: Effort normal and breath sounds normal. She has no wheezes.  Skin: Skin is warm and dry.           Assessment & Plan:  Ear Fullness:  Located to left ear x 1 week. Exam today with cerumen impaction to left canal. TM post irrigation without infection. Left canal with erythema from irritation of cerumen buildup and irrigation. Discussed use of debrox drops in the future, advised against q-tip use. Follow up PRN.  Pleas Koch, NP

## 2018-03-25 NOTE — Assessment & Plan Note (Signed)
Overall stable, manages well with OTC acid ease.

## 2018-03-25 NOTE — Patient Instructions (Signed)
Avoid use of q-tips.  Try Debrox drops in the future for potential wax buildup.  You will be contacted regarding your referral for lung cancer screening.  Please let us know if you have not been contacted within one week.   Change the appointment title to a physical on July 22nd.   It was a pleasure to meet you today! Please don't hesitate to call or message me with any questions. Welcome to [facility]!

## 2018-03-25 NOTE — Assessment & Plan Note (Signed)
40 pack year history. Exam today stable. Referral placed for lung cancer screening.

## 2018-03-25 NOTE — Assessment & Plan Note (Signed)
Smoker for 40 years, 1 PPD.  She is looking into Chantix coverage with her insurance. Referral placed for lung cancer screening.

## 2018-03-25 NOTE — Assessment & Plan Note (Signed)
Will check at upcoming CPE.

## 2018-03-30 ENCOUNTER — Other Ambulatory Visit: Payer: Self-pay | Admitting: Acute Care

## 2018-03-30 DIAGNOSIS — Z122 Encounter for screening for malignant neoplasm of respiratory organs: Secondary | ICD-10-CM

## 2018-03-30 DIAGNOSIS — F17210 Nicotine dependence, cigarettes, uncomplicated: Principal | ICD-10-CM

## 2018-03-30 NOTE — Progress Notes (Signed)
Chest  

## 2018-04-08 ENCOUNTER — Telehealth: Payer: Self-pay

## 2018-04-08 ENCOUNTER — Ambulatory Visit (INDEPENDENT_AMBULATORY_CARE_PROVIDER_SITE_OTHER)
Admission: RE | Admit: 2018-04-08 | Discharge: 2018-04-08 | Disposition: A | Discharge status: Home / Self Care | Payer: Medicare Other | Source: Ambulatory Visit | Attending: Acute Care | Admitting: Acute Care

## 2018-04-08 ENCOUNTER — Encounter: Payer: Self-pay | Admitting: Acute Care

## 2018-04-08 ENCOUNTER — Ambulatory Visit (INDEPENDENT_AMBULATORY_CARE_PROVIDER_SITE_OTHER): Payer: Medicare Other | Admitting: Acute Care

## 2018-04-08 DIAGNOSIS — F17210 Nicotine dependence, cigarettes, uncomplicated: Secondary | ICD-10-CM

## 2018-04-08 DIAGNOSIS — Z122 Encounter for screening for malignant neoplasm of respiratory organs: Secondary | ICD-10-CM

## 2018-04-08 NOTE — Telephone Encounter (Signed)
We need so much more information: What test did they have done? ABI's? Where did this patient have this evaluation done? Office? Who ordered? Are they following up with this? What is the NP's request? Will they be sending a report?

## 2018-04-08 NOTE — Telephone Encounter (Signed)
Copied from [place] [phone]. Topic: General - Other >> Apr 07, 2018  5:14 PM Yvette Rack wrote: Reason for CRM: Nurse Practitioner Starling Manns states pt had PAD and there were significant readings in both legs. Cb# [phone]

## 2018-04-08 NOTE — Progress Notes (Signed)
Shared Decision Making Visit Lung Cancer Screening Program [phone])   Eligibility:  Age 67 y.o.  Pack Years Smoking History Calculation 51 pack year smoking history (# packs/per year x # years smoked)  Recent History of coughing up blood  no  Unexplained weight loss? no ( >Than 15 pounds within the last 6 months )  Prior History Lung / other cancer no (Diagnosis within the last 5 years already requiring surveillance chest CT Scans).  Smoking Status Current Smoker  Former Smokers: Years since quit:NA  Quit Date: NA  Visit Components:  Discussion included one or more decision making aids. yes  Discussion included risk/benefits of screening. yes  Discussion included potential follow up diagnostic testing for abnormal scans. yes  Discussion included meaning and risk of over diagnosis. yes  Discussion included meaning and risk of False Positives. yes  Discussion included meaning of total radiation exposure. yes  Counseling Included:  Importance of adherence to annual lung cancer LDCT screening. yes  Impact of comorbidities on ability to participate in the program. yes  Ability and willingness to under diagnostic treatment. yes  Smoking Cessation Counseling:  Current Smokers:   Discussed importance of smoking cessation. yes  Information about tobacco cessation classes and interventions provided to patient. yes  Patient provided with "ticket" for LDCT Scan. yes  Symptomatic Patient. no  Counseling  Diagnosis Code: Tobacco Use Z72.0  Asymptomatic Patient yes  Counseling (Intermediate counseling: > three minutes counseling) [id number]  Former Smokers:   Discussed the importance of maintaining cigarette abstinence. yes  Diagnosis Code: Personal History of Nicotine Dependence. M08.676  Information about tobacco cessation classes and interventions provided to patient. Yes  Patient provided with "ticket" for LDCT Scan. yes  Written Order for Lung Cancer  Screening with LDCT placed in Epic. Yes (CT Chest Lung Cancer Screening Low Dose W/O CM) [id number] Z12.2-Screening of respiratory organs Z87.891-Personal history of nicotine dependence  I have spent 25 minutes of face to face time with Ms. Defino discussing the risks and benefits of lung cancer screening. We viewed a power point together that explained in detail the above noted topics. We paused at intervals to allow for questions to be asked and answered to ensure understanding.We discussed that the single most powerful action that she can take to decrease her risk of developing lung cancer is to quit smoking. We discussed whether or not she is ready to commit to setting a quit date. She is not ready to set a quit date, but she does have a prescription for Chantix. She states she is having to determine if she can afford it. We discussed options for tools to aid in quitting smoking including nicotine replacement therapy, non-nicotine medications, support groups, Quit Smart classes, and behavior modification. We discussed that often times setting smaller, more achievable goals, such as eliminating 1 cigarette a day for a week and then 2 cigarettes a day for a week can be helpful in slowly decreasing the number of cigarettes smoked. This allows for a sense of accomplishment as well as providing a clinical benefit. I gave her the " Be Stronger Than Your Excuses" card with contact information for community resources, classes, free nicotine replacement therapy, and access to mobile apps, text messaging, and on-line smoking cessation help. I have also given her my card and contact information in the event she needs to contact me. We discussed the time and location of the scan, and that either Doroteo Glassman RN or I will call with the results within 24-48  hours of receiving them. I have offered her  a copy of the power point we viewed  as a resource in the event they need reinforcement of the concepts we discussed today  in the office. The patient verbalized understanding of all of  the above and had no further questions upon leaving the office. They have my contact information in the event they have any further questions.  I spent 4 minutes counseling on smoking cessation and the health risks of continued tobacco abuse.  I explained to the patient that there has been a high incidence of coronary artery disease noted on these exams. I explained that this is a non-gated exam therefore degree or severity cannot be determined. This patient is not on statin therapy. I have asked the patient to follow-up with their PCP regarding any incidental finding of coronary artery disease and management with diet or medication as their PCP  feels is clinically indicated. The patient verbalized understanding of the above and had no further questions upon completion of the visit.      Magdalen Spatz, NP 04/08/2018 11:51 AM

## 2018-04-09 ENCOUNTER — Other Ambulatory Visit: Payer: Self-pay | Admitting: Acute Care

## 2018-04-09 DIAGNOSIS — F17210 Nicotine dependence, cigarettes, uncomplicated: Principal | ICD-10-CM

## 2018-04-09 DIAGNOSIS — Z122 Encounter for screening for malignant neoplasm of respiratory organs: Secondary | ICD-10-CM

## 2018-04-09 NOTE — Telephone Encounter (Signed)
Noted, will evaluate at upcoming visit.

## 2018-04-09 NOTE — Telephone Encounter (Signed)
What test did they have done? ABI's? This was done yesterday.  Where did this patient have this evaluation done? Office? This done at the patient's home. [facility] does this screening in home for her once a year.  Who ordered? Are they following up with this? It was order the [facility]. Is recommended to follow up with pcp first if there is any findings.  What is the NP's request? She is not requesting anything. Just letting pcp aware.   Will they be sending a report? This report will be mailed to patient's pcp and patient also has a copy that she will bring on her appointment.

## 2018-04-20 ENCOUNTER — Ambulatory Visit (INDEPENDENT_AMBULATORY_CARE_PROVIDER_SITE_OTHER): Payer: Medicare Other | Admitting: Primary Care

## 2018-04-20 ENCOUNTER — Encounter: Payer: Self-pay | Admitting: Primary Care

## 2018-04-20 VITALS — BP 108/68 | HR 70 | Temp 98.2°F | Ht 62.0 in | Wt 157.0 lb

## 2018-04-20 DIAGNOSIS — E785 Hyperlipidemia, unspecified: Secondary | ICD-10-CM | POA: Diagnosis not present

## 2018-04-20 DIAGNOSIS — Z1231 Encounter for screening mammogram for malignant neoplasm of breast: Secondary | ICD-10-CM

## 2018-04-20 DIAGNOSIS — Z0000 Encounter for general adult medical examination without abnormal findings: Secondary | ICD-10-CM | POA: Diagnosis not present

## 2018-04-20 DIAGNOSIS — Z720 Tobacco use: Secondary | ICD-10-CM

## 2018-04-20 DIAGNOSIS — J449 Chronic obstructive pulmonary disease, unspecified: Secondary | ICD-10-CM

## 2018-04-20 DIAGNOSIS — R0989 Other specified symptoms and signs involving the circulatory and respiratory systems: Secondary | ICD-10-CM

## 2018-04-20 DIAGNOSIS — Z23 Encounter for immunization: Secondary | ICD-10-CM

## 2018-04-20 DIAGNOSIS — E2839 Other primary ovarian failure: Secondary | ICD-10-CM

## 2018-04-20 DIAGNOSIS — Z1239 Encounter for other screening for malignant neoplasm of breast: Secondary | ICD-10-CM

## 2018-04-20 LAB — COMPREHENSIVE METABOLIC PANEL
ALK PHOS: 85 U/L (ref 39–117)
ALT: 19 U/L (ref 0–35)
AST: 16 U/L (ref 0–37)
Albumin: 4.5 g/dL (ref 3.5–5.2)
BUN: 21 mg/dL (ref 6–23)
CHLORIDE: 104 meq/L (ref 96–112)
CO2: 28 meq/L (ref 19–32)
Calcium: 9.7 mg/dL (ref 8.4–10.5)
Creatinine, Ser: 1.17 mg/dL (ref 0.40–1.20)
GFR: 48.75 mL/min — ABNORMAL LOW (ref >60.00)
GLUCOSE: 82 mg/dL (ref 70–99)
Potassium: 5.1 mEq/L (ref 3.5–5.1)
SODIUM: 140 meq/L (ref 135–145)
TOTAL PROTEIN: 8.2 g/dL (ref 6.0–8.3)
Total Bilirubin: 0.5 mg/dL (ref 0.2–1.2)

## 2018-04-20 LAB — LDL CHOLESTEROL, DIRECT: LDL DIRECT: 172 mg/dL

## 2018-04-20 LAB — LIPID PANEL
CHOL/HDL RATIO: 6
Cholesterol: 243 mg/dL — ABNORMAL HIGH (ref 0–200)
HDL: 41 mg/dL (ref >39.00)
NONHDL: 201.73
Triglycerides: 249 mg/dL — ABNORMAL HIGH (ref 0.0–149.0)
VLDL: 49.8 mg/dL — AB (ref 0.0–40.0)

## 2018-04-20 LAB — HEMOGLOBIN A1C: HEMOGLOBIN A1C: 5.6 % (ref 4.6–6.5)

## 2018-04-20 MED ORDER — ZOSTER VAC RECOMB ADJUVANTED 50 MCG/0.5ML IM SUSR: 0.5000 mL | Freq: Once | INTRAMUSCULAR | 1 refills | Status: AC

## 2018-04-20 MED ORDER — BUPROPION HCL ER (SR) 150 MG PO TB12
150.0000 mg | ORAL_TABLET | Freq: Two times a day (BID) | ORAL | 1 refills | Status: DC
Start: 2018-04-20 — End: 2018-04-20

## 2018-04-20 MED ORDER — BUPROPION HCL ER (SR) 150 MG PO TB12: 150.0000 mg | ORAL_TABLET | Freq: Two times a day (BID) | ORAL | 1 refills | Status: DC

## 2018-04-20 NOTE — Assessment & Plan Note (Signed)
Could not tolerate Chantix in higher doses, also cost prohibitive. Rx for Wellbutrin SR 150 mg sent to pharmacy. Discussed to start with 1 tablet daily x 5 days, then increase to BID dosing thereafter.  Lung cancer screening negative.

## 2018-04-20 NOTE — Progress Notes (Signed)
Subjective:    Patient ID: Jessica [name], female    DOB: June 10, 1951, 67 y.o.   MRN: [id number]  HPI  Jessica [name] is a 67 year old female who presents today for complete physical.t  She recently underwent screening by a nurse from [facility] who reported decreased pedal pulses with decrease in blood flow. She is not managed on aspirin or statin. She denies lower extremity pain with rest or activity.   She has a history of tobacco abuse and is interested in quitting but could not tolerate higher doses of Chantix. She recently underwent lung cancer screening which was negative.   Immunizations: -Tetanus: Completed in 2013 -Influenza: Did not complete last season -Pneumonia: Completed Prevnar in 2016, Pneumovax in 2013 -Shingles: Completed Zoster in 2014  Diet: She endorses a fair diet Breakfast: Skips Lunch: Chicken wrap, fried chicken, french fries, of skips Dinner: Hamburger, chicken, vegetable, starch Snacks: Crackers, cheese-its Desserts: Ice cream, 3 times weekly Beverages: Some soda, seltzer water,sweet tea  Exercise: She is not exercising Eye exam: Completed in 2018 Dental exam: No recent exam Colonoscopy: Completed in 2017 Dexa: Completed in 2017, due. Mammogram: No recent exam.  Hep C Screen: Negative in 2017   Review of Systems  Constitutional: Negative for unexpected weight change.  HENT: Negative for rhinorrhea.   Respiratory: Negative for cough and shortness of breath.   Cardiovascular: Negative for chest pain.  Gastrointestinal: Negative for constipation and diarrhea.  Genitourinary: Negative for difficulty urinating.  Musculoskeletal: Negative for arthralgias and myalgias.  Skin: Negative for rash.  Allergic/Immunologic: Negative for environmental allergies.  Neurological: Negative for dizziness, numbness and headaches.  Psychiatric/Behavioral: The patient is not nervous/anxious.        Past Medical History:  Diagnosis Date  . Arthrosis of  left acromioclavicular joint 09/16/2014  . COPD (chronic obstructive pulmonary disease) (HCC)    infreq exac, continued tobacco abuse  . Dyslipidemia   . GERD (gastroesophageal reflux disease)   . Osteoarthritis of knee    R knee  . Seasonal allergies      Social History   Socioeconomic History  . Marital status: Married    Spouse name: Not on file  . Number of children: Not on file  . Years of education: Not on file  . Highest education level: Not on file  Occupational History  . Not on file  Social Needs  . Financial resource strain: Not on file  . Food insecurity:    Worry: Not on file    Inability: Not on file  . Transportation needs:    Medical: Not on file    Non-medical: Not on file  Tobacco Use  . Smoking status: Current Every Day Smoker    Packs/day: 1.00    Years: 51.00    Pack years: 51.00  . Smokeless tobacco: Never Used  . Tobacco comment: Contemplation phase  Substance and Sexual Activity  . Alcohol use: Yes    Alcohol/week: 0.0 oz    Comment: Infrequently   . Drug use: No  . Sexual activity: Not on file  Lifestyle  . Physical activity:    Days per week: Not on file    Minutes per session: Not on file  . Stress: Not on file  Relationships  . Social connections:    Talks on phone: Not on file    Gets together: Not on file    Attends religious service: Not on file    Active member of club or organization: Not on  file    Attends meetings of clubs or organizations: Not on file    Relationship status: Not on file  . Intimate partner violence:    Fear of current or ex partner: Not on file    Emotionally abused: Not on file    Physically abused: Not on file    Forced sexual activity: Not on file  Other Topics Concern  . Not on file  Social History Narrative  . Not on file    Past Surgical History:  Procedure Laterality Date  . ABDOMINAL HYSTERECTOMY  1992  . APPENDECTOMY  1995  . MOUTH SURGERY     implants  . right knee surgery  88 & 02    "torn cartledge"  . ROTATOR CUFF REPAIR Right 2015  . TUBAL LIGATION  1980    Family History  Problem Relation Age of Onset  . Hypertension Mother   . Heart disease Mother   . Multiple sclerosis Father   . Breast cancer Maternal Aunt   . Stroke Other   . Hypertension Brother   . Colon cancer Neg Hx     Allergies  Allergen Reactions  . Chantix [Varenicline] Nausea And Vomiting    Dizzy, nausea only with the higher dose    No current outpatient medications on file prior to visit.   No current facility-administered medications on file prior to visit.     BP 108/68   Pulse 70   Temp 98.2 F (36.8 C) (Oral)   Ht 5\' 2"  (1.575 m)   Wt 157 lb (71.2 kg)   BMI 28.72 kg/m    Objective:   Physical Exam  Constitutional: She is oriented to person, place, and time. She appears well-nourished.  HENT:  Mouth/Throat: No oropharyngeal exudate.  Eyes: Pupils are equal, round, and reactive to light. EOM are normal.  Neck: Neck supple. No thyromegaly present.  Cardiovascular: Normal rate and regular rhythm.  Respiratory: Effort normal and breath sounds normal.  GI: Soft. Bowel sounds are normal. There is no tenderness.  Musculoskeletal: Normal range of motion.  Neurological: She is alert and oriented to person, place, and time.  Skin: Skin is warm and dry.  Dusky appearance to skin  Psychiatric: She has a normal mood and affect.           Assessment & Plan:

## 2018-04-20 NOTE — Assessment & Plan Note (Addendum)
Immunizations UTD. Rx for Shingrix provided today. Mammogram due, orders placed. Lung cancer screening completed, due next year. Colonoscopy UTD. Discussed the importance of a healthy diet and regular exercise in order for weight loss, and to reduce the risk of any potential medical problems. Exam with decreased pedal pulse to left lower extremity which is likely a chronic finding; otherwise unremarkable. ABI's pending. Labs pending. Follow up in 1 year for CPE.

## 2018-04-20 NOTE — Assessment & Plan Note (Signed)
Lipid panel pending. Do anticipate the need for statin therapy whether for lipid control vs protection against CVD.  History of tobacco abuse with decreased pedal pulses on exam. ABI's pending.

## 2018-04-20 NOTE — Assessment & Plan Note (Signed)
Asymptomatic. Recent lung cancer screening negative. Continue to monitor.

## 2018-04-20 NOTE — Patient Instructions (Addendum)
Stop by the lab prior to leaving today. I will notify you of your results once received.   Call the breast center in [place] to schedule your mammogram and bone density scan.  You will be contacted regarding your blood circulation test.  Please let us know if you have not been contacted within one week.   Take the Shingles vaccination to your pharmacy.   Start bupropion medicatoin for smoking. Take 1 tablet once daily for 5 days, then increase to 1 tablet twice daily thereafter. Please update me in 3-4 weeks.  It was a pleasure to see you today!

## 2018-04-21 ENCOUNTER — Other Ambulatory Visit: Payer: Self-pay | Admitting: Primary Care

## 2018-04-21 DIAGNOSIS — E785 Hyperlipidemia, unspecified: Secondary | ICD-10-CM

## 2018-04-21 MED ORDER — ATORVASTATIN CALCIUM 40 MG PO TABS: ORAL_TABLET | ORAL | 3 refills | Status: DC

## 2018-04-22 ENCOUNTER — Encounter: Payer: Self-pay | Admitting: Primary Care

## 2018-04-22 DIAGNOSIS — Z1231 Encounter for screening mammogram for malignant neoplasm of breast: Secondary | ICD-10-CM | POA: Diagnosis not present

## 2018-04-23 ENCOUNTER — Ambulatory Visit (HOSPITAL_COMMUNITY)
Admission: RE | Admit: 2018-04-23 | Discharge: 2018-04-23 | Disposition: A | Discharge status: Home / Self Care | Payer: Medicare Other | Source: Ambulatory Visit | Attending: Cardiology | Admitting: Cardiology

## 2018-04-23 DIAGNOSIS — R0989 Other specified symptoms and signs involving the circulatory and respiratory systems: Secondary | ICD-10-CM

## 2018-04-29 ENCOUNTER — Ambulatory Visit (INDEPENDENT_AMBULATORY_CARE_PROVIDER_SITE_OTHER)
Admission: RE | Admit: 2018-04-29 | Discharge: 2018-04-29 | Disposition: A | Discharge status: Home / Self Care | Payer: Medicare Other | Source: Ambulatory Visit | Attending: Primary Care | Admitting: Primary Care

## 2018-04-29 DIAGNOSIS — E2839 Other primary ovarian failure: Secondary | ICD-10-CM

## 2018-05-12 ENCOUNTER — Other Ambulatory Visit: Payer: Self-pay | Admitting: Primary Care

## 2018-05-12 DIAGNOSIS — M810 Age-related osteoporosis without current pathological fracture: Secondary | ICD-10-CM

## 2018-05-12 MED ORDER — ALENDRONATE SODIUM 70 MG PO TABS: ORAL_TABLET | ORAL | 3 refills | Status: DC

## 2018-06-02 ENCOUNTER — Other Ambulatory Visit (INDEPENDENT_AMBULATORY_CARE_PROVIDER_SITE_OTHER): Payer: Medicare Other

## 2018-06-02 DIAGNOSIS — E785 Hyperlipidemia, unspecified: Secondary | ICD-10-CM

## 2018-06-02 LAB — HEPATIC FUNCTION PANEL
ALT: 23 U/L (ref 0–35)
AST: 20 U/L (ref 0–37)
Albumin: 4.3 g/dL (ref 3.5–5.2)
Alkaline Phosphatase: 129 U/L — ABNORMAL HIGH (ref 39–117)
BILIRUBIN TOTAL: 0.6 mg/dL (ref 0.2–1.2)
Bilirubin, Direct: 0.1 mg/dL (ref 0.0–0.3)
Total Protein: 7.9 g/dL (ref 6.0–8.3)

## 2018-06-02 LAB — LIPID PANEL
CHOLESTEROL: 162 mg/dL (ref 0–200)
HDL: 39.7 mg/dL (ref >39.00)
LDL CALC: 96 mg/dL (ref 0–99)
NONHDL: 122.66
Total CHOL/HDL Ratio: 4
Triglycerides: 131 mg/dL (ref 0.0–149.0)
VLDL: 26.2 mg/dL (ref 0.0–40.0)

## 2018-06-23 ENCOUNTER — Other Ambulatory Visit: Payer: Self-pay | Admitting: Primary Care

## 2018-06-23 DIAGNOSIS — E785 Hyperlipidemia, unspecified: Secondary | ICD-10-CM

## 2018-07-02 ENCOUNTER — Other Ambulatory Visit (INDEPENDENT_AMBULATORY_CARE_PROVIDER_SITE_OTHER): Payer: Medicare Other

## 2018-07-02 DIAGNOSIS — E785 Hyperlipidemia, unspecified: Secondary | ICD-10-CM | POA: Diagnosis not present

## 2018-07-02 LAB — HEPATIC FUNCTION PANEL
ALT: 30 U/L (ref 0–35)
AST: 21 U/L (ref 0–37)
Albumin: 4.3 g/dL (ref 3.5–5.2)
Alkaline Phosphatase: 119 U/L — ABNORMAL HIGH (ref 39–117)
BILIRUBIN TOTAL: 0.5 mg/dL (ref 0.2–1.2)
Bilirubin, Direct: 0.1 mg/dL (ref 0.0–0.3)
Total Protein: 8.1 g/dL (ref 6.0–8.3)

## 2019-04-25 ENCOUNTER — Other Ambulatory Visit: Payer: Self-pay | Admitting: Primary Care

## 2019-04-25 DIAGNOSIS — M810 Age-related osteoporosis without current pathological fracture: Secondary | ICD-10-CM

## 2019-04-26 NOTE — Telephone Encounter (Signed)
Patient need to schedule an ov for more refills. 

## 2019-04-28 DIAGNOSIS — Z1231 Encounter for screening mammogram for malignant neoplasm of breast: Secondary | ICD-10-CM | POA: Diagnosis not present

## 2019-04-28 DIAGNOSIS — M8589 Other specified disorders of bone density and structure, multiple sites: Secondary | ICD-10-CM | POA: Diagnosis not present

## 2019-04-28 LAB — HM MAMMOGRAPHY

## 2019-04-28 LAB — HM DEXA SCAN

## 2019-04-30 ENCOUNTER — Encounter: Payer: Self-pay | Admitting: Primary Care

## 2019-05-03 ENCOUNTER — Encounter: Payer: Self-pay | Admitting: Primary Care

## 2019-05-05 ENCOUNTER — Telehealth: Payer: Self-pay | Admitting: Primary Care

## 2019-05-05 NOTE — Telephone Encounter (Signed)
Message left for patient to return my call.  

## 2019-05-05 NOTE — Telephone Encounter (Signed)
Please notify patient:  Your bone density test shows osteopenia which is the precursor to osteoporosis. If not already doing so, I recommend you start taking Calcium 1200 mg with Vitamin D 800 units everyday. Weight bearing exercise is also very important in bone strength, make sure to exercise regularly.   We will repeat this test in 2 years.

## 2019-05-10 NOTE — Telephone Encounter (Signed)
Spoken and notified patient of Kate Clark's comments. Patient verbalized understanding.  

## 2019-05-17 ENCOUNTER — Encounter: Payer: Self-pay | Admitting: Primary Care

## 2019-05-17 ENCOUNTER — Ambulatory Visit (INDEPENDENT_AMBULATORY_CARE_PROVIDER_SITE_OTHER): Payer: Medicare Other | Admitting: Primary Care

## 2019-05-17 ENCOUNTER — Other Ambulatory Visit: Payer: Self-pay

## 2019-05-17 VITALS — BP 124/84 | HR 74 | Temp 98.2°F | Ht 62.0 in | Wt 158.2 lb

## 2019-05-17 DIAGNOSIS — M810 Age-related osteoporosis without current pathological fracture: Secondary | ICD-10-CM | POA: Insufficient documentation

## 2019-05-17 DIAGNOSIS — E785 Hyperlipidemia, unspecified: Secondary | ICD-10-CM | POA: Diagnosis not present

## 2019-05-17 DIAGNOSIS — M25521 Pain in right elbow: Secondary | ICD-10-CM | POA: Insufficient documentation

## 2019-05-17 HISTORY — DX: Pain in right elbow: M25.521

## 2019-05-17 LAB — CBC WITH DIFFERENTIAL/PLATELET
Basophils Absolute: 0 10*3/uL (ref 0.0–0.1)
Basophils Relative: 0.6 % (ref 0.0–3.0)
Eosinophils Absolute: 0.1 10*3/uL (ref 0.0–0.7)
Eosinophils Relative: 1.6 % (ref 0.0–5.0)
HCT: 45.1 % (ref 36.0–46.0)
Hemoglobin: 14.8 g/dL (ref 12.0–15.0)
Lymphocytes Relative: 19.9 % (ref 12.0–46.0)
Lymphs Abs: 1.7 10*3/uL (ref 0.7–4.0)
MCHC: 32.8 g/dL (ref 30.0–36.0)
MCV: 91.2 fl (ref 78.0–100.0)
Monocytes Absolute: 0.7 10*3/uL (ref 0.1–1.0)
Monocytes Relative: 7.5 % (ref 3.0–12.0)
Neutro Abs: 6.1 10*3/uL (ref 1.4–7.7)
Neutrophils Relative %: 70.4 % (ref 43.0–77.0)
Platelets: 195 10*3/uL (ref 150.0–400.0)
RBC: 4.95 Mil/uL (ref 3.87–5.11)
RDW: 13.8 % (ref 11.5–15.5)
WBC: 8.6 10*3/uL (ref 4.0–10.5)

## 2019-05-17 LAB — COMPREHENSIVE METABOLIC PANEL
ALT: 26 U/L (ref 0–35)
AST: 22 U/L (ref 0–37)
Albumin: 4.7 g/dL (ref 3.5–5.2)
Alkaline Phosphatase: 72 U/L (ref 39–117)
BUN: 22 mg/dL (ref 6–23)
CO2: 28 mEq/L (ref 19–32)
Calcium: 9.7 mg/dL (ref 8.4–10.5)
Chloride: 105 mEq/L (ref 96–112)
Creatinine, Ser: 1.16 mg/dL (ref 0.40–1.20)
GFR: 46.18 mL/min — ABNORMAL LOW (ref >60.00)
Glucose, Bld: 85 mg/dL (ref 70–99)
Potassium: 4.7 mEq/L (ref 3.5–5.1)
Sodium: 142 mEq/L (ref 135–145)
Total Bilirubin: 0.4 mg/dL (ref 0.2–1.2)
Total Protein: 8.2 g/dL (ref 6.0–8.3)

## 2019-05-17 LAB — URIC ACID: Uric Acid, Serum: 6.4 mg/dL (ref 2.4–7.0)

## 2019-05-17 MED ORDER — ALENDRONATE SODIUM 70 MG PO TABS: ORAL_TABLET | ORAL | 3 refills | Status: DC

## 2019-05-17 MED ORDER — PREDNISONE 10 MG PO TABS: ORAL_TABLET | ORAL | 0 refills | Status: DC

## 2019-05-17 NOTE — Assessment & Plan Note (Signed)
Acute for the last 1-2 weeks, aggravated with movement of bails of hay. Exam today without obvious septic joint, deformity.   Differentials include tendonitis, gout, osteoarthritis, etc. Check labs including CBC, uric acid, renal function.   Rx for prednisone taper sent to pharmacy. Discussed ice, rest. Follow up PRN.

## 2019-05-17 NOTE — Assessment & Plan Note (Signed)
Managed on alendronate, compliant weekly. Bone density scan from 2020 with osteopenia.  Repeat in 2 years. Encouraged weight bearing exercise.

## 2019-05-17 NOTE — Patient Instructions (Addendum)
Stop by the lab prior to leaving today. I will notify you of your results once received.   Start prednisone 10 mg tablets for the elbow pain and inflammation. Take 3 tablets for three days, then 2 tablets for three days, then 1 tablet for three days.   Try to rest your arm/elbow when possible.   Please notify me if your groin pain returns.   It was a pleasure to see you today!

## 2019-05-17 NOTE — Progress Notes (Signed)
Subjective:    Patient ID: Jessica [name], female    DOB: 09-01-1951, 68 y.o.   MRN: [id number]  HPI  Ms. Cabeza is a 68 year old female with a history of hyperlipidemia managed on statin, osteoporosis managed on alendronate, arthrorisis of left AC joint presents today with multiple complaints.  1) Groin Pain: In early July she noticed right groin pain that was intermittent for for a few days. The pain resolved completely, then returned suddenly about 3-4 weeks ago while laying on the couch. The pain lasted just that day and has been resolved since. She described her pain as burning and throbbing.   She denies hematuria, radiation of pain, flank pain, difficulty urinating. She does not have her uterus or ovaries. She has a family history of blood clots and is worried that she may have had one in the groin. She is a smoker. Denies claudication.   2) Elbow Pain: Her pain is located to the right lateral elbow which began one week ago. Her pain was intermittent for the most part but became constant after she picked up two large bails of hay several days ago. She denies swelling, redness, warmth, repetitive movement, injury/trauma. She's taken Tylenol without improvement.   She has a history of tendonitis in the past, this feels similar.   Review of Systems  Musculoskeletal: Positive for arthralgias.  Skin: Negative for color change.  Neurological: Negative for weakness and numbness.       Past Medical History:  Diagnosis Date  . Arthrosis of left acromioclavicular joint 09/16/2014  . COPD (chronic obstructive pulmonary disease) (HCC)    infreq exac, continued tobacco abuse  . Dyslipidemia   . GERD (gastroesophageal reflux disease)   . Osteoarthritis of knee    R knee  . Seasonal allergies      Social History   Socioeconomic History  . Marital status: Married    Spouse name: Not on file  . Number of children: Not on file  . Years of education: Not on file  . Highest  education level: Not on file  Occupational History  . Not on file  Social Needs  . Financial resource strain: Not on file  . Food insecurity    Worry: Not on file    Inability: Not on file  . Transportation needs    Medical: Not on file    Non-medical: Not on file  Tobacco Use  . Smoking status: Current Every Day Smoker    Packs/day: 1.00    Years: 51.00    Pack years: 51.00  . Smokeless tobacco: Never Used  . Tobacco comment: Contemplation phase  Substance and Sexual Activity  . Alcohol use: Yes    Alcohol/week: 0.0 standard drinks    Comment: Infrequently   . Drug use: No  . Sexual activity: Not on file  Lifestyle  . Physical activity    Days per week: Not on file    Minutes per session: Not on file  . Stress: Not on file  Relationships  . Social Herbalist on phone: Not on file    Gets together: Not on file    Attends religious service: Not on file    Active member of club or organization: Not on file    Attends meetings of clubs or organizations: Not on file    Relationship status: Not on file  . Intimate partner violence    Fear of current or ex partner: Not on file  Emotionally abused: Not on file    Physically abused: Not on file    Forced sexual activity: Not on file  Other Topics Concern  . Not on file  Social History Narrative  . Not on file    Past Surgical History:  Procedure Laterality Date  . ABDOMINAL HYSTERECTOMY  1992  . APPENDECTOMY  1995  . MOUTH SURGERY     implants  . right knee surgery  88 & 02   "torn cartledge"  . ROTATOR CUFF REPAIR Right 2015  . TUBAL LIGATION  1980    Family History  Problem Relation Age of Onset  . Hypertension Mother   . Heart disease Mother   . Multiple sclerosis Father   . Breast cancer Maternal Aunt   . Stroke Other   . Hypertension Brother   . Colon cancer Neg Hx     Allergies  Allergen Reactions  . Chantix [Varenicline] Nausea And Vomiting    Dizzy, nausea only with the higher dose     Current Outpatient Medications on File Prior to Visit  Medication Sig Dispense Refill  . atorvastatin (LIPITOR) 40 MG tablet Take 1 tablet by mouth at bedtime for cholesterol. 90 tablet 3  . buPROPion (WELLBUTRIN SR) 150 MG 12 hr tablet Take 1 tablet (150 mg total) by mouth 2 (two) times daily. 60 tablet 1   No current facility-administered medications on file prior to visit.     BP 124/84   Pulse 74   Temp 98.2 F (36.8 C) (Temporal)   Ht 5\' 2"  (1.575 m)   Wt 158 lb 4 oz (71.8 kg)   SpO2 98%   BMI 28.94 kg/m    Objective:   Physical Exam  Constitutional: She appears well-nourished.  Musculoskeletal:     Right elbow: She exhibits normal range of motion and no swelling. Tenderness found.       Arms:     Comments: 5/5 strength to bilateral upper extremities including forearm.   Skin: Skin is warm and dry. No erythema.           Assessment & Plan:

## 2019-05-22 DIAGNOSIS — N183 Chronic kidney disease, stage 3 unspecified: Secondary | ICD-10-CM

## 2019-05-28 ENCOUNTER — Ambulatory Visit
Admission: RE | Admit: 2019-05-28 | Discharge: 2019-05-28 | Disposition: A | Discharge status: Home / Self Care | Payer: Medicare Other | Source: Ambulatory Visit | Attending: Primary Care | Admitting: Primary Care

## 2019-05-28 DIAGNOSIS — N183 Chronic kidney disease, stage 3 unspecified: Secondary | ICD-10-CM

## 2019-05-28 DIAGNOSIS — N281 Cyst of kidney, acquired: Secondary | ICD-10-CM | POA: Diagnosis not present

## 2019-05-31 ENCOUNTER — Telehealth: Payer: Self-pay | Admitting: Primary Care

## 2019-05-31 NOTE — Telephone Encounter (Signed)
Called radiology to verify recent renal ultrasound report.  Spoke with Dr. Polly Cobia regarding right renal cyst who confirmed this is a simple cyst that is benign it does not require any further follow-up.  Attempting to send result note with this information but the chart is locked as he is making his adjustments.  We will notify patient of results as soon as chart becomes available.

## 2019-06-08 ENCOUNTER — Other Ambulatory Visit: Payer: Self-pay

## 2019-06-08 ENCOUNTER — Ambulatory Visit (INDEPENDENT_AMBULATORY_CARE_PROVIDER_SITE_OTHER)
Admission: RE | Admit: 2019-06-08 | Discharge: 2019-06-08 | Disposition: A | Discharge status: Home / Self Care | Payer: Medicare Other | Source: Ambulatory Visit | Attending: Acute Care | Admitting: Acute Care

## 2019-06-08 DIAGNOSIS — Z122 Encounter for screening for malignant neoplasm of respiratory organs: Secondary | ICD-10-CM

## 2019-06-08 DIAGNOSIS — F17210 Nicotine dependence, cigarettes, uncomplicated: Secondary | ICD-10-CM | POA: Diagnosis not present

## 2019-06-11 ENCOUNTER — Other Ambulatory Visit: Payer: Self-pay | Admitting: *Deleted

## 2019-06-11 DIAGNOSIS — Z122 Encounter for screening for malignant neoplasm of respiratory organs: Secondary | ICD-10-CM

## 2019-06-11 DIAGNOSIS — Z87891 Personal history of nicotine dependence: Secondary | ICD-10-CM

## 2019-06-11 DIAGNOSIS — F17210 Nicotine dependence, cigarettes, uncomplicated: Secondary | ICD-10-CM

## 2019-07-28 ENCOUNTER — Encounter: Payer: Self-pay | Admitting: Family Medicine

## 2019-07-28 ENCOUNTER — Ambulatory Visit (INDEPENDENT_AMBULATORY_CARE_PROVIDER_SITE_OTHER): Payer: Medicare Other | Admitting: Family Medicine

## 2019-07-28 ENCOUNTER — Other Ambulatory Visit: Payer: Self-pay

## 2019-07-28 ENCOUNTER — Telehealth: Payer: Self-pay | Admitting: Primary Care

## 2019-07-28 VITALS — BP 122/80 | HR 98 | Temp 97.1°F | Wt 156.0 lb

## 2019-07-28 DIAGNOSIS — D126 Benign neoplasm of colon, unspecified: Secondary | ICD-10-CM

## 2019-07-28 DIAGNOSIS — Z1211 Encounter for screening for malignant neoplasm of colon: Secondary | ICD-10-CM

## 2019-07-28 DIAGNOSIS — R3129 Other microscopic hematuria: Secondary | ICD-10-CM

## 2019-07-28 DIAGNOSIS — R1031 Right lower quadrant pain: Secondary | ICD-10-CM

## 2019-07-28 DIAGNOSIS — R1030 Lower abdominal pain, unspecified: Secondary | ICD-10-CM | POA: Insufficient documentation

## 2019-07-28 DIAGNOSIS — K635 Polyp of colon: Secondary | ICD-10-CM | POA: Insufficient documentation

## 2019-07-28 HISTORY — DX: Other microscopic hematuria: R31.29

## 2019-07-28 LAB — POC URINALSYSI DIPSTICK (AUTOMATED)
Bilirubin, UA: NEGATIVE
Glucose, UA: NEGATIVE
Ketones, UA: NEGATIVE
Nitrite, UA: NEGATIVE
Protein, UA: POSITIVE — AB
Spec Grav, UA: 1.015 (ref 1.010–1.025)
Urobilinogen, UA: 0.2 E.U./dL
pH, UA: 6 (ref 5.0–8.0)

## 2019-07-28 MED ORDER — CEPHALEXIN 500 MG PO CAPS: 500.0000 mg | ORAL_CAPSULE | Freq: Two times a day (BID) | ORAL | 0 refills | Status: DC

## 2019-07-28 NOTE — Telephone Encounter (Signed)
Please notify patient that I spoke with Dr. Glori Bickers, reviewed her colonoscopy report from 2017, and see that she is due for repeat colonoscopy. I recommend she have this done. She saw Dr. Luvenia Starch in 2017. Is she agreeable.

## 2019-07-28 NOTE — Progress Notes (Signed)
Subjective:    Patient ID: Jessica [name], female    DOB: October 08, 1950, 68 y.o.   MRN: [id number]  HPI 68 yo pt of NP Clark here with RLQ abdominal pain  Dull ache  Wt Readings from Last 3 Encounters:  07/28/19 156 lb (70.8 kg)  05/17/19 158 lb 4 oz (71.8 kg)  04/20/18 157 lb (71.2 kg)   28.53 kg/m   Low R abd/pelvis  Very dull and constant  It is hard to get comfortable  Not worse with walking or moving   Otherwise feels ok   Drinks pepsi in am and then water the rest of the day  Some carbonated water - ? If that bothered her and changed back to regular water    No n/v/d No fever  Has bm daily  Has appy and abd hysterectomy (total)  in the past  Of note-during last surgery she was noted to have a lot of adhesions from prior surgeries  colonosocpy 2017-had a lot of polyps  Recall was for 1 year  She does not remember being told that   No urinary symptoms  No blood in urine that she can see No back pain  Trying to drink more water  Has h/o CKD stage 3 Lab Results  Component Value Date   CREATININE 1.16 05/17/2019   BUN 22 05/17/2019   NA 142 05/17/2019   K 4.7 05/17/2019   CL 105 05/17/2019   CO2 28 05/17/2019  last renal US was 8/20   He is a moderate smoker with copd  (does CT program for lung cancer screening)  H/o GERD intermittently  Takes alendronate for OP   Did have R groin pain 1-2 mo ago   Results for orders placed or performed in visit on 07/28/19  POCT Urinalysis Dipstick (Automated)  Result Value Ref Range   Color, UA yellow    Clarity, UA cloudy    Glucose, UA Negative Negative   Bilirubin, UA neg    Ketones, UA neg    Spec Grav, UA 1.015 1.010 - 1.025   Blood, UA large    pH, UA 6.0 5.0 - 8.0   Protein, UA Positive (A) Negative   Urobilinogen, UA 0.2 0.2 or 1.0 E.U./dL   Nitrite, UA neg    Leukocytes, UA Small (1+) (A) Negative     Patient Active Problem List   Diagnosis Date Noted  . Lower abdominal pain 07/28/2019  .  Microscopic hematuria 07/28/2019  . Right elbow pain 05/17/2019  . Osteoporosis without current pathological fracture 05/17/2019  . Routine general medical examination at a health care facility 09/29/2015  . Impingement syndrome of left shoulder 09/16/2014  . Arthrosis of left acromioclavicular joint 09/16/2014  . Dyslipidemia   . GERD (gastroesophageal reflux disease)   . Tobacco abuse 12/19/2011  . COPD (chronic obstructive pulmonary disease) ([place]) 12/19/2011   Past Medical History:  Diagnosis Date  . Arthrosis of left acromioclavicular joint 09/16/2014  . COPD (chronic obstructive pulmonary disease) ([facility])    infreq exac, continued tobacco abuse  . Dyslipidemia   . GERD (gastroesophageal reflux disease)   . Osteoarthritis of knee    R knee  . Seasonal allergies    Past Surgical History:  Procedure Laterality Date  . ABDOMINAL HYSTERECTOMY  1992  . APPENDECTOMY  1995  . MOUTH SURGERY     implants  . right knee surgery  88 & 02   "torn cartledge"  . ROTATOR CUFF REPAIR Right 2015  .  TUBAL LIGATION  1980   Social History   Tobacco Use  . Smoking status: Current Every Day Smoker    Packs/day: 1.00    Years: 51.00    Pack years: 51.00  . Smokeless tobacco: Never Used  . Tobacco comment: Contemplation phase  Substance Use Topics  . Alcohol use: Yes    Alcohol/week: 0.0 standard drinks    Comment: Infrequently   . Drug use: No   Family History  Problem Relation Age of Onset  . Hypertension Mother   . Heart disease Mother   . Multiple sclerosis Father   . Breast cancer Maternal Aunt   . Stroke Other   . Hypertension Brother   . Colon cancer Neg Hx    Allergies  Allergen Reactions  . Chantix [Varenicline] Nausea And Vomiting    Dizzy, nausea only with the higher dose   Current Outpatient Medications on File Prior to Visit  Medication Sig Dispense Refill  . alendronate (FOSAMAX) 70 MG tablet Take 1 tablet by mouth once weekly with a full glass of water on an  empty stomach. Do not lay flat 2 hours after taking. 12 tablet 3  . atorvastatin (LIPITOR) 40 MG tablet Take 1 tablet by mouth at bedtime for cholesterol. 90 tablet 3  . buPROPion (WELLBUTRIN SR) 150 MG 12 hr tablet Take 1 tablet (150 mg total) by mouth 2 (two) times daily. 60 tablet 1   No current facility-administered medications on file prior to visit.     Review of Systems  Constitutional: Negative for activity change, appetite change, fatigue, fever and unexpected weight change.  HENT: Negative for congestion, ear pain, rhinorrhea, sinus pressure and sore throat.   Eyes: Negative for pain, redness and visual disturbance.  Respiratory: Negative for cough, shortness of breath and wheezing.   Cardiovascular: Negative for chest pain and palpitations.  Gastrointestinal: Negative for abdominal distention, abdominal pain, anal bleeding, blood in stool, constipation, diarrhea, nausea, rectal pain and vomiting.  Endocrine: Negative for polydipsia and polyuria.  Genitourinary: Positive for pelvic pain. Negative for dysuria, frequency, hematuria, urgency, vaginal discharge and vaginal pain.  Musculoskeletal: Negative for arthralgias, back pain and myalgias.  Skin: Negative for pallor and rash.  Allergic/Immunologic: Negative for environmental allergies.  Neurological: Negative for dizziness, syncope and headaches.  Hematological: Negative for adenopathy. Does not bruise/bleed easily.  Psychiatric/Behavioral: Negative for decreased concentration and dysphoric mood. The patient is not nervous/anxious.        Objective:   Physical Exam Constitutional:      General: She is not in acute distress.    Appearance: She is well-developed and normal weight. She is not ill-appearing or diaphoretic.  HENT:     Head: Normocephalic and atraumatic.     Mouth/Throat:     Mouth: Mucous membranes are moist.  Eyes:     General: No scleral icterus.    Conjunctiva/sclera: Conjunctivae normal.     Pupils:  Pupils are equal, round, and reactive to light.  Neck:     Musculoskeletal: Normal range of motion and neck supple.  Cardiovascular:     Rate and Rhythm: Normal rate and regular rhythm.     Heart sounds: Normal heart sounds.  Pulmonary:     Effort: Pulmonary effort is normal. No respiratory distress.     Breath sounds: Normal breath sounds. No wheezing or rales.  Abdominal:     General: Abdomen is flat. Bowel sounds are normal. There is no distension.     Palpations: Abdomen is soft.  There is no shifting dullness, hepatomegaly, splenomegaly, mass or pulsatile mass.     Tenderness: There is abdominal tenderness in the right lower quadrant and suprapubic area. There is no right CVA tenderness, left CVA tenderness, guarding or rebound. Negative signs include McBurney's sign.     Hernia: No hernia is present.  Musculoskeletal:     Right lower leg: No edema.     Left lower leg: No edema.     Comments: Nl int and ext rot of hip w/o worsened groin pain No apparent hernia noted  Lymphadenopathy:     Cervical: No cervical adenopathy.  Skin:    General: Skin is warm and dry.     Coloration: Skin is not pale.     Findings: No erythema or rash.  Neurological:     Mental Status: She is alert.     Coordination: Coordination normal.  Psychiatric:        Mood and Affect: Mood normal.           Assessment & Plan:   Problem List Items Addressed This Visit      Digestive   Colon polyps    Many polyps on first colonoscopy 2017 It was recommended per chart a 1 y recall Pt states she was not aware nor did she rec a reminder  She is not interested in doing this but will disc further with her pcp Given low abd pain- I think it would be wise        Genitourinary   Microscopic hematuria    Urine cx sent  Disc poss of uti or renal stone or other etiology      Relevant Orders   Urine Culture     Other   Lower abdominal pain - Primary    Worse on the R / no rebound or guarding Some  wbc/rbc on ua -sent for cx Cover empirically with keflex  Ins to call if symptoms worsen If neg cx or no imp-consider CT scan Disc poss of renal stone/adhesions or other cause Also she is overdue for colonoscopy (polyps) and does not want to do it      Relevant Orders   Urine Culture    Other Visit Diagnoses    Right lower quadrant abdominal pain       Relevant Orders   POCT Urinalysis Dipstick (Automated) (Completed)

## 2019-07-28 NOTE — Telephone Encounter (Signed)
Spoken and notified patient of Jessica [name] comments. Patient verbalized understanding. Patient is agreeable

## 2019-07-28 NOTE — Telephone Encounter (Signed)
Noted, referral placed to GI for repeat colonoscopy.

## 2019-07-28 NOTE — Patient Instructions (Addendum)
Increase your water intake as much as you can  Take the keflex as directed   If symptoms suddenly worsen or change-let us know (go to ER if severe)   I sent your urine for a culture to look for infection  When that returns we will check back with you and make a plan from there  You are due for another colonoscopy

## 2019-07-28 NOTE — Assessment & Plan Note (Signed)
Urine cx sent  Disc poss of uti or renal stone or other etiology

## 2019-07-28 NOTE — Assessment & Plan Note (Signed)
Many polyps on first colonoscopy 2017 It was recommended per chart a 1 y recall Pt states she was not aware nor did she rec a reminder  She is not interested in doing this but will disc further with her pcp Given low abd pain- I think it would be wise

## 2019-07-28 NOTE — Assessment & Plan Note (Signed)
Worse on the R / no rebound or guarding Some wbc/rbc on ua -sent for cx Cover empirically with keflex  Ins to call if symptoms worsen If neg cx or no imp-consider CT scan Disc poss of renal stone/adhesions or other cause Also she is overdue for colonoscopy (polyps) and does not want to do it

## 2019-07-29 ENCOUNTER — Ambulatory Visit (INDEPENDENT_AMBULATORY_CARE_PROVIDER_SITE_OTHER)
Admission: RE | Admit: 2019-07-29 | Discharge: 2019-07-29 | Disposition: A | Discharge status: Home / Self Care | Payer: Medicare Other | Source: Ambulatory Visit | Attending: Family Medicine | Admitting: Family Medicine

## 2019-07-29 ENCOUNTER — Telehealth: Payer: Self-pay | Admitting: Primary Care

## 2019-07-29 ENCOUNTER — Telehealth: Payer: Self-pay | Admitting: Family Medicine

## 2019-07-29 DIAGNOSIS — R3129 Other microscopic hematuria: Secondary | ICD-10-CM

## 2019-07-29 DIAGNOSIS — R1030 Lower abdominal pain, unspecified: Secondary | ICD-10-CM

## 2019-07-29 DIAGNOSIS — R319 Hematuria, unspecified: Secondary | ICD-10-CM

## 2019-07-29 DIAGNOSIS — N200 Calculus of kidney: Secondary | ICD-10-CM | POA: Insufficient documentation

## 2019-07-29 DIAGNOSIS — N132 Hydronephrosis with renal and ureteral calculous obstruction: Secondary | ICD-10-CM | POA: Diagnosis not present

## 2019-07-29 MED ORDER — TAMSULOSIN HCL 0.4 MG PO CAPS: 0.4000 mg | ORAL_CAPSULE | Freq: Every day | ORAL | 0 refills | Status: DC

## 2019-07-29 NOTE — Telephone Encounter (Signed)
Spoke with patient - feels that the vomiting was because of the pain - only had that one episode.  Pt states that she does not feel that she is at the severe point of pain. Pt states that her pain has eased up a little, enough so that she can lay down and rest some.   Pt is requesting some kind of pain medication to help ease the pain so she can get some sleep and be able to rest.  [place] ([place]), [place] - [place] [phone]  Pt denies any changes in her Bowel movements, urine output is good.  Pt states that when she vomited it was normal - no discoloration or blood, normal consistency (bile and food)  Please advise Dr Glori Bickers, thanks.

## 2019-07-29 NOTE — Telephone Encounter (Signed)
Urgent urology referral -sending to [facility] I sent flomax to her [facility] Thanks

## 2019-07-29 NOTE — Telephone Encounter (Signed)
Pt notified and agreed to referral, xfer call to [facility]

## 2019-07-29 NOTE — Telephone Encounter (Signed)
Appt scheduled with Dr Alyson Ingles for tomorrow 07/30/19 at [place], patient aware.

## 2019-07-29 NOTE — Telephone Encounter (Signed)
Pt.notified

## 2019-07-29 NOTE — Telephone Encounter (Signed)
CT scheduled at [place] for today at 3:30pm, patient notified.

## 2019-07-29 NOTE — Telephone Encounter (Signed)
Pt seen 07/28/2019 for right sided pain - advised that if pain worsened to call office or go to the ED if severe Pt called and left a voicemail stating that she is having increased pain in her side and she had a rough night where she did not sleep much at all and ended up vomiting d/t the pain.   ATC patient at number given and the call would not go through. I tried several times - line was quiet then automated messages stated "were sorry you call did not go through"  LM on mobile number to call the office.   Will send to Dr Glori Bickers to see what she recommends based on the information we have since we cannot reach the patient. Not sure if her pain is considered severe at this time. Please advise, thanks.

## 2019-07-29 NOTE — Telephone Encounter (Signed)
I want to get a CT scan if agreeable Will order no contrast (has renal insuff) to look for kidney stone  I will put order in and also route to [facility]

## 2019-07-30 ENCOUNTER — Other Ambulatory Visit: Payer: Self-pay | Admitting: Urology

## 2019-07-30 DIAGNOSIS — N202 Calculus of kidney with calculus of ureter: Secondary | ICD-10-CM | POA: Diagnosis not present

## 2019-07-30 DIAGNOSIS — N200 Calculus of kidney: Secondary | ICD-10-CM | POA: Diagnosis not present

## 2019-07-30 LAB — URINE CULTURE
MICRO NUMBER:: 1040246
SPECIMEN QUALITY:: ADEQUATE

## 2019-07-30 NOTE — Patient Instructions (Addendum)
DUE TO COVID-19 ONLY ONE VISITOR IS ALLOWED TO COME WITH YOU AND STAY IN THE WAITING ROOM ONLY DURING PRE OP AND PROCEDURE DAY OF SURGERY. THE 1 VISITOR MAY VISIT WITH YOU AFTER SURGERY IN YOUR PRIVATE ROOM DURING VISITING HOURS ONLY!    YOUR COVID TEST IS COMPLETED, PLEASE BEGIN THE QUARANTINE INSTRUCTIONS AS OUTLINED IN YOUR HANDOUT.                Jessica [name]   Your procedure is scheduled on: 11-3   Report to [facility] Main  Entrance   Report to admitting at 6:00AM     Call this number if you have problems the morning of surgery [phone]    Remember: Do not eat food or drink liquids :After Midnight. BRUSH YOUR TEETH MORNING OF SURGERY AND RINSE YOUR MOUTH OUT, NO CHEWING GUM CANDY OR MINTS.     Take these medicines the morning of surgery with A SIP OF WATER: KEFLEX, TAMSULOSIN, HYDROCODONE IF NEEDED                                  You may not have any metal on your body including hair pins and              piercings  Do not wear jewelry, make-up, lotions, powders or perfumes, deodorant             Do not wear nail polish on your fingernails.  Do not shave  48 hours prior to surgery.                 Do not bring valuables to the hospital. [place].  Contacts, dentures or bridgework may not be worn into surgery.      Patients discharged the day of surgery will not be allowed to drive home. IF YOU ARE HAVING SURGERY AND GOING HOME THE SAME DAY, YOU MUST HAVE AN ADULT TO DRIVE YOU HOME AND BE WITH YOU FOR 24 HOURS. YOU MAY GO HOME BY TAXI OR UBER OR ORTHERWISE, BUT AN ADULT MUST ACCOMPANY YOU HOME AND STAY WITH YOU FOR 24 HOURS.  Name and phone number of your driver:  Special Instructions: N/A              Please read over the following fact sheets you were given: _____________________________________________________________________             [facility] - Preparing for Surgery Before surgery, you  can play an important role.  Because skin is not sterile, your skin needs to be as free of germs as possible.  You can reduce the number of germs on your skin by washing with CHG (chlorahexidine gluconate) soap before surgery.  CHG is an antiseptic cleaner which kills germs and bonds with the skin to continue killing germs even after washing. Please DO NOT use if you have an allergy to CHG or antibacterial soaps.  If your skin becomes reddened/irritated stop using the CHG and inform your nurse when you arrive at Short Stay. Do not shave (including legs and underarms) for at least 48 hours prior to the first CHG shower.  You may shave your face/neck. Please follow these instructions carefully:  1.  Shower with CHG Soap the night before surgery and the  morning of Surgery.  2.  If you choose to wash your hair, wash your hair first as usual with your  normal  shampoo.  3.  After you shampoo, rinse your hair and body thoroughly to remove the  shampoo.                           4.  Use CHG as you would any other liquid soap.  You can apply chg directly  to the skin and wash                       Gently with a scrungie or clean washcloth.  5.  Apply the CHG Soap to your body ONLY FROM THE NECK DOWN.   Do not use on face/ open                           Wound or open sores. Avoid contact with eyes, ears mouth and genitals (private parts).                       Wash face,  Genitals (private parts) with your normal soap.             6.  Wash thoroughly, paying special attention to the area where your surgery  will be performed.  7.  Thoroughly rinse your body with warm water from the neck down.  8.  DO NOT shower/wash with your normal soap after using and rinsing off  the CHG Soap.                9.  Pat yourself dry with a clean towel.            10.  Wear clean pajamas.            11.  Place clean sheets on your bed the night of your first shower and do not  sleep with pets. Day of Surgery : Do not apply  any lotions/deodorants the morning of surgery.  Please wear clean clothes to the hospital/surgery center.  FAILURE TO FOLLOW THESE INSTRUCTIONS MAY RESULT IN THE CANCELLATION OF YOUR SURGERY PATIENT SIGNATURE_________________________________  NURSE SIGNATURE__________________________________  ________________________________________________________________________

## 2019-07-31 ENCOUNTER — Other Ambulatory Visit (HOSPITAL_COMMUNITY)
Admission: RE | Admit: 2019-07-31 | Discharge: 2019-07-31 | Disposition: A | Discharge status: Home / Self Care | Payer: Medicare Other | Source: Ambulatory Visit | Attending: Urology | Admitting: Urology

## 2019-07-31 DIAGNOSIS — Z20828 Contact with and (suspected) exposure to other viral communicable diseases: Secondary | ICD-10-CM | POA: Diagnosis not present

## 2019-07-31 DIAGNOSIS — Z01812 Encounter for preprocedural laboratory examination: Secondary | ICD-10-CM | POA: Diagnosis not present

## 2019-07-31 LAB — SARS CORONAVIRUS 2 (TAT 6-24 HRS): SARS Coronavirus 2: NEGATIVE

## 2019-08-01 ENCOUNTER — Emergency Department (HOSPITAL_COMMUNITY)
Admission: EM | Admit: 2019-08-01 | Discharge: 2019-08-01 | Disposition: A | Discharge status: Home / Self Care | Payer: Medicare Other | Attending: Emergency Medicine | Admitting: Emergency Medicine

## 2019-08-01 ENCOUNTER — Encounter (HOSPITAL_COMMUNITY): Payer: Self-pay

## 2019-08-01 ENCOUNTER — Emergency Department (HOSPITAL_COMMUNITY): Payer: Medicare Other

## 2019-08-01 ENCOUNTER — Other Ambulatory Visit: Payer: Self-pay

## 2019-08-01 DIAGNOSIS — F17200 Nicotine dependence, unspecified, uncomplicated: Secondary | ICD-10-CM | POA: Insufficient documentation

## 2019-08-01 DIAGNOSIS — J449 Chronic obstructive pulmonary disease, unspecified: Secondary | ICD-10-CM | POA: Insufficient documentation

## 2019-08-01 DIAGNOSIS — N200 Calculus of kidney: Secondary | ICD-10-CM

## 2019-08-01 DIAGNOSIS — N132 Hydronephrosis with renal and ureteral calculous obstruction: Secondary | ICD-10-CM | POA: Diagnosis not present

## 2019-08-01 DIAGNOSIS — Z79899 Other long term (current) drug therapy: Secondary | ICD-10-CM | POA: Insufficient documentation

## 2019-08-01 DIAGNOSIS — R110 Nausea: Secondary | ICD-10-CM

## 2019-08-01 DIAGNOSIS — R109 Unspecified abdominal pain: Secondary | ICD-10-CM | POA: Diagnosis present

## 2019-08-01 LAB — BASIC METABOLIC PANEL
Anion gap: 11 (ref 5–15)
BUN: 23 mg/dL (ref 8–23)
CO2: 25 mmol/L (ref 22–32)
Calcium: 9.3 mg/dL (ref 8.9–10.3)
Chloride: 103 mmol/L (ref 98–111)
Creatinine, Ser: 1.81 mg/dL — ABNORMAL HIGH (ref 0.44–1.00)
GFR calc Af Amer: 32 mL/min — ABNORMAL LOW (ref >60)
GFR calc non Af Amer: 28 mL/min — ABNORMAL LOW (ref >60)
Glucose, Bld: 110 mg/dL — ABNORMAL HIGH (ref 70–99)
Potassium: 4.1 mmol/L (ref 3.5–5.1)
Sodium: 139 mmol/L (ref 135–145)

## 2019-08-01 LAB — CBC WITH DIFFERENTIAL/PLATELET
Abs Immature Granulocytes: 0.03 10*3/uL (ref 0.00–0.07)
Basophils Absolute: 0 10*3/uL (ref 0.0–0.1)
Basophils Relative: 0 %
Eosinophils Absolute: 0.1 10*3/uL (ref 0.0–0.5)
Eosinophils Relative: 1 %
HCT: 43.7 % (ref 36.0–46.0)
Hemoglobin: 13.8 g/dL (ref 12.0–15.0)
Immature Granulocytes: 0 %
Lymphocytes Relative: 15 %
Lymphs Abs: 1.2 10*3/uL (ref 0.7–4.0)
MCH: 29.3 pg (ref 26.0–34.0)
MCHC: 31.6 g/dL (ref 30.0–36.0)
MCV: 92.8 fL (ref 80.0–100.0)
Monocytes Absolute: 0.6 10*3/uL (ref 0.1–1.0)
Monocytes Relative: 7 %
Neutro Abs: 5.9 10*3/uL (ref 1.7–7.7)
Neutrophils Relative %: 77 %
Platelets: 194 10*3/uL (ref 150–400)
RBC: 4.71 MIL/uL (ref 3.87–5.11)
RDW: 12.3 % (ref 11.5–15.5)
WBC: 7.8 10*3/uL (ref 4.0–10.5)
nRBC: 0 % (ref 0.0–0.2)

## 2019-08-01 LAB — URINALYSIS, ROUTINE W REFLEX MICROSCOPIC
Bacteria, UA: NONE SEEN
Bilirubin Urine: NEGATIVE
Glucose, UA: NEGATIVE mg/dL
Ketones, ur: 5 mg/dL — AB
Nitrite: NEGATIVE
Protein, ur: NEGATIVE mg/dL
Specific Gravity, Urine: 1.014 (ref 1.005–1.030)
pH: 5 (ref 5.0–8.0)

## 2019-08-01 MED ORDER — ONDANSETRON 4 MG PO TBDP: 4.0000 mg | ORAL_TABLET | Freq: Three times a day (TID) | ORAL | 0 refills | Status: DC | PRN

## 2019-08-01 MED ORDER — SODIUM CHLORIDE 0.9 % IV BOLUS
1000.0000 mL | Freq: Once | INTRAVENOUS | Status: AC
  Administered 2019-08-01: 1000 mL via INTRAVENOUS

## 2019-08-01 MED ORDER — HYDROCODONE-ACETAMINOPHEN 5-325 MG PO TABS: 1.0000 | ORAL_TABLET | ORAL | 0 refills | Status: DC | PRN

## 2019-08-01 MED ORDER — ONDANSETRON HCL 4 MG/2ML IJ SOLN
4.0000 mg | Freq: Once | INTRAMUSCULAR | Status: AC
  Administered 2019-08-01: 4 mg via INTRAVENOUS
  Filled 2019-08-01: qty 2

## 2019-08-01 MED ORDER — MORPHINE SULFATE (PF) 4 MG/ML IV SOLN
4.0000 mg | Freq: Once | INTRAVENOUS | Status: AC
  Administered 2019-08-01: 4 mg via INTRAVENOUS
  Filled 2019-08-01: qty 1

## 2019-08-01 NOTE — Discharge Instructions (Addendum)
Take the pain medicine as prescribed.  Do not drive or operate heavy machinery while taking the pain medication. Keep your surgical appointment with urology.  Return for any new worsening symptoms

## 2019-08-01 NOTE — ED Provider Notes (Signed)
[facility] DEPT Provider Note   CSN: [id number] Arrival date & time: 08/01/19  1556   History   Chief Complaint Chief Complaint  Patient presents with   Flank Pain   HPI Jessica [name] is a 68 y.o. female with past medical history significant for GERD, COPD who presents for evaluation of flank pain.  Patient states she was seen outpatient by PCP and had CT scan which showed an obstructing 13 mm right UPJ stone with mild to moderate hydronephrosis.  She is scheduled to undergo surgical intervention on 08/03/19 with Dr. Claudia [facility]anctis with [facility].  Patient states pain was controlled prior to today however she has had worsening pain.  She was taking Tylenol for pain PTA as well as Keflex and Flomax.  She has had some mild nausea without emesis.  Denies fever, chills, nausea, vomiting, chest pain, shortness of breath abdominal pain, diarrhea, dysuria, hematuria.  Denies additional aggravating or alleviating factors.  History obtained from patient and past medical records. No interpretor was used.    HPI  Past Medical History:  Diagnosis Date   Arthrosis of left acromioclavicular joint 09/16/2014   COPD (chronic obstructive pulmonary disease) (HCC)    infreq exac, continued tobacco abuse   Dyslipidemia    GERD (gastroesophageal reflux disease)    Osteoarthritis of knee    R knee   Seasonal allergies     Patient Active Problem List   Diagnosis Date Noted   Kidney stone on right side 07/29/2019   Lower abdominal pain 07/28/2019   Microscopic hematuria 07/28/2019   Colon polyps 07/28/2019   Right elbow pain 05/17/2019   Osteoporosis without current pathological fracture 05/17/2019   Routine general medical examination at a health care facility 09/29/2015   Impingement syndrome of left shoulder 09/16/2014   Arthrosis of left acromioclavicular joint 09/16/2014   Dyslipidemia    GERD (gastroesophageal reflux disease)    Tobacco  abuse 12/19/2011   COPD (chronic obstructive pulmonary disease) ([facility]) 12/19/2011    Past Surgical History:  Procedure Laterality Date   ABDOMINAL HYSTERECTOMY  1992   APPENDECTOMY  1995   MOUTH SURGERY     implants   right knee surgery  88 & 02   "torn cartledge"   ROTATOR CUFF REPAIR Right 2015   TUBAL LIGATION  1980    OB History   No obstetric history on file.    Home Medications    Prior to Admission medications   Medication Sig Start Date End Date Taking? Authorizing Provider  alendronate (FOSAMAX) 70 MG tablet Take 1 tablet by mouth once weekly with a full glass of water on an empty stomach. Do not lay flat 2 hours after taking. 05/17/19   Pleas Koch, NP  atorvastatin (LIPITOR) 40 MG tablet Take 1 tablet by mouth at bedtime for cholesterol. Patient not taking: Reported on 07/30/2019 04/21/18   Pleas Koch, NP  buPROPion [facility] SR) 150 MG 12 hr tablet Take 1 tablet (150 mg total) by mouth 2 (two) times daily. Patient not taking: Reported on 07/30/2019 04/20/18   Pleas Koch, NP  cephALEXin (KEFLEX) 500 MG capsule Take 1 capsule (500 mg total) by mouth 2 (two) times daily. 07/28/19   Tower, Wynelle Fanny, MD  HYDROcodone-acetaminophen (NORCO/VICODIN) 5-325 MG tablet Take 1 tablet by mouth every 4 (four) hours as needed. 08/01/19   Anniebelle Devore A, PA-C  ondansetron (ZOFRAN ODT) 4 MG disintegrating tablet Take 1 tablet (4 mg total) by mouth every  8 (eight) hours as needed for nausea or vomiting. 08/01/19   Pedro Whiters A, PA-C  tamsulosin (FLOMAX) 0.4 MG CAPS capsule Take 1 capsule (0.4 mg total) by mouth daily. 07/29/19   Tower, Wynelle Fanny, MD    Family History Family History  Problem Relation Age of Onset   Hypertension Mother    Heart disease Mother    Multiple sclerosis Father    Breast cancer Maternal Aunt    Stroke Other    Hypertension Brother    Colon cancer Neg Hx     Social History Social History   Tobacco Use   Smoking  status: Current Every Day Smoker    Packs/day: 1.00    Years: 51.00    Pack years: 51.00   Smokeless tobacco: Never Used   Tobacco comment: Contemplation phase  Substance Use Topics   Alcohol use: Yes    Alcohol/week: 0.0 standard drinks    Comment: Infrequently    Drug use: No     Allergies   Chantix [varenicline]   Review of Systems Review of Systems  Constitutional: Negative.   HENT: Negative.   Eyes: Negative.   Respiratory: Negative.   Cardiovascular: Negative.   Gastrointestinal: Negative.   Genitourinary: Positive for flank pain. Negative for decreased urine volume, difficulty urinating, dysuria, frequency, genital sores, hematuria, menstrual problem, pelvic pain, urgency, vaginal bleeding, vaginal discharge and vaginal pain.  Musculoskeletal: Negative for gait problem, neck pain and neck stiffness.  Skin: Negative.   Neurological: Negative.   All other systems reviewed and are negative.  Physical Exam Updated Vital Signs BP 108/68    Pulse 72    Temp 99.1 F (37.3 C) (Oral)    Resp 16    SpO2 94%   Physical Exam Vitals signs and nursing note reviewed.  Constitutional:      General: She is not in acute distress.    Appearance: She is well-developed. She is not ill-appearing, toxic-appearing or diaphoretic.  HENT:     Head: Normocephalic and atraumatic.     Nose: Nose normal.     Mouth/Throat:     Mouth: Mucous membranes are moist.     Pharynx: Oropharynx is clear.  Eyes:     Pupils: Pupils are equal, round, and reactive to light.  Neck:     Musculoskeletal: Normal range of motion.  Cardiovascular:     Rate and Rhythm: Normal rate.     Pulses: Normal pulses.          Dorsalis pedis pulses are 2+ on the right side and 2+ on the left side.       Posterior tibial pulses are 2+ on the right side and 2+ on the left side.     Heart sounds: Normal heart sounds.  Pulmonary:     Effort: No respiratory distress.     Breath sounds: Normal breath sounds.    Abdominal:     General: Bowel sounds are normal. There is no distension.     Palpations: Abdomen is soft.     Tenderness: There is no abdominal tenderness. There is no right CVA tenderness, left CVA tenderness, guarding or rebound. Negative signs include Murphy's sign.     Hernia: No hernia is present.     Comments: Soft, nontender without rebound or guarding.  Negative CVA tap bilaterally.  Musculoskeletal: Normal range of motion.     Comments: Moves all 4 extremities at difficulty.  Skin:    General: Skin is warm and dry.  Comments: No rashes or lesions. No erythema, warmth, fluctuance. No vesicular changes.  Neurological:     General: No focal deficit present.     Mental Status: She is alert.     Cranial Nerves: Cranial nerves are intact.     Sensory: Sensation is intact.     Motor: Motor function is intact.     Coordination: Coordination is intact.     Gait: Gait is intact.     Comments: CN 2-12 grossly intact. No facial droop. Ambulatory without difficulty.    ED Treatments / Results  Labs (all labs ordered are listed, but only abnormal results are displayed) Labs Reviewed  BASIC METABOLIC PANEL - Abnormal; Notable for the following components:      Result Value   Glucose, Bld 110 (*)    Creatinine, Ser 1.81 (*)    GFR calc non Af Amer 28 (*)    GFR calc Af Amer 32 (*)    All other components within normal limits  URINALYSIS, ROUTINE W REFLEX MICROSCOPIC - Abnormal; Notable for the following components:   APPearance HAZY (*)    Hgb urine dipstick MODERATE (*)    Ketones, ur 5 (*)    Leukocytes,Ua TRACE (*)    All other components within normal limits  CBC WITH DIFFERENTIAL/PLATELET    EKG None  Radiology US Renal  Result Date: 08/01/2019 CLINICAL DATA:  Right flank pain, kidney stone EXAM: RENAL / URINARY TRACT ULTRASOUND COMPLETE COMPARISON:  CT 07/29/2019 FINDINGS: Right Kidney: Renal measurements: 10.8 x 5.1 x 5.9 cm = volume: 169 mL. Mild right  hydronephrosis, similar to prior CT. There is a 15 mm renal pelvic stone. Lower pole renal stones noted. Normal echotexture. 3.1 cm lower pole cyst. Left Kidney: Renal measurements: 8.8 x 4.8 x 4.7 cm = volume: 104 mL. Echogenicity within normal limits. No mass or hydronephrosis visualized. Bladder: Appears normal for degree of bladder distention. Other: None IMPRESSION: Right renal pelvic and lower pole stones. Mild right hydronephrosis. Electronically Signed   By: Rolm Baptise M.D.   On: 08/01/2019 21:34    Procedures Procedures (including critical care time)  Medications Ordered in ED Medications  morphine 4 MG/ML injection 4 mg (4 mg Intravenous Given 08/01/19 2138)  sodium chloride 0.9 % bolus 1,000 mL (0 mLs Intravenous Stopped 08/01/19 2329)  ondansetron (ZOFRAN) injection 4 mg (4 mg Intravenous Given 08/01/19 2137)   Initial Impression / Assessment and Plan / ED Course  I have reviewed the triage vital signs and the nursing notes.  Pertinent labs & imaging results that were available during my care of the patient were reviewed by me and considered in my medical decision making (see chart for details).  68 year old female presents for evaluation of right flank pain.  She has known 13 mm UPJ stone.  Patient has plans for surgical intervention Tuesday morning with Dr. Claudia Desanctis with [facility]. Afebrile, non septic, non ill appearing.  Abdomen soft, nontender with no rebound or guarding.  Heart and lungs clear.  Negative CVA tap bilaterally.  Plan to pain labs, urinalysis, pain control and reevaluate.  Given CT scan less than 48 hours ago do not think that patient needs repeat CT imaging.  I have low suspicion for some sort of dissection or additional intra-abdominal pathology as cause of her pain.  Labs and imaging personally reviewed No acute abnormalities. [place] with right lower pelvic stones and mild right sided hydronephrosis.  2240: Patient reevaluated. Sleeping, arousalable by voice.  States pain has  resolved. 0/10 currently. Will PO trial. Plan to dc home with pain meds, and antiemetics and follow up with Urology tomorrow as planned.   Patient is nontoxic, nonseptic appearing, in no apparent distress.  Patient's pain and other symptoms adequately managed in emergency department. Labs, imaging and vitals reviewed.  Patient does not meet the SIRS or Sepsis criteria.  On repeat exam patient does not have a surgical abdomin and there are no peritoneal signs.  No indication of appendicitis, bowel obstruction, bowel perforation, cholecystitis, diverticulitis.  2320: Patient tolerated PO trial without difficulty. She voices no current pain. Will for keep your appointment with urology for surgical intervention.  Discussed strict return precautions.  Patient voiced understanding and is agreeable to follow-up.  No evidence of infected stone or evidence of sepsis or sirs.  There is no evidence of significant hydronephrosis, serum creatine WNL, vitals sign stable and the pt does not have irratractable vomiting. Pt will be dc home with pain medications & has been advised to follow up with PCP.       Final Clinical Impressions(s) / ED Diagnoses   Final diagnoses:  Nephrolithiasis  Nausea    ED Discharge Orders         Ordered    HYDROcodone-acetaminophen (NORCO/VICODIN) 5-325 MG tablet  Every 4 hours PRN     08/01/19 2322    ondansetron (ZOFRAN ODT) 4 MG disintegrating tablet  Every 8 hours PRN     08/01/19 2322           Jerric Oyen A, PA-C 08/01/19 2329    Hayden Rasmussen, MD 08/02/19 1112

## 2019-08-01 NOTE — ED Notes (Signed)
Pt ambulatory to and from bathroom with a steady gait. No assistance needed.

## 2019-08-01 NOTE — ED Notes (Signed)
Pt tolerated PO challenge. ED PA made aware.

## 2019-08-01 NOTE — ED Triage Notes (Signed)
Pt states she has right sided kidney stones. Pt states she is scheduled for surgery on Tuesday to remove them ,but her pain has increased significantly. Pt states she is still urinating, but significant pain.

## 2019-08-02 ENCOUNTER — Encounter (HOSPITAL_COMMUNITY)
Admission: RE | Admit: 2019-08-02 | Discharge: 2019-08-02 | Disposition: A | Discharge status: Home / Self Care | Payer: Medicare Other | Source: Ambulatory Visit | Attending: Urology | Admitting: Urology

## 2019-08-02 ENCOUNTER — Encounter (HOSPITAL_COMMUNITY): Payer: Self-pay

## 2019-08-02 ENCOUNTER — Other Ambulatory Visit: Payer: Self-pay

## 2019-08-02 DIAGNOSIS — N202 Calculus of kidney with calculus of ureter: Secondary | ICD-10-CM | POA: Insufficient documentation

## 2019-08-02 DIAGNOSIS — Z01812 Encounter for preprocedural laboratory examination: Secondary | ICD-10-CM | POA: Insufficient documentation

## 2019-08-02 NOTE — H&P (View-Only) (Signed)
CC/HPI: cc: urolithiasis   07/30/19:  68 year old woman who presented to the ER on 07/29/2019 with right flank pain found to have a 13 mm right UPJ calculus as well as a nonobstructing lower pole calculus on the right. This is patient's 1st stone episode. She denies any gross hematuria, fevers, chills, nausea or vomiting. She says she has some dull right lower quadrant pain today. She was given antibiotics and Flomax in the ER.     ALLERGIES: None   MEDICATIONS: Lipitor  Tamsulosin Hcl 0.4 mg capsule  Albuterol Sulfate  Wellbutrin Sr 150 mg tablet,sustained-release 12 hr     GU PSH: Hysterectomy, complete     NON-GU PSH: Appendectomy     GU PMH: None   NON-GU PMH: GERD    FAMILY HISTORY: 1 Daughter - Daughter 1 son - Son   SOCIAL HISTORY: Marital Status: Married Preferred Language: English; Ethnicity: Not Hispanic Or Latino; Race: White Current Smoking Status: Patient smokes.   Tobacco Use Assessment Completed: Used Tobacco in last 30 days? Social Drinker.  Drinks 2 caffeinated drinks per day.    REVIEW OF SYSTEMS:    GU Review Female:   Patient denies frequent urination, hard to postpone urination, burning /pain with urination, get up at night to urinate, leakage of urine, stream starts and stops, trouble starting your stream, have to strain to urinate, and being pregnant.  Gastrointestinal (Upper):   Patient denies nausea, vomiting, and indigestion/ heartburn.  Gastrointestinal (Lower):   Patient denies constipation and diarrhea.  Constitutional:   Patient denies fever, night sweats, weight loss, and fatigue.  Skin:   Patient denies skin rash/ lesion and itching.  Eyes:   Patient denies blurred vision and double vision.  Ears/ Nose/ Throat:   Patient denies sore throat and sinus problems.  Hematologic/Lymphatic:   Patient denies swollen glands and easy bruising.  Cardiovascular:   Patient denies leg swelling and chest pains.  Respiratory:   Patient denies cough and  shortness of breath.  Endocrine:   Patient denies excessive thirst.  Musculoskeletal:   Patient denies back pain and joint pain.  Neurological:   Patient denies headaches and dizziness.  Psychologic:   Patient denies depression and anxiety.   VITAL SIGNS:      07/30/2019 10:41 AM  Weight 156 lb / 70.76 kg  Height 62 in / 157.48 cm  BP 123/77 mmHg  Pulse 97 /min  Temperature 97.5 F / 36.3 C  BMI 28.5 kg/m   MULTI-SYSTEM PHYSICAL EXAMINATION:    Constitutional: Well-nourished. No physical deformities. Normally developed. Good grooming.  Neck: Neck symmetrical, not swollen. Normal tracheal position.  Respiratory: No labored breathing, no use of accessory muscles.   Cardiovascular: Normal temperature, normal extremity pulses, no swelling, no varicosities.  Skin: No paleness, no jaundice, no cyanosis. No lesion, no ulcer, no rash.  Neurologic / Psychiatric: Oriented to time, oriented to place, oriented to person. No depression, no anxiety, no agitation.  Gastrointestinal: No mass, no tenderness, no rigidity, non obese abdomen.   Eyes: Normal conjunctivae. Normal eyelids.  Ears, Nose, Mouth, and Throat: Left ear no scars, no lesions, no masses. Right ear no scars, no lesions, no masses. Nose no scars, no lesions, no masses. Normal hearing. Normal lips.  Musculoskeletal: Spine, ribs, pelvis no bilateral tenderness. Normal gait and station of head and neck.     PAST DATA REVIEWED:  Source Of History:  Patient  Records Review:   POC Tool  Urine Test Review:   Urinalysis  X-Ray  Review: Outside CT: Reviewed Films. Discussed With Patient. Personally reviewed CT scan.   Notes:                     Creatinine 1.16 on 05/17/2019   PROCEDURES: None   ASSESSMENT:      ICD-10 Details  1 GU:   Renal and ureteral calculus - N20.2 Discussed management of UPJ and renal calculus. Recommend cystoscopy with ureteroscopic stone extraction and laser lithotripsy stent placement given size of stone. Risks  and benefits of URS discussed with patient including bleeding, infection, pain, need for a staged procedure, stent discomfort, damage to surrounding structures, inability removed stone, need for future procedures. Will add patient on to surgery early next week. She is given strict return precautions to the ER if she has fever, chills, inability to tolerate p.o. or intractable pain.   PLAN:           Orders Labs CULTURE, URINE          Document Letter(s):  Created for Patient: Clinical Summary         Notes:   Patient to be scheduled for surgery for 08/03/2019.        Next Appointment:      Next Appointment: 08/03/2019 08:00 AM    Appointment Type: Surgery     Location: [facility] [phone]    Provider: Jacalyn Lefevre, M.D.    Reason for Visit: WL/OP CYSTO, (R) URETEROSCOPY, LASER LITHO, STENT PLACEMENT      Signed by Jacalyn Lefevre, M.D. on 07/30/19 at 1:26 PM (EDT)

## 2019-08-02 NOTE — H&P (Signed)
CC/HPI: cc: urolithiasis   07/30/19:  68 year old woman who presented to the ER on 07/29/2019 with right flank pain found to have a 13 mm right UPJ calculus as well as a nonobstructing lower pole calculus on the right. This is patient's 1st stone episode. She denies any gross hematuria, fevers, chills, nausea or vomiting. She says she has some dull right lower quadrant pain today. She was given antibiotics and Flomax in the ER.     ALLERGIES: None   MEDICATIONS: Lipitor  Tamsulosin Hcl 0.4 mg capsule  Albuterol Sulfate  Wellbutrin Sr 150 mg tablet,sustained-release 12 hr     GU PSH: Hysterectomy, complete     NON-GU PSH: Appendectomy     GU PMH: None   NON-GU PMH: GERD    FAMILY HISTORY: 1 Daughter - Daughter 1 son - Son   SOCIAL HISTORY: Marital Status: Married Preferred Language: English; Ethnicity: Not Hispanic Or Latino; Race: White Current Smoking Status: Patient smokes.   Tobacco Use Assessment Completed: Used Tobacco in last 30 days? Social Drinker.  Drinks 2 caffeinated drinks per day.    REVIEW OF SYSTEMS:    GU Review Female:   Patient denies frequent urination, hard to postpone urination, burning /pain with urination, get up at night to urinate, leakage of urine, stream starts and stops, trouble starting your stream, have to strain to urinate, and being pregnant.  Gastrointestinal (Upper):   Patient denies nausea, vomiting, and indigestion/ heartburn.  Gastrointestinal (Lower):   Patient denies constipation and diarrhea.  Constitutional:   Patient denies fever, night sweats, weight loss, and fatigue.  Skin:   Patient denies skin rash/ lesion and itching.  Eyes:   Patient denies blurred vision and double vision.  Ears/ Nose/ Throat:   Patient denies sore throat and sinus problems.  Hematologic/Lymphatic:   Patient denies swollen glands and easy bruising.  Cardiovascular:   Patient denies leg swelling and chest pains.  Respiratory:   Patient denies cough and  shortness of breath.  Endocrine:   Patient denies excessive thirst.  Musculoskeletal:   Patient denies back pain and joint pain.  Neurological:   Patient denies headaches and dizziness.  Psychologic:   Patient denies depression and anxiety.   VITAL SIGNS:      07/30/2019 10:41 AM  Weight 156 lb / 70.76 kg  Height 62 in / 157.48 cm  BP 123/77 mmHg  Pulse 97 /min  Temperature 97.5 F / 36.3 C  BMI 28.5 kg/m   MULTI-SYSTEM PHYSICAL EXAMINATION:    Constitutional: Well-nourished. No physical deformities. Normally developed. Good grooming.  Neck: Neck symmetrical, not swollen. Normal tracheal position.  Respiratory: No labored breathing, no use of accessory muscles.   Cardiovascular: Normal temperature, normal extremity pulses, no swelling, no varicosities.  Skin: No paleness, no jaundice, no cyanosis. No lesion, no ulcer, no rash.  Neurologic / Psychiatric: Oriented to time, oriented to place, oriented to person. No depression, no anxiety, no agitation.  Gastrointestinal: No mass, no tenderness, no rigidity, non obese abdomen.   Eyes: Normal conjunctivae. Normal eyelids.  Ears, Nose, Mouth, and Throat: Left ear no scars, no lesions, no masses. Right ear no scars, no lesions, no masses. Nose no scars, no lesions, no masses. Normal hearing. Normal lips.  Musculoskeletal: Spine, ribs, pelvis no bilateral tenderness. Normal gait and station of head and neck.     PAST DATA REVIEWED:  Source Of History:  Patient  Records Review:   POC Tool  Urine Test Review:   Urinalysis  X-Ray  Review: Outside CT: Reviewed Films. Discussed With Patient. Personally reviewed CT scan.   Notes:                     Creatinine 1.16 on 05/17/2019   PROCEDURES: None   ASSESSMENT:      ICD-10 Details  1 GU:   Renal and ureteral calculus - N20.2 Discussed management of UPJ and renal calculus. Recommend cystoscopy with ureteroscopic stone extraction and laser lithotripsy stent placement given size of stone. Risks  and benefits of URS discussed with patient including bleeding, infection, pain, need for a staged procedure, stent discomfort, damage to surrounding structures, inability removed stone, need for future procedures. Will add patient on to surgery early next week. She is given strict return precautions to the ER if she has fever, chills, inability to tolerate p.o. or intractable pain.   PLAN:           Orders Labs CULTURE, URINE          Document Letter(s):  Created for Patient: Clinical Summary         Notes:   Patient to be scheduled for surgery for 08/03/2019.        Next Appointment:      Next Appointment: 08/03/2019 08:00 AM    Appointment Type: Surgery     Location: [facility] [phone]    Provider: Jacalyn Lefevre, M.D.    Reason for Visit: WL/OP CYSTO, (R) URETEROSCOPY, LASER LITHO, STENT PLACEMENT      Signed by Jacalyn Lefevre, M.D. on 07/30/19 at 1:26 PM (EDT)

## 2019-08-02 NOTE — Progress Notes (Signed)
PCP - Pleas Koch, NP Cardiologist -   Chest x-ray - CT CHEST 06-2019 Epic  EKG -  Stress Test -  ECHO -  Cardiac Cath -   Sleep Study -  CPAP -   Fasting Blood Sugar -  Checks Blood Sugar _____ times a day  Blood Thinner Instructions: Aspirin Instructions: Last Dose:  Anesthesia review:   Patient denies shortness of breath, fever, cough and chest pain at PAT appointment   Patient verbalized understanding of instructions that were given to them at the PAT appointment. Patient was also instructed that they will need to review over the PAT instructions again at home before surgery.   CBCDIFF, BMP, UA  08-01-2019 Epic

## 2019-08-03 ENCOUNTER — Ambulatory Visit (HOSPITAL_COMMUNITY): Payer: Medicare Other

## 2019-08-03 ENCOUNTER — Ambulatory Visit (HOSPITAL_COMMUNITY)
Admission: RE | Admit: 2019-08-03 | Discharge: 2019-08-03 | Disposition: A | Discharge status: Home / Self Care | Payer: Medicare Other | Attending: Urology | Admitting: Urology

## 2019-08-03 ENCOUNTER — Ambulatory Visit (HOSPITAL_COMMUNITY): Payer: Medicare Other | Admitting: Anesthesiology

## 2019-08-03 ENCOUNTER — Ambulatory Visit (HOSPITAL_COMMUNITY): Payer: Medicare Other | Admitting: Physician Assistant

## 2019-08-03 ENCOUNTER — Encounter (HOSPITAL_COMMUNITY)
Admission: RE | Disposition: A | Discharge status: Home / Self Care | Payer: Self-pay | Source: Home / Self Care | Attending: Urology

## 2019-08-03 ENCOUNTER — Encounter (HOSPITAL_COMMUNITY): Payer: Self-pay | Admitting: *Deleted

## 2019-08-03 DIAGNOSIS — M1990 Unspecified osteoarthritis, unspecified site: Secondary | ICD-10-CM | POA: Insufficient documentation

## 2019-08-03 DIAGNOSIS — E785 Hyperlipidemia, unspecified: Secondary | ICD-10-CM | POA: Insufficient documentation

## 2019-08-03 DIAGNOSIS — Z79899 Other long term (current) drug therapy: Secondary | ICD-10-CM | POA: Diagnosis not present

## 2019-08-03 DIAGNOSIS — N202 Calculus of kidney with calculus of ureter: Secondary | ICD-10-CM | POA: Diagnosis not present

## 2019-08-03 DIAGNOSIS — J449 Chronic obstructive pulmonary disease, unspecified: Secondary | ICD-10-CM | POA: Diagnosis not present

## 2019-08-03 DIAGNOSIS — K219 Gastro-esophageal reflux disease without esophagitis: Secondary | ICD-10-CM | POA: Diagnosis not present

## 2019-08-03 DIAGNOSIS — F17200 Nicotine dependence, unspecified, uncomplicated: Secondary | ICD-10-CM | POA: Insufficient documentation

## 2019-08-03 HISTORY — PX: CYSTOSCOPY/URETEROSCOPY/HOLMIUM LASER/STENT PLACEMENT: SHX6546

## 2019-08-03 SURGERY — CYSTOSCOPY/URETEROSCOPY/HOLMIUM LASER/STENT PLACEMENT
Anesthesia: General | Laterality: Right

## 2019-08-03 MED ORDER — ONDANSETRON HCL 4 MG/2ML IJ SOLN
INTRAMUSCULAR | Status: AC
  Filled 2019-08-03: qty 2

## 2019-08-03 MED ORDER — LIDOCAINE HCL (CARDIAC) PF 100 MG/5ML IV SOSY
PREFILLED_SYRINGE | INTRAVENOUS | Status: DC | PRN
  Administered 2019-08-03: 60 mg via INTRAVENOUS

## 2019-08-03 MED ORDER — CEFAZOLIN SODIUM-DEXTROSE 2-4 GM/100ML-% IV SOLN
2.0000 g | INTRAVENOUS | Status: AC
  Administered 2019-08-03: 2 g via INTRAVENOUS

## 2019-08-03 MED ORDER — PROPOFOL 10 MG/ML IV BOLUS
INTRAVENOUS | Status: AC
  Filled 2019-08-03: qty 20

## 2019-08-03 MED ORDER — DEXAMETHASONE SODIUM PHOSPHATE 10 MG/ML IJ SOLN
INTRAMUSCULAR | Status: DC | PRN
  Administered 2019-08-03: 8 mg via INTRAVENOUS

## 2019-08-03 MED ORDER — PROPOFOL 10 MG/ML IV BOLUS
INTRAVENOUS | Status: DC | PRN
  Administered 2019-08-03: 150 mg via INTRAVENOUS

## 2019-08-03 MED ORDER — FENTANYL CITRATE (PF) 100 MCG/2ML IJ SOLN
INTRAMUSCULAR | Status: DC | PRN
  Administered 2019-08-03: 50 ug via INTRAVENOUS

## 2019-08-03 MED ORDER — PHENYLEPHRINE 40 MCG/ML (10ML) SYRINGE FOR IV PUSH (FOR BLOOD PRESSURE SUPPORT)
PREFILLED_SYRINGE | INTRAVENOUS | Status: AC
  Filled 2019-08-03: qty 10

## 2019-08-03 MED ORDER — OXYBUTYNIN CHLORIDE ER 10 MG PO TB24: 10.0000 mg | ORAL_TABLET | Freq: Every day | ORAL | 0 refills | Status: DC

## 2019-08-03 MED ORDER — FENTANYL CITRATE (PF) 100 MCG/2ML IJ SOLN
INTRAMUSCULAR | Status: AC
  Filled 2019-08-03: qty 2

## 2019-08-03 MED ORDER — HYDROCODONE-ACETAMINOPHEN 5-325 MG PO TABS: 1.0000 | ORAL_TABLET | ORAL | 0 refills | Status: AC | PRN

## 2019-08-03 MED ORDER — LACTATED RINGERS IV SOLN
INTRAVENOUS | Status: DC
  Administered 2019-08-03: 06:00:00 via INTRAVENOUS

## 2019-08-03 MED ORDER — EPHEDRINE 5 MG/ML INJ
INTRAVENOUS | Status: AC
  Filled 2019-08-03: qty 10

## 2019-08-03 MED ORDER — PHENYLEPHRINE 40 MCG/ML (10ML) SYRINGE FOR IV PUSH (FOR BLOOD PRESSURE SUPPORT)
PREFILLED_SYRINGE | INTRAVENOUS | Status: DC | PRN
  Administered 2019-08-03 (×2): 120 ug via INTRAVENOUS
  Administered 2019-08-03: 160 ug via INTRAVENOUS

## 2019-08-03 MED ORDER — FENTANYL CITRATE (PF) 100 MCG/2ML IJ SOLN: 25.0000 ug | INTRAMUSCULAR | Status: DC | PRN

## 2019-08-03 MED ORDER — ONDANSETRON HCL 4 MG/2ML IJ SOLN
INTRAMUSCULAR | Status: DC | PRN
  Administered 2019-08-03: 4 mg via INTRAVENOUS

## 2019-08-03 MED ORDER — SODIUM CHLORIDE 0.9 % IR SOLN
Status: DC | PRN
  Administered 2019-08-03: 3000 mL

## 2019-08-03 MED ORDER — CEFAZOLIN SODIUM-DEXTROSE 2-4 GM/100ML-% IV SOLN
INTRAVENOUS | Status: AC
  Filled 2019-08-03: qty 100

## 2019-08-03 MED ORDER — ACETAMINOPHEN 500 MG PO TABS
1000.0000 mg | ORAL_TABLET | Freq: Once | ORAL | Status: AC
  Administered 2019-08-03: 07:00:00 1000 mg via ORAL
  Filled 2019-08-03: qty 2

## 2019-08-03 MED ORDER — EPHEDRINE SULFATE-NACL 50-0.9 MG/10ML-% IV SOSY
PREFILLED_SYRINGE | INTRAVENOUS | Status: DC | PRN
  Administered 2019-08-03: 15 mg via INTRAVENOUS

## 2019-08-03 SURGICAL SUPPLY — 22 items
BAG URO CATCHER STRL LF (MISCELLANEOUS) ×3 IMPLANT
BASKET ZERO TIP NITINOL 2.4FR (BASKET) IMPLANT
BSKT STON RTRVL ZERO TP 2.4FR (BASKET)
CATH URET 5FR 28IN OPEN ENDED (CATHETERS) ×3 IMPLANT
CLOTH BEACON ORANGE TIMEOUT ST (SAFETY) ×3 IMPLANT
DRSG TEGADERM 4X4.75 (GAUZE/BANDAGES/DRESSINGS) ×1 IMPLANT
EXTRACTOR STONE 1.7FRX115CM (UROLOGICAL SUPPLIES) IMPLANT
GLOVE BIO SURGEON STRL SZ 6.5 (GLOVE) ×2 IMPLANT
GLOVE BIO SURGEONS STRL SZ 6.5 (GLOVE) ×1
GOWN STRL REUS W/TWL XL LVL3 (GOWN DISPOSABLE) ×1 IMPLANT
GUIDEWIRE ANG ZIPWIRE 038X150 (WIRE) ×2 IMPLANT
GUIDEWIRE STR DUAL SENSOR (WIRE) ×5 IMPLANT
KIT TURNOVER KIT A (KITS) IMPLANT
MANIFOLD NEPTUNE II (INSTRUMENTS) ×3 IMPLANT
PACK CYSTO (CUSTOM PROCEDURE TRAY) ×3 IMPLANT
SHEATH URETERAL 12FRX28CM (UROLOGICAL SUPPLIES) ×2 IMPLANT
SHEATH URETERAL 12FRX35CM (MISCELLANEOUS) IMPLANT
STENT URET 6FRX24 CONTOUR (STENTS) ×2 IMPLANT
TUBING CONNECTING 10 (TUBING) ×2 IMPLANT
TUBING CONNECTING 10' (TUBING) ×1
TUBING UROLOGY SET (TUBING) ×3 IMPLANT
WIRE COONS/BENSON .038X145CM (WIRE) IMPLANT

## 2019-08-03 NOTE — Op Note (Addendum)
Preoperative diagnosis: Right UPJ calculus and right renal calculus  Postoperative diagnosis: Same  Procedure performed: cystoscopy, right semi-rigid ureteroscopy, right ureteral stent placement  Antibiotics: 2 g of Ancef  Tubes and drains: 6 French by 24 cm right double-J ureteral stent  Findings: 1.  Normal urethra 2.  Bladder mucosa normal without masses or abnormalities. 3.  Bilateral ureteral orifices with clear effluxing urine 4.  Narrow distal right ureter - unable to traverse ureteroscope  Indication for procedure: 68 year old woman found to have a right UPJ calculus and right lower pole renal calculi presents for surgery.  Procedure in detail.  After the risks and benefits of the procedure were discussed with the patient and informed consent was obtained.  The patient was taken to the operating room placed in the supine position and anesthesia was induced.  She was then repositioned in the dorsolithotomy position and she was prepped and draped in usual sterile fashion.  A timeout was performed.  A 21 French rigid cystoscope was placed in the rectum anus advanced in the bladder and direct visualization.  Cystoscopy took place and bladder mucosa was within normal limits.  The right ureteral orifice was identified and cannulated with a 0.35 sensor wire.  A semirigid ureteroscope was then advanced alongside the wire however the distal ureter was very narrow.  A second wire was placed the ureteroscope and the ureter scope was removed.  Attempts at placing a ureteral access sheath were unsuccessful.  The decision was made to place a ureteral stent and come back later for staged surgery.  A 6 French by 24 cm double-J ureteral stent was placed with cystoscopic and fluoroscopic guidance.  A proximal curl was confirmed by fluoroscopy and distal curl seen under direct visualization.  The patient's bladder was decompressed and the cystoscope was removed.  The patient emerged from anesthesia  without complication and was transferred the PACU in stable condition.  Disposition following procedure: Stable  Follow-up: The patient rescheduled for second surgery to remove the stone.

## 2019-08-03 NOTE — Interval H&P Note (Signed)
History and Physical Interval Note:  08/03/2019 7:31 AM  Jessica [name]  has presented today for surgery, with the diagnosis of RIGHT URETERAL AND RENAL CALCULI.  The various methods of treatment have been discussed with the patient and family. After consideration of risks, benefits and other options for treatment, the patient has consented to  Procedure(s) with comments: CYSTOSCOPY/URETEROSCOPY/HOLMIUM LASER/STENT PLACEMENT (Right) - 90 MINS as a surgical intervention.  The patient's history has been reviewed, patient examined, no change in status, stable for surgery.  I have reviewed the patient's chart and labs.  Questions were answered to the patient's satisfaction.     Torrey Horseman D Dashanti Burr

## 2019-08-03 NOTE — Anesthesia Postprocedure Evaluation (Signed)
Anesthesia Post Note  Patient: Jessica [name]  Procedure(s) Performed: CYSTOSCOPY/URETEROSCOPY/STENT PLACEMENT (Right )     Patient location during evaluation: PACU Anesthesia Type: General Level of consciousness: awake and alert Pain management: pain level controlled Vital Signs Assessment: post-procedure vital signs reviewed and stable Respiratory status: spontaneous breathing, nonlabored ventilation, respiratory function stable and patient connected to nasal cannula oxygen Cardiovascular status: blood pressure returned to baseline and stable Postop Assessment: no apparent nausea or vomiting Anesthetic complications: no    Last Vitals:  Vitals:   08/03/19 0930 08/03/19 0940  BP: (!) 96/56 111/60  Pulse: 82 74  Resp: (!) 21 18  Temp:  36.7 C  SpO2: 100% 96%    Last Pain:  Vitals:   08/03/19 0940  TempSrc:   PainSc: 0-No pain                 Chelsey L Woodrum

## 2019-08-03 NOTE — Transfer of Care (Signed)
Immediate Anesthesia Transfer of Care Note  Patient: Jessica [name]  Procedure(s) Performed: CYSTOSCOPY/URETEROSCOPY/STENT PLACEMENT (Right )  Patient Location: PACU  Anesthesia Type:General  Level of Consciousness: drowsy, patient cooperative and responds to stimulation  Airway & Oxygen Therapy: Patient Spontanous Breathing and Patient connected to face mask oxygen  Post-op Assessment: Report given to RN and Post -op Vital signs reviewed and stable  Post vital signs: Reviewed and stable  Last Vitals:  Vitals Value Taken Time  BP 114/64 08/03/19 0845  Temp    Pulse 83 08/03/19 0846  Resp 10 08/03/19 0846  SpO2 100 % 08/03/19 0846  Vitals shown include unvalidated device data.  Last Pain:  Vitals:   08/03/19 0624  TempSrc: Oral         Complications: No apparent anesthesia complications

## 2019-08-03 NOTE — Anesthesia Procedure Notes (Signed)
Procedure Name: LMA Insertion Date/Time: 08/03/2019 8:14 AM Performed by: Glory Buff, CRNA Pre-anesthesia Checklist: Patient identified, Emergency Drugs available, Suction available and Patient being monitored Patient Re-evaluated:Patient Re-evaluated prior to induction Oxygen Delivery Method: Circle system utilized Preoxygenation: Pre-oxygenation with 100% oxygen Induction Type: IV induction LMA: LMA inserted LMA Size: 4.0 Number of attempts: 1 Placement Confirmation: positive ETCO2 Tube secured with: Tape Dental Injury: Teeth and Oropharynx as per pre-operative assessment

## 2019-08-03 NOTE — Anesthesia Preprocedure Evaluation (Addendum)
Anesthesia Evaluation  Patient identified by MRN, date of birth, ID band Patient awake    Reviewed: Allergy & Precautions, NPO status , Patient's Chart, lab work & pertinent test results  Airway Mallampati: II  TM Distance: >3 FB Neck ROM: Full    Dental  (+) Missing, Poor Dentition, Dental Advisory Given,    Pulmonary COPD, Current SmokerPatient did not abstain from smoking.,    Pulmonary exam normal breath sounds clear to auscultation       Cardiovascular Normal cardiovascular exam Rhythm:Regular Rate:Normal  HLD   Neuro/Psych PSYCHIATRIC DISORDERS Depression negative neurological ROS     GI/Hepatic Neg liver ROS, GERD  Controlled,  Endo/Other  negative endocrine ROS  Renal/GU negative Renal ROS  negative genitourinary   Musculoskeletal  (+) Arthritis ,   Abdominal   Peds  Hematology negative hematology ROS (+)   Anesthesia Other Findings Right ureteral and renal stone  Reproductive/Obstetrics                            Anesthesia Physical Anesthesia Plan  ASA: II  Anesthesia Plan: General   Post-op Pain Management:    Induction: Intravenous  PONV Risk Score and Plan: 2 and Ondansetron and Dexamethasone  Airway Management Planned: LMA  Additional Equipment:   Intra-op Plan:   Post-operative Plan: Extubation in OR  Informed Consent: I have reviewed the patients History and Physical, chart, labs and discussed the procedure including the risks, benefits and alternatives for the proposed anesthesia with the patient or authorized representative who has indicated his/her understanding and acceptance.     Dental advisory given  Plan Discussed with: CRNA  Anesthesia Plan Comments:         Anesthesia Quick Evaluation

## 2019-08-03 NOTE — Discharge Instructions (Signed)
Surgery: Cystoscopy with right ureteral stent placement  Instructions: 1.  You will be scheduled for second surgery in approximately 2 weeks to remove the kidney stone. 2.  The stent may cause pressure in your kidney when urinating.  The stent may also cause bladder spasms/cramping.  It is normal to see blood in your urine while the stent is in place.  Drink plenty of fluids. 3.  Continue your antibiotic until it is finished.  The tamsulosin will help with stent discomfort.  You may also take the hydrocodone-acetaminophen to help with severe pain as needed. 4.  You will be contacted by [facility] to schedule your neck surgery.  Medications: 1.  Continue the Keflex until the antibiotics have been completed.  2.  Oxybutynin XL take 1 tab daily for bladder cramping/spasms while the stent is in place 3.  Continue tamusloin daily to help with stent discomfort   Ureteral Stent Implantation, Care After This sheet gives you information about how to care for yourself after your procedure. Your health care provider may also give you more specific instructions. If you have problems or questions, contact your health care provider. What can I expect after the procedure? After the procedure, it is common to have:  Nausea.  Mild pain when you urinate. You may feel this pain in your lower back or lower abdomen. The pain should stop within a few minutes after you urinate. This may last for up to 1 week.  A small amount of blood in your urine for several days. Follow these instructions at home: Medicines  Take over-the-counter and prescription medicines only as told by your health care provider.  If you were prescribed an antibiotic medicine, take it as told by your health care provider. Do not stop taking the antibiotic even if you start to feel better.  Do not drive for 24 hours if you were given a sedative during your procedure.  Ask your health care provider if the medicine prescribed to you  requires you to avoid driving or using heavy machinery. Activity  Rest as told by your health care provider.  Avoid sitting for a long time without moving. Get up to take short walks every 1-2 hours. This is important to improve blood flow and breathing. Ask for help if you feel weak or unsteady.  Return to your normal activities as told by your health care provider. Ask your health care provider what activities are safe for you. General instructions   Watch for any blood in your urine. Call your health care provider if the amount of blood in your urine increases.  If you have a catheter: ? Follow instructions from your health care provider about taking care of your catheter and collection bag. ? Do not take baths, swim, or use a hot tub until your health care provider approves. Ask your health care provider if you may take showers. You may only be allowed to take sponge baths.  Drink enough fluid to keep your urine pale yellow.  Do not use any products that contain nicotine or tobacco, such as cigarettes, e-cigarettes, and chewing tobacco. These can delay healing after surgery. If you need help quitting, ask your health care provider.  Keep all follow-up visits as told by your health care provider. This is important. Contact a health care provider if:  You have pain that gets worse or does not get better with medicine, especially pain when you urinate.  You have difficulty urinating.  You feel nauseous or you  vomit repeatedly during a period of more than 2 days after the procedure. Get help right away if:  Your urine is dark red or has blood clots in it.  You are leaking urine (have incontinence).  The end of the stent comes out of your urethra.  You cannot urinate.  You have sudden, sharp, or severe pain in your abdomen or lower back.  You have a fever.  You have swelling or pain in your legs.  You have difficulty breathing. Summary  After the procedure, it is common  to have mild pain when you urinate that goes away within a few minutes after you urinate. This may last for up to 1 week.  Watch for any blood in your urine. Call your health care provider if the amount of blood in your urine increases.  Take over-the-counter and prescription medicines only as told by your health care provider.  Drink enough fluid to keep your urine pale yellow. This information is not intended to replace advice given to you by your health care provider. Make sure you discuss any questions you have with your health care provider. Document Released: 05/19/2013 Document Revised: 06/23/2018 Document Reviewed: 06/24/2018 Elsevier Patient Education  2020 [employer].

## 2019-08-04 ENCOUNTER — Encounter (HOSPITAL_COMMUNITY): Payer: Self-pay | Admitting: Urology

## 2019-08-05 ENCOUNTER — Other Ambulatory Visit: Payer: Self-pay | Admitting: Urology

## 2019-08-13 ENCOUNTER — Encounter (HOSPITAL_BASED_OUTPATIENT_CLINIC_OR_DEPARTMENT_OTHER): Payer: Self-pay | Admitting: *Deleted

## 2019-08-13 ENCOUNTER — Other Ambulatory Visit (HOSPITAL_COMMUNITY)
Admission: RE | Admit: 2019-08-13 | Discharge: 2019-08-13 | Disposition: A | Discharge status: Home / Self Care | Payer: Medicare Other | Source: Ambulatory Visit | Attending: Urology | Admitting: Urology

## 2019-08-13 ENCOUNTER — Other Ambulatory Visit: Payer: Self-pay

## 2019-08-13 DIAGNOSIS — Z01812 Encounter for preprocedural laboratory examination: Secondary | ICD-10-CM | POA: Diagnosis not present

## 2019-08-13 DIAGNOSIS — Z20828 Contact with and (suspected) exposure to other viral communicable diseases: Secondary | ICD-10-CM | POA: Diagnosis not present

## 2019-08-13 NOTE — Progress Notes (Signed)
Spoke w/ via phone for pre-op interview---Jessica [name] needs dos---- none             [name] results------ COVID test ------08-13-2019 Arrive at -------700 am-08-17-2019 NPO after ------midnight Medications to take morning of surgery -----hydrocodone prn Diabetic medication -----n/a Patient Special Instructions ----- Pre-Op special Istructions ----- Patient verbalized understanding of instructions that were given at this phone interview. Patient denies shortness of breath, chest pain, fever, cough a this phone interview.

## 2019-08-15 LAB — NOVEL CORONAVIRUS, NAA (HOSP ORDER, SEND-OUT TO REF LAB; TAT 18-24 HRS): SARS-CoV-2, NAA: NOT DETECTED

## 2019-08-16 NOTE — Anesthesia Preprocedure Evaluation (Signed)
Anesthesia Evaluation  Patient identified by MRN, date of birth, ID band Patient awake    Reviewed: Allergy & Precautions, NPO status , Patient's Chart, lab work & pertinent test results  Airway Mallampati: II  TM Distance: >3 FB Neck ROM: Full    Dental  (+) Missing, Poor Dentition, Dental Advisory Given,    Pulmonary COPD, Current SmokerPatient did not abstain from smoking.,    Pulmonary exam normal breath sounds clear to auscultation       Cardiovascular Normal cardiovascular exam Rhythm:Regular Rate:Normal  HLD   Neuro/Psych PSYCHIATRIC DISORDERS Depression negative neurological ROS     GI/Hepatic Neg liver ROS, GERD  Controlled,  Endo/Other  negative endocrine ROS  Renal/GU Renal disease  negative genitourinary   Musculoskeletal  (+) Arthritis ,   Abdominal   Peds  Hematology negative hematology ROS (+)   Anesthesia Other Findings Right ureteral and renal stone  Reproductive/Obstetrics                             Anesthesia Physical  Anesthesia Plan  ASA: III  Anesthesia Plan: General   Post-op Pain Management:    Induction: Intravenous  PONV Risk Score and Plan: 2 and Ondansetron and Dexamethasone  Airway Management Planned: LMA  Additional Equipment:   Intra-op Plan:   Post-operative Plan: Extubation in OR  Informed Consent: I have reviewed the patients History and Physical, chart, labs and discussed the procedure including the risks, benefits and alternatives for the proposed anesthesia with the patient or authorized representative who has indicated his/her understanding and acceptance.     Dental advisory given  Plan Discussed with: CRNA  Anesthesia Plan Comments:         Anesthesia Quick Evaluation

## 2019-08-17 ENCOUNTER — Ambulatory Visit (HOSPITAL_BASED_OUTPATIENT_CLINIC_OR_DEPARTMENT_OTHER): Payer: Medicare Other | Admitting: Anesthesiology

## 2019-08-17 ENCOUNTER — Ambulatory Visit (HOSPITAL_BASED_OUTPATIENT_CLINIC_OR_DEPARTMENT_OTHER)
Admission: RE | Admit: 2019-08-17 | Discharge: 2019-08-17 | Disposition: A | Discharge status: Home / Self Care | Payer: Medicare Other | Attending: Urology | Admitting: Urology

## 2019-08-17 ENCOUNTER — Encounter (HOSPITAL_BASED_OUTPATIENT_CLINIC_OR_DEPARTMENT_OTHER): Payer: Self-pay | Admitting: *Deleted

## 2019-08-17 ENCOUNTER — Encounter (HOSPITAL_BASED_OUTPATIENT_CLINIC_OR_DEPARTMENT_OTHER)
Admission: RE | Disposition: A | Discharge status: Home / Self Care | Payer: Self-pay | Source: Home / Self Care | Attending: Urology

## 2019-08-17 ENCOUNTER — Other Ambulatory Visit: Payer: Self-pay

## 2019-08-17 DIAGNOSIS — F17200 Nicotine dependence, unspecified, uncomplicated: Secondary | ICD-10-CM | POA: Insufficient documentation

## 2019-08-17 DIAGNOSIS — M1990 Unspecified osteoarthritis, unspecified site: Secondary | ICD-10-CM | POA: Diagnosis not present

## 2019-08-17 DIAGNOSIS — N202 Calculus of kidney with calculus of ureter: Secondary | ICD-10-CM | POA: Diagnosis not present

## 2019-08-17 DIAGNOSIS — J449 Chronic obstructive pulmonary disease, unspecified: Secondary | ICD-10-CM | POA: Diagnosis not present

## 2019-08-17 DIAGNOSIS — E785 Hyperlipidemia, unspecified: Secondary | ICD-10-CM | POA: Diagnosis not present

## 2019-08-17 DIAGNOSIS — Z79899 Other long term (current) drug therapy: Secondary | ICD-10-CM | POA: Diagnosis not present

## 2019-08-17 DIAGNOSIS — F329 Major depressive disorder, single episode, unspecified: Secondary | ICD-10-CM | POA: Insufficient documentation

## 2019-08-17 DIAGNOSIS — K219 Gastro-esophageal reflux disease without esophagitis: Secondary | ICD-10-CM | POA: Diagnosis not present

## 2019-08-17 HISTORY — PX: CYSTOSCOPY/URETEROSCOPY/HOLMIUM LASER/STENT PLACEMENT: SHX6546

## 2019-08-17 HISTORY — DX: Personal history of urinary calculi: Z87.442

## 2019-08-17 SURGERY — CYSTOSCOPY/URETEROSCOPY/HOLMIUM LASER/STENT PLACEMENT
Anesthesia: General | Site: Ureter | Laterality: Right

## 2019-08-17 MED ORDER — FENTANYL CITRATE (PF) 100 MCG/2ML IJ SOLN
25.0000 ug | INTRAMUSCULAR | Status: DC | PRN
  Administered 2019-08-17: 50 ug via INTRAVENOUS
  Filled 2019-08-17: qty 1

## 2019-08-17 MED ORDER — FENTANYL CITRATE (PF) 100 MCG/2ML IJ SOLN
INTRAMUSCULAR | Status: AC
  Filled 2019-08-17: qty 2

## 2019-08-17 MED ORDER — OXYBUTYNIN CHLORIDE ER 10 MG PO TB24
ORAL_TABLET | ORAL | Status: AC
  Filled 2019-08-17: qty 1

## 2019-08-17 MED ORDER — ONDANSETRON HCL 4 MG/2ML IJ SOLN
INTRAMUSCULAR | Status: AC
  Filled 2019-08-17: qty 2

## 2019-08-17 MED ORDER — ONDANSETRON HCL 4 MG/2ML IJ SOLN
4.0000 mg | Freq: Once | INTRAMUSCULAR | Status: DC | PRN
  Filled 2019-08-17: qty 2

## 2019-08-17 MED ORDER — EPHEDRINE SULFATE-NACL 50-0.9 MG/10ML-% IV SOSY
PREFILLED_SYRINGE | INTRAVENOUS | Status: DC | PRN
  Administered 2019-08-17: 5 mg via INTRAVENOUS
  Administered 2019-08-17 (×2): 15 mg via INTRAVENOUS
  Administered 2019-08-17: 5 mg via INTRAVENOUS

## 2019-08-17 MED ORDER — MEPERIDINE HCL 25 MG/ML IJ SOLN
6.2500 mg | INTRAMUSCULAR | Status: DC | PRN
  Filled 2019-08-17: qty 1

## 2019-08-17 MED ORDER — PROPOFOL 10 MG/ML IV BOLUS
INTRAVENOUS | Status: DC | PRN
  Administered 2019-08-17: 20 mg via INTRAVENOUS
  Administered 2019-08-17: 150 mg via INTRAVENOUS

## 2019-08-17 MED ORDER — OXYCODONE HCL 5 MG PO TABS
5.0000 mg | ORAL_TABLET | Freq: Once | ORAL | Status: AC
  Administered 2019-08-17: 5 mg via ORAL
  Filled 2019-08-17: qty 1

## 2019-08-17 MED ORDER — DEXAMETHASONE SODIUM PHOSPHATE 10 MG/ML IJ SOLN
INTRAMUSCULAR | Status: AC
  Filled 2019-08-17: qty 1

## 2019-08-17 MED ORDER — SODIUM CHLORIDE 0.9 % IR SOLN
Status: DC | PRN
  Administered 2019-08-17 (×2): 3000 mL

## 2019-08-17 MED ORDER — EPHEDRINE 5 MG/ML INJ
INTRAVENOUS | Status: AC
  Filled 2019-08-17: qty 10

## 2019-08-17 MED ORDER — OXYCODONE HCL 5 MG PO TABS
ORAL_TABLET | ORAL | Status: AC
  Filled 2019-08-17: qty 1

## 2019-08-17 MED ORDER — CEFAZOLIN SODIUM-DEXTROSE 2-4 GM/100ML-% IV SOLN
2.0000 g | INTRAVENOUS | Status: AC
  Administered 2019-08-17: 2 g via INTRAVENOUS
  Filled 2019-08-17: qty 100

## 2019-08-17 MED ORDER — CEFAZOLIN SODIUM-DEXTROSE 2-4 GM/100ML-% IV SOLN
INTRAVENOUS | Status: AC
  Filled 2019-08-17: qty 100

## 2019-08-17 MED ORDER — ONDANSETRON HCL 4 MG/2ML IJ SOLN
INTRAMUSCULAR | Status: DC | PRN
  Administered 2019-08-17 (×2): 4 mg via INTRAVENOUS

## 2019-08-17 MED ORDER — LACTATED RINGERS IV SOLN
INTRAVENOUS | Status: DC
  Administered 2019-08-17 (×2): via INTRAVENOUS
  Filled 2019-08-17: qty 1000

## 2019-08-17 MED ORDER — OXYBUTYNIN CHLORIDE ER 10 MG PO TB24
10.0000 mg | ORAL_TABLET | Freq: Once | ORAL | Status: AC
  Administered 2019-08-17: 12:00:00 10 mg via ORAL
  Filled 2019-08-17: qty 1

## 2019-08-17 MED ORDER — FENTANYL CITRATE (PF) 100 MCG/2ML IJ SOLN
INTRAMUSCULAR | Status: DC | PRN
  Administered 2019-08-17 (×3): 25 ug via INTRAVENOUS
  Administered 2019-08-17: 50 ug via INTRAVENOUS
  Administered 2019-08-17 (×3): 25 ug via INTRAVENOUS

## 2019-08-17 MED ORDER — PROPOFOL 10 MG/ML IV BOLUS
INTRAVENOUS | Status: AC
  Filled 2019-08-17: qty 40

## 2019-08-17 MED ORDER — DEXAMETHASONE SODIUM PHOSPHATE 10 MG/ML IJ SOLN
INTRAMUSCULAR | Status: DC | PRN
  Administered 2019-08-17: 8 mg via INTRAVENOUS

## 2019-08-17 MED ORDER — LIDOCAINE 2% (20 MG/ML) 5 ML SYRINGE
INTRAMUSCULAR | Status: AC
  Filled 2019-08-17: qty 5

## 2019-08-17 MED ORDER — LIDOCAINE 2% (20 MG/ML) 5 ML SYRINGE
INTRAMUSCULAR | Status: DC | PRN
  Administered 2019-08-17: 50 mg via INTRAVENOUS

## 2019-08-17 MED ORDER — ARTIFICIAL TEARS OPHTHALMIC OINT
TOPICAL_OINTMENT | OPHTHALMIC | Status: AC
  Filled 2019-08-17: qty 3.5

## 2019-08-17 SURGICAL SUPPLY — 22 items
BAG DRAIN CYSTO-URO STER (DRAIN) ×2 IMPLANT
BASKET ZERO TIP NITINOL 2.4FR (BASKET) ×2 IMPLANT
BSKT STON RTRVL ZERO TP 2.4FR (BASKET) ×1
CATH URET 5FR 28IN OPEN ENDED (CATHETERS) ×1 IMPLANT
CLOTH BEACON ORANGE TIMEOUT ST (SAFETY) ×3 IMPLANT
DRSG TEGADERM 4X4.75 (GAUZE/BANDAGES/DRESSINGS) ×1 IMPLANT
FIBER LASER TRAC TIP (UROLOGICAL SUPPLIES) ×2 IMPLANT
GLOVE BIO SURGEON STRL SZ 6.5 (GLOVE) ×2 IMPLANT
GLOVE BIO SURGEONS STRL SZ 6.5 (GLOVE) ×1
GLOVE BIOGEL PI IND STRL 7.0 (GLOVE) IMPLANT
GLOVE BIOGEL PI INDICATOR 7.0 (GLOVE) ×4
GOWN STRL REUS W/ TWL LRG LVL3 (GOWN DISPOSABLE) IMPLANT
GOWN STRL REUS W/TWL LRG LVL3 (GOWN DISPOSABLE) ×6
GUIDEWIRE STR DUAL SENSOR (WIRE) ×5 IMPLANT
KIT TURNOVER CYSTO (KITS) ×2 IMPLANT
MANIFOLD NEPTUNE II (INSTRUMENTS) ×3 IMPLANT
PACK CYSTO (CUSTOM PROCEDURE TRAY) ×3 IMPLANT
SHEATH URETERAL 12FRX28CM (UROLOGICAL SUPPLIES) ×2 IMPLANT
STENT URET 6FRX24 CONTOUR (STENTS) ×2 IMPLANT
TUBE CONNECTING 12'X1/4 (SUCTIONS) ×1
TUBE CONNECTING 12X1/4 (SUCTIONS) ×2 IMPLANT
TUBING UROLOGY SET (TUBING) ×3 IMPLANT

## 2019-08-17 NOTE — Anesthesia Procedure Notes (Signed)
Procedure Name: LMA Insertion Date/Time: 08/17/2019 8:38 AM Performed by: Mechele Claude, CRNA Pre-anesthesia Checklist: Patient identified, Emergency Drugs available, Suction available and Patient being monitored Patient Re-evaluated:Patient Re-evaluated prior to induction Oxygen Delivery Method: Circle system utilized Preoxygenation: Pre-oxygenation with 100% oxygen Induction Type: IV induction Ventilation: Mask ventilation without difficulty LMA: LMA inserted LMA Size: 4.0 Number of attempts: 1 Airway Equipment and Method: Bite block Placement Confirmation: positive ETCO2 Tube secured with: Tape Dental Injury: Teeth and Oropharynx as per pre-operative assessment

## 2019-08-17 NOTE — Op Note (Signed)
PATIENT:  Jessica [name]  PRE-OPERATIVE DIAGNOSIS:  Right UPJ calculus and right renal calculus  POST-OPERATIVE DIAGNOSIS: Same  PROCEDURE:  1. Cystoscopy, right ureteroscopy, laser lithotripsy, right ureteral stent exchange  SURGEON: Jacalyn Lefevre, MD  INDICATION: 68 year old woman with 13 mm right UPJ calculus and a 10 mm right lower pole calculus returns for staged procedure.  ANTIBIOTICS: 2 g Ancef  ANESTHESIA:  General  EBL:  Minimal  DRAINS: 6 [place] by 24 cm right JJ stent  SPECIMEN: Right renal calculi  DESCRIPTION OF PROCEDURE: The patient was taken to the major OR and placed on the table. General anesthesia was administered and then the patient was moved to the dorsal lithotomy position. The genitalia was sterilely prepped and draped. An official timeout was performed.  Initially the 73 French cystoscope with 30 lens was passed under direct vision into the bladder.   The bladder was then fully inspected. It was noted be free of any tumors, stones or inflammatory lesions. Ureteral orifices were of normal configuration and position.  The existing right ureteral stent was grasped with a grasper and brought to the urethral meatus.  A sensor wire was then placed through the wire and advanced to the kidney.  Placement confirmed with fluoroscopy.  The stent was then removed and a second wire was placed alongside the first wire and secured as a safety wire.  A ureteral access sheath was then placed over the working wire with fluoroscopic guidance and the inner sheath and wire were removed.   A ureteroscope was then passed through the access sheath up to the right UPJ.  The stone was identified and a 200  holmium laser fiber was used to fragment the stone. I then used the nitinol basket to extract all of the stone fragments and reinspection of the ureter ureteroscopically revealed no further stone fragments and no injury to the ureter.  Attention then turned to the right lower pole  stone which was fragmented in the similar fashion and basket as well.  I then backloaded the cystoscope over the guidewire and passed the stent over the guidewire into the area of the renal pelvis. As the guidewire was removed good curl was noted in the renal pelvis. The bladder was drained and the cystoscope was then removed. The patient tolerated the procedure well no intraoperative complications.  PLAN OF CARE: Discharge to home after PACU  PATIENT DISPOSITION:  PACU - hemodynamically stable.

## 2019-08-17 NOTE — Discharge Instructions (Signed)
Ureteral Stent Implantation, Care After This sheet gives you information about how to care for yourself after your procedure. Your health care provider may also give you more specific instructions. If you have problems or questions, contact your health care provider. What can I expect after the procedure? After the procedure, it is common to have:  Nausea.  Mild pain when you urinate. You may feel this pain in your lower back or lower abdomen. The pain should stop within a few minutes after you urinate. This may last for up to 1 week.  A small amount of blood in your urine for several days. Follow these instructions at home: Medicines  Take over-the-counter and prescription medicines only as told by your health care provider.  If you were prescribed an antibiotic medicine, take it as told by your health care provider. Do not stop taking the antibiotic even if you start to feel better.  Do not drive for 24 hours if you were given a sedative during your procedure.  Ask your health care provider if the medicine prescribed to you requires you to avoid driving or using heavy machinery. Activity  Rest as told by your health care provider.  Avoid sitting for a long time without moving. Get up to take short walks every 1-2 hours. This is important to improve blood flow and breathing. Ask for help if you feel weak or unsteady.  Return to your normal activities as told by your health care provider. Ask your health care provider what activities are safe for you. General instructions   Watch for any blood in your urine. Call your health care provider if the amount of blood in your urine increases.  If you have a catheter: ? Follow instructions from your health care provider about taking care of your catheter and collection bag. ? Do not take baths, swim, or use a hot tub until your health care provider approves. Ask your health care provider if you may take showers. You may only be allowed to  take sponge baths.  Drink enough fluid to keep your urine pale yellow.  Do not use any products that contain nicotine or tobacco, such as cigarettes, e-cigarettes, and chewing tobacco. These can delay healing after surgery. If you need help quitting, ask your health care provider.  Keep all follow-up visits as told by your health care provider. This is important. Contact a health care provider if:  You have pain that gets worse or does not get better with medicine, especially pain when you urinate.  You have difficulty urinating.  You feel nauseous or you vomit repeatedly during a period of more than 2 days after the procedure. Get help right away if:  Your urine is dark red or has blood clots in it.  You are leaking urine (have incontinence).  The end of the stent comes out of your urethra.  You cannot urinate.  You have sudden, sharp, or severe pain in your abdomen or lower back.  You have a fever.  You have swelling or pain in your legs.  You have difficulty breathing. Summary  After the procedure, it is common to have mild pain when you urinate that goes away within a few minutes after you urinate. This may last for up to 1 week.  Watch for any blood in your urine. Call your health care provider if the amount of blood in your urine increases.  Take over-the-counter and prescription medicines only as told by your health care provider.  Drink  enough fluid to keep your urine pale yellow. This information is not intended to replace advice given to you by your health care provider. Make sure you discuss any questions you have with your health care provider. Document Released: 05/19/2013 Document Revised: 06/23/2018 Document Reviewed: 06/24/2018 Elsevier Patient Education  [place] Instructions  Activity: Get plenty of rest for the remainder of the day. A responsible individual must stay with you for 24 hours following the  procedure.  For the next 24 hours, DO NOT: -Drive a car -Paediatric nurse -Drink alcoholic beverages -Take any medication unless instructed by your physician -Make any legal decisions or sign important papers.  Meals: Start with liquid foods such as gelatin or soup. Progress to regular foods as tolerated. Avoid greasy, spicy, heavy foods. If nausea and/or vomiting occur, drink only clear liquids until the nausea and/or vomiting subsides. Call your physician if vomiting continues.  Special Instructions/Symptoms: Your throat may feel dry or sore from the anesthesia or the breathing tube placed in your throat during surgery. If this causes discomfort, gargle with warm salt water. The discomfort should disappear within 24 hours.  If you had a scopolamine patch placed behind your ear for the management of post- operative nausea and/or vomiting:  1. The medication in the patch is effective for 72 hours, after which it should be removed.  Wrap patch in a tissue and discard in the trash. Wash hands thoroughly with soap and water. 2. You may remove the patch earlier than 72 hours if you experience unpleasant side effects which may include dry mouth, dizziness or visual disturbances. 3. Avoid touching the patch. Wash your hands with soap and water after contact with the patch.

## 2019-08-17 NOTE — Interval H&P Note (Signed)
History and Physical Interval Note:  Patient underwent cystoscopy with right ureteral stent placement on on 08/03/19 due to inability to traverse ureteroscope to proximal ureter.  She now returns for staged procedure and definitive stone management.  Risks and benefits of procedure are the same as last surgery including but not limited to bleeding, infection, inability to remove stone, pain, stent pain, damage to surrounding structures, ureteral stricture.  Informed consent was obtained.   08/17/2019 7:44 AM  Jessica [name]  has presented today for surgery, with the diagnosis of RIGHT URETEROPELVIC JUNCTION STONE.  The various methods of treatment have been discussed with the patient and family. After consideration of risks, benefits and other options for treatment, the patient has consented to  Procedure(s) with comments: CYSTOSCOPY/URETEROSCOPY/HOLMIUM LASER/STENT PLACEMENT (Right) - 75 MINS as a surgical intervention.  The patient's history has been reviewed, patient examined, no change in status, stable for surgery.  I have reviewed the patient's chart and labs.  Questions were answered to the patient's satisfaction.     Jessica [name]

## 2019-08-17 NOTE — Transfer of Care (Signed)
Last Vitals:  Vitals Value Taken Time  BP 120/65 08/17/19 1054  Temp    Pulse 99 08/17/19 1059  Resp 22 08/17/19 1059  SpO2 94 % 08/17/19 1059  Vitals shown include unvalidated device data.  Last Pain: There were no vitals filed for this visit.       Immediate Anesthesia Transfer of Care Note  Patient: Jessica [name]  Procedure(s) Performed: Procedure(s) (LRB): CYSTOSCOPY/URETEROSCOPY/HOLMIUM LASER/STENT EXCHANGED (Right)  Patient Location: PACU  Anesthesia Type: General  Level of Consciousness: awake, alert  and oriented  Airway & Oxygen Therapy: Patient Spontanous Breathing and Patient connected to nasal cannula oxygen  Post-op Assessment: Report given to PACU RN and Post -op Vital signs reviewed and stable  Post vital signs: Reviewed and stable  Complications: No apparent anesthesia complications

## 2019-08-17 NOTE — Anesthesia Postprocedure Evaluation (Signed)
Anesthesia Post Note  Patient: Jessica [name]  Procedure(s) Performed: CYSTOSCOPY/URETEROSCOPY/HOLMIUM LASER/STENT EXCHANGED (Right Ureter)     Patient location during evaluation: PACU Anesthesia Type: General Level of consciousness: sedated and patient cooperative Pain management: pain level controlled Vital Signs Assessment: post-procedure vital signs reviewed and stable Respiratory status: spontaneous breathing Cardiovascular status: stable Anesthetic complications: no    Last Vitals:  Vitals:   08/17/19 1200 08/17/19 1245  BP: 138/74   Pulse: 87   Resp: 20   Temp:    SpO2: 90% 92%    Last Pain:  Vitals:   08/17/19 1230  PainSc: [place]

## 2019-08-18 ENCOUNTER — Encounter (HOSPITAL_BASED_OUTPATIENT_CLINIC_OR_DEPARTMENT_OTHER): Payer: Self-pay | Admitting: Urology

## 2019-08-24 LAB — CALCULI, WITH PHOTOGRAPH (CLINICAL LAB)
Uric Acid Calculi: 100 %
Weight Calculi: 246 mg

## 2019-09-10 ENCOUNTER — Encounter: Payer: Self-pay | Admitting: Primary Care

## 2019-09-14 DIAGNOSIS — N202 Calculus of kidney with calculus of ureter: Secondary | ICD-10-CM | POA: Diagnosis not present

## 2019-12-16 ENCOUNTER — Telehealth: Payer: Self-pay

## 2019-12-16 NOTE — Telephone Encounter (Signed)
Noted  

## 2019-12-16 NOTE — Telephone Encounter (Signed)
Pt said for 1 wk having rt elbow pain; pt has been gardening and said this happened last yr and pt was given prednisone. No CP. Pt has no covid symptoms, no travel and no known exposure to + covid. Pt scheduled in office appt with Dr Diona Browner on 12/17/19 at 11:40. UC & ED precautions given and pt voiced understanding.

## 2019-12-17 ENCOUNTER — Ambulatory Visit (INDEPENDENT_AMBULATORY_CARE_PROVIDER_SITE_OTHER): Payer: Medicare Other | Admitting: Family Medicine

## 2019-12-17 ENCOUNTER — Encounter: Payer: Self-pay | Admitting: Family Medicine

## 2019-12-17 ENCOUNTER — Other Ambulatory Visit: Payer: Self-pay

## 2019-12-17 VITALS — BP 100/60 | HR 62 | Temp 98.2°F | Ht 62.0 in | Wt 152.8 lb

## 2019-12-17 DIAGNOSIS — M7711 Lateral epicondylitis, right elbow: Secondary | ICD-10-CM | POA: Diagnosis not present

## 2019-12-17 MED ORDER — PREDNISONE 10 MG PO TABS: ORAL_TABLET | ORAL | 0 refills | Status: DC

## 2019-12-17 NOTE — Progress Notes (Signed)
Chief Complaint  Patient presents with  . Elbow Pain    Right    History of Present Illness: HPI  69 year old female pt of Tawni Millers presents with new onset pain in right elbow x 1 week gradually worsening.  Started after she was doing yard work.  Pain, burning on lateral aspect of elbow. Pain increased with stretching arm out. Mild swelling in ankle.  No known fall.   She has been using tylenol for pain.. helps a little.   She has had similar issue in past. Reviewed OV  From 05/2019.Marland Kitchen resolved with prednisone taper. Uric acid 6.4  cbc nml.  This visit occurred during the SARS-CoV-2 public health emergency.  Safety protocols were in place, including screening questions prior to the visit, additional usage of staff PPE, and extensive cleaning of exam room while observing appropriate contact time as indicated for disinfecting solutions.   COVID 19 screen:  No recent travel or known exposure to COVID19 The patient denies respiratory symptoms of COVID 19 at this time. The importance of social distancing was discussed today.     Review of Systems  Constitutional: Negative for chills and fever.  HENT: Negative for congestion and ear pain.   Eyes: Negative for pain and redness.  Respiratory: Negative for cough and shortness of breath.   Cardiovascular: Negative for chest pain, palpitations and leg swelling.  Gastrointestinal: Negative for abdominal pain, blood in stool, constipation, diarrhea, nausea and vomiting.  Genitourinary: Negative for dysuria.  Musculoskeletal: Negative for falls and myalgias.  Skin: Negative for rash.  Neurological: Negative for dizziness.  Psychiatric/Behavioral: Negative for depression. The patient is not nervous/anxious.       Past Medical History:  Diagnosis Date  . Arthrosis of left acromioclavicular joint 09/16/2014  . COPD (chronic obstructive pulmonary disease) (HCC)    infreq exac, continued tobacco abuse  . Dyslipidemia   . GERD  (gastroesophageal reflux disease)   . History of kidney stones   . Osteoarthritis of knee    R knee  . Seasonal allergies     reports that she has been smoking. She has a 51.00 pack-year smoking history. She has never used smokeless tobacco. She reports current alcohol use. She reports that she does not use drugs.   Current Outpatient Medications:  .  alendronate (FOSAMAX) 70 MG tablet, Take 1 tablet by mouth once weekly with a full glass of water on an empty stomach. Do not lay flat 2 hours after taking., Disp: 12 tablet, Rfl: 3 .  atorvastatin (LIPITOR) 40 MG tablet, Take 1 tablet by mouth at bedtime for cholesterol., Disp: 90 tablet, Rfl: 3 .  buPROPion (WELLBUTRIN SR) 150 MG 12 hr tablet, Take 1 tablet (150 mg total) by mouth 2 (two) times daily., Disp: 60 tablet, Rfl: 1   Observations/Objective: Blood pressure 100/60, pulse 62, temperature 98.2 F (36.8 C), temperature source Temporal, height 5\' 2"  (1.575 m), weight 152 lb 12 oz (69.3 kg), SpO2 96 %.  Physical Exam Constitutional:      General: She is not in acute distress.    Appearance: Normal appearance. She is well-developed. She is not ill-appearing or toxic-appearing.  HENT:     Head: Normocephalic.     Right Ear: Hearing, tympanic membrane, ear canal and external ear normal. Tympanic membrane is not erythematous, retracted or bulging.     Left Ear: Hearing, tympanic membrane, ear canal and external ear normal. Tympanic membrane is not erythematous, retracted or bulging.     Nose:  No mucosal edema or rhinorrhea.     Right Sinus: No maxillary sinus tenderness or frontal sinus tenderness.     Left Sinus: No maxillary sinus tenderness or frontal sinus tenderness.     Mouth/Throat:     Pharynx: Uvula midline.  Eyes:     General: Lids are normal. Lids are everted, no foreign bodies appreciated.     Conjunctiva/sclera: Conjunctivae normal.     Pupils: Pupils are equal, round, and reactive to light.  Neck:     Thyroid: No  thyroid mass or thyromegaly.     Vascular: No carotid bruit.     Trachea: Trachea normal.  Cardiovascular:     Rate and Rhythm: Normal rate and regular rhythm.     Pulses: Normal pulses.     Heart sounds: Normal heart sounds, S1 normal and S2 normal. No murmur. No friction rub. No gallop.   Pulmonary:     Effort: Pulmonary effort is normal. No tachypnea or respiratory distress.     Breath sounds: Normal breath sounds. No decreased breath sounds, wheezing, rhonchi or rales.  Abdominal:     General: Bowel sounds are normal.     Palpations: Abdomen is soft.     Tenderness: There is no abdominal tenderness.  Musculoskeletal:     Right elbow: Decreased range of motion. Tenderness present.     Left elbow: Normal.     Cervical back: Normal range of motion and neck supple.  Skin:    General: Skin is warm and dry.     Findings: No rash.  Neurological:     Mental Status: She is alert.  Psychiatric:        Mood and Affect: Mood is not anxious or depressed.        Speech: Speech normal.        Behavior: Behavior normal. Behavior is cooperative.        Thought Content: Thought content normal.        Judgment: Judgment normal.      Assessment and Plan      Eliezer Lofts, MD

## 2019-12-17 NOTE — Patient Instructions (Addendum)
Apply ice to area.  Can wear brace to support arm.  Complete prednisone taper.  Tennis Elbow  Tennis elbow (lateral epicondylitis) is inflammation of tendons in your outer forearm, near your elbow. Tendons are tissues that connect muscle to bone. When you have tennis elbow, inflammation affects the tendons that you use to bend your wrist and move your hand up. Inflammation occurs in the lower part of the upper arm bone (humerus), where the tendons connect to the bone (lateral epicondyle). Tennis elbow often affects people who play tennis, but anyone may get the condition from repeatedly extending the wrist or turning the forearm. What are the causes? This condition is usually caused by repeatedly extending the wrist, turning the forearm, and using the hands. It can result from sports or work that requires repetitive forearm movements. In some cases, it may be caused by a sudden injury. What increases the risk? You are more likely to develop tennis elbow if you play tennis or another racket sport. You also have a higher risk if you frequently use your hands for work. Besides people who play tennis, others at greater risk include:  Musicians.  Carpenters, painters, and plumbers.  Cooks.  Cashiers.  People who work in [employer].  Architect workers.  Butchers.  People who use computers. What are the signs or symptoms? Symptoms of this condition include:  Pain and tenderness in the forearm and the outer part of the elbow. Pain may be felt only when using the arm, or it may be there all the time.  A burning feeling that starts in the elbow and spreads down the forearm.  A weak grip in the hand. How is this diagnosed? This condition may be diagnosed based on:  Your symptoms and medical history.  A physical exam.  X-rays.  MRI. How is this treated? Resting and icing your arm is often the first treatment. Your health care provider may also recommend:  Medicines to reduce  pain and inflammation. These may be in the form of a pill, topical gels, or shots of a steroid medicine (cortisone).  An elbow strap to reduce stress on the area.  Physical therapy. This may include massage or exercises.  An elbow brace to restrict the movements that cause symptoms. If these treatments do not help relieve your symptoms, your health care provider may recommend surgery to remove damaged muscle and reattach healthy muscle to bone. Follow these instructions at home: Activity  Rest your elbow and wrist and avoid activities that cause symptoms, as told by your health care provider.  Do physical therapy exercises as instructed.  If you lift an object, lift it with your palm facing up. This reduces stress on your elbow. Lifestyle  If your tennis elbow is caused by sports, check your equipment and make sure that: ? You are using it correctly. ? It is the best fit for you.  If your tennis elbow is caused by work or computer use, take frequent breaks to stretch your arm. Talk with your manager about ways to manage your condition at work. If you have a brace:  Wear the brace or strap as told by your health care provider. Remove it only as told by your health care provider.  Loosen the brace if your fingers tingle, become numb, or turn cold and blue.  Keep the brace clean.  If the brace is not waterproof, ask if you may remove it for bathing. If you must keep the brace on while bathing: ? Do  not let it get wet. ? Cover it with a watertight covering when you take a bath or a shower. General instructions   If directed, put ice on the painful area: ? Put ice in a plastic bag. ? Place a towel between your skin and the bag. ? Leave the ice on for 20 minutes, 2-3 times a day.  Take over-the-counter and prescription medicines only as told by your health care provider.  Keep all follow-up visits as told by your health care provider. This is important. Contact a health care  provider if:  You have pain that gets worse or does not get better with treatment.  You have numbness or weakness in your forearm, hand, or fingers. Summary  Tennis elbow (lateral epicondylitis) is inflammation of tendons in your outer forearm, near your elbow.  Common symptoms include pain and tenderness in your forearm and the outer part of your elbow.  This condition is usually caused by repeatedly extending your wrist, turning your forearm, and using your hands.  The first treatment is often resting and icing your arm to relieve symptoms. Further treatment may include taking medicine, getting physical therapy, wearing a brace or strap, or having surgery. This information is not intended to replace advice given to you by your health care provider. Make sure you discuss any questions you have with your health care provider. Document Revised: 06/12/2018 Document Reviewed: 07/01/2017 Elsevier Patient Education  [place].

## 2020-01-11 ENCOUNTER — Encounter: Payer: Self-pay | Admitting: Primary Care

## 2020-01-11 ENCOUNTER — Ambulatory Visit (INDEPENDENT_AMBULATORY_CARE_PROVIDER_SITE_OTHER): Payer: Medicare Other | Admitting: Primary Care

## 2020-01-11 ENCOUNTER — Other Ambulatory Visit: Payer: Self-pay

## 2020-01-11 DIAGNOSIS — M25521 Pain in right elbow: Secondary | ICD-10-CM | POA: Diagnosis not present

## 2020-01-11 LAB — CBC
HCT: 42.6 % (ref 36.0–46.0)
Hemoglobin: 14.2 g/dL (ref 12.0–15.0)
MCHC: 33.3 g/dL (ref 30.0–36.0)
MCV: 90.2 fl (ref 78.0–100.0)
Platelets: 228 10*3/uL (ref 150.0–400.0)
RBC: 4.72 Mil/uL (ref 3.87–5.11)
RDW: 14 % (ref 11.5–15.5)
WBC: 7.4 10*3/uL (ref 4.0–10.5)

## 2020-01-11 LAB — BASIC METABOLIC PANEL
BUN: 26 mg/dL — ABNORMAL HIGH (ref 6–23)
CO2: 28 mEq/L (ref 19–32)
Calcium: 9.5 mg/dL (ref 8.4–10.5)
Chloride: 106 mEq/L (ref 96–112)
Creatinine, Ser: 1.29 mg/dL — ABNORMAL HIGH (ref 0.40–1.20)
GFR: 40.77 mL/min — ABNORMAL LOW (ref >60.00)
Glucose, Bld: 84 mg/dL (ref 70–99)
Potassium: 4.8 mEq/L (ref 3.5–5.1)
Sodium: 140 mEq/L (ref 135–145)

## 2020-01-11 NOTE — Patient Instructions (Signed)
Stop by the lab prior to leaving today. I will notify you of your results once received.   You can try diclofenac (Voltaren) gel up to three times daily as needed for pain.   You can take the Meloxicam once daily as needed for pain/inflammation.   Rest your right arm. Apply a brace for support.  Schedule a visit with Dr. Lorelei Pont if no improvement.  It was a pleasure to see you today!

## 2020-01-11 NOTE — Assessment & Plan Note (Signed)
Temporary improvement while on prednisone taper, now symptoms are worse.  Suspect her fine motor movements playing games and texting on her phone is contributing. Discussed to rest right hand/extremity when possible.   Repeat BMP pending for renal function. If renal function normal then will trial Meloxicam 15 mg. Discussed use of topical Voltaren gel. Discussed bracing for support.   If no improvement then she will set up a visit with sports medicine.

## 2020-01-11 NOTE — Progress Notes (Signed)
Subjective:    Patient ID: Jessica [name], female    DOB: July 28, 1951, 69 y.o.   MRN: [id number]  HPI  This visit occurred during the SARS-CoV-2 public health emergency.  Safety protocols were in place, including screening questions prior to the visit, additional usage of staff PPE, and extensive cleaning of exam room while observing appropriate contact time as indicated for disinfecting solutions.   Jessica [name] is a 69 year old female with a history of right rotator cuff repair, osteoporosis, renal stones, tobacco abuse, hyperlipidemia who presents today with a chief compliant of elbow pain.  She was initially evaluated on 12/17/19 by Dr. Diona Browner for symptoms right elbow pain for the past one week after completing yard work. She had been taking Tylenol without improvement. Diagnosed with epicondylitis and treated with prednisone taper.  Since her last visit she noticed improvement (without resolve) while taking prednisone. Once she completed her course of prednisone she noticed a return of her pain that actually now feels worse. Her pain is located proximally, distally to the right medial elbow, also just medial to the elbow. Pain is worse at night, also worse with movement especially extension. She's noticed swelling to the site, no redness, trauma/injury.  She's been taking hot baths, using ice packs, taking Ibuprofen/Tylenol with temporary improvement. History of right rotator cuff repair in 2015. She admits to playing on her computer and phone a lot, text messages often on her phone using her right hand.  Review of Systems  Musculoskeletal: Positive for arthralgias, joint swelling and myalgias.  Skin: Negative for color change.  Neurological: Negative for weakness and numbness.       Past Medical History:  Diagnosis Date  . Arthrosis of left acromioclavicular joint 09/16/2014  . COPD (chronic obstructive pulmonary disease) (HCC)    infreq exac, continued tobacco abuse  .  Dyslipidemia   . GERD (gastroesophageal reflux disease)   . History of kidney stones   . Osteoarthritis of knee    R knee  . Seasonal allergies      Social History   Socioeconomic History  . Marital status: Married    Spouse name: Not on file  . Number of children: Not on file  . Years of education: Not on file  . Highest education level: Not on file  Occupational History  . Not on file  Tobacco Use  . Smoking status: Current Every Day Smoker    Packs/day: 1.00    Years: 51.00    Pack years: 51.00  . Smokeless tobacco: Never Used  . Tobacco comment: Contemplation phase; 08-02-2019 "about 0.5 to 1 pack"   Substance and Sexual Activity  . Alcohol use: Yes    Alcohol/week: 0.0 standard drinks    Comment: Infrequently   . Drug use: No  . Sexual activity: Not on file  Other Topics Concern  . Not on file  Social History Narrative  . Not on file   Social Determinants of Health   Financial Resource Strain:   . Difficulty of Paying Living Expenses:   Food Insecurity:   . Worried About Charity fundraiser in the Last Year:   . Arboriculturist in the Last Year:   Transportation Needs:   . Film/video editor (Medical):   Marland Kitchen Lack of Transportation (Non-Medical):   Physical Activity:   . Days of Exercise per Week:   . Minutes of Exercise per Session:   Stress:   . Feeling of Stress :  Social Connections:   . Frequency of Communication with Friends and Family:   . Frequency of Social Gatherings with Friends and Family:   . Attends Religious Services:   . Active Member of Clubs or Organizations:   . Attends Archivist Meetings:   Marland Kitchen Marital Status:   Intimate Partner Violence:   . Fear of Current or Ex-Partner:   . Emotionally Abused:   Marland Kitchen Physically Abused:   . Sexually Abused:     Past Surgical History:  Procedure Laterality Date  . ABDOMINAL HYSTERECTOMY  1992  . APPENDECTOMY  1995  . CYSTOSCOPY/URETEROSCOPY/HOLMIUM LASER/STENT PLACEMENT Right  08/03/2019   Procedure: CYSTOSCOPY/URETEROSCOPY/STENT PLACEMENT;  Surgeon: Robley Fries, MD;  Location: [facility];  Service: Urology;  Laterality: Right;  90 MINS  . CYSTOSCOPY/URETEROSCOPY/HOLMIUM LASER/STENT PLACEMENT Right 08/17/2019   Procedure: CYSTOSCOPY/URETEROSCOPY/HOLMIUM LASER/STENT EXCHANGED;  Surgeon: Robley Fries, MD;  [facility];  Service: Urology;  Laterality: Right;  . MOUTH SURGERY     implants  . right knee surgery  88 & 02   "torn cartledge"  . ROTATOR CUFF REPAIR Right 2015  . TUBAL LIGATION  1980    Family History  Problem Relation Age of Onset  . Hypertension Mother   . Heart disease Mother   . Multiple sclerosis Father   . Breast cancer Maternal Aunt   . Stroke Other   . Hypertension Brother   . Colon cancer Neg Hx     Allergies  Allergen Reactions  . Chantix [Varenicline] Nausea And Vomiting    Dizzy, nausea only with the higher dose    Current Outpatient Medications on File Prior to Visit  Medication Sig Dispense Refill  . alendronate (FOSAMAX) 70 MG tablet Take 1 tablet by mouth once weekly with a full glass of water on an empty stomach. Do not lay flat 2 hours after taking. 12 tablet 3  . atorvastatin (LIPITOR) 40 MG tablet Take 1 tablet by mouth at bedtime for cholesterol. 90 tablet 3  . buPROPion (WELLBUTRIN SR) 150 MG 12 hr tablet Take 1 tablet (150 mg total) by mouth 2 (two) times daily. 60 tablet 1   No current facility-administered medications on file prior to visit.    BP 106/64   Pulse 73   Temp (!) 96.6 F (35.9 C) (Temporal)   Ht 5\' 2"  (1.575 m)   Wt 150 lb 12 oz (68.4 kg)   SpO2 100%   BMI 27.57 kg/m    Objective:   Physical Exam  Constitutional: She appears well-nourished.  Musculoskeletal:     Right elbow: Swelling present. No deformity. Normal range of motion. Tenderness present.       Arms:     Comments: Tender just medial to medial epicondyle. No warmth. Mild swelling to that site.     Skin: Skin is warm and dry. No erythema.  No warmth to the site           Assessment & Plan:

## 2020-01-15 DIAGNOSIS — M7711 Lateral epicondylitis, right elbow: Secondary | ICD-10-CM | POA: Insufficient documentation

## 2020-01-15 NOTE — Assessment & Plan Note (Signed)
Apply ice to area.  Can wear brace to support arm.  Complete prednisone taper.

## 2020-01-19 ENCOUNTER — Encounter: Payer: Self-pay | Admitting: Family Medicine

## 2020-01-19 ENCOUNTER — Other Ambulatory Visit: Payer: Self-pay

## 2020-01-19 ENCOUNTER — Ambulatory Visit (INDEPENDENT_AMBULATORY_CARE_PROVIDER_SITE_OTHER): Payer: Medicare Other | Admitting: Family Medicine

## 2020-01-19 VITALS — BP 90/60 | HR 73 | Temp 98.1°F | Ht 62.0 in | Wt 150.8 lb

## 2020-01-19 DIAGNOSIS — M7711 Lateral epicondylitis, right elbow: Secondary | ICD-10-CM

## 2020-01-19 MED ORDER — METHYLPREDNISOLONE ACETATE 40 MG/ML IJ SUSP
20.0000 mg | Freq: Once | INTRAMUSCULAR | Status: AC
  Administered 2020-01-19: 20 mg via INTRA_ARTICULAR

## 2020-01-19 NOTE — Progress Notes (Signed)
Jessica Neises T. Mionna Advincula, Jessica [name], [place]  [facility] [place] at [facility] [place], [address]  Phone: [phone]  FAX: [place] - 69 y.o. female  MRN [id number]  Date of Birth: 12-Feb-1951  Date: 01/19/2020  PCP: Pleas Koch, NP  Referral: Pleas Koch, NP  Chief Complaint  Patient presents with  . Elbow Pain    Right    This visit occurred during the SARS-CoV-2 public health emergency.  Safety protocols were in place, including screening questions prior to the visit, additional usage of staff PPE, and extensive cleaning of exam room while observing appropriate contact time as indicated for disinfecting solutions.   Subjective:   Jessica [name] presents with lateral elbow pain.  Length of symptoms: 2 mo Hand effected: R  Patient describes a dull ache on the lateral elbow. There is some translation in the proximal forearm and in the distal upper arm. It is painful to lift with the hand facing down and to lift with the thumb in an upright position. Supination is painful. Patient points to the lateral epicondyle as the point of maximal tenderness near ECRB.  2 months. Pred x 2. Has some CKD-3, and has been take a lot of NSAIDS In March, went outside, has a lot of flowers, pulled out a lot of flowers.  Then a couple of days later, a couple of stems out.  No trauma.   No prior fractures or operative interventions in the effective hand. Prior PT or HEP: none  Denies numbness or tingling. No significant neck or shoulder pain.  Hand of dominance: R   Review of Systems is noted in the HPI, as appropriate  Objective:   Blood pressure 90/60, pulse 73, temperature 98.1 F (36.7 C), temperature source Temporal, height 5\' 2"  (1.575 m), weight 150 lb 12 oz (68.4 kg), SpO2 100 %.  GEN: No acute distress; alert,appropriate. PULM: Breathing comfortably in no respiratory  distress PSYCH: Normally interactive.   Elbow: R Ecchymosis or edema: neg ROM: full flexion, extension, pronation, supination Shoulder ROM: Full Flexion: 5/5 Extension: 5/5, PAINFUL Supination: 5/5, PAINFUL Pronation: 5/5 Wrist ext: 5/5 Wrist flexion: 5/5 No gross bony abnormality Varus and Valgus stress: stable ECRB tenderness: YES, TTP Medial epicondyle: NT Lateral epicondyle, resisted wrist extension from wrist full pronation and flexion: PAINFUL grip: 5/5  sensation intact Tinel's, Elbow: negative  Subjective:     ICD-10-CM   1. Lateral epicondylitis of right elbow  M77.11 methylPREDNISolone acetate (DEPO-MEDROL) injection 20 mg    Level of Medical Decision-Making in this case is MODERATE.  Elbow anatomy was reviewed, and tendinopathy was explained.  Pt. given a formal rehab program from [facility] on elbow rehabiliation.  Start off with isometrics and gentle stretching and ROM progressing to a series of concentric and eccentric exercises should be done starting with no weight, work up to 1 lb, hammer, etc.  Use counterforce strap if working or using hands - not helping much  Formal PT would be beneficial if symptoms persist. Emphasized stretching an cross-friction massage Emphasized proper palms up lifting biomechanics to unload ECRB  I appreciate the opportunity to evaluate this very friendly patient. If you have any question regarding her care or prognosis, do not hesitate to ask.   Tendon Origin / Insertion Injection Procedure Note MERLY HINKSON November 29, 1950 Date of procedure: 01/19/2020  Procedure: Tendon Origin / Insertion Injection for Lateral Epicondylitis, R Indications: Pain  Procedure Details Verbal consent was obtained from the patient. Risks, benefits, and alternatives were discussed. Potential complications including loss of pigment, atrophy, and rare risk of infection were discussed. Prepped with Chloraprep and Ethyl Chloride used for anesthesia.  Under sterile conditions, the patient was injected at the point of maximal tenderness at the ECRB tendon with 2 cc of Lidocaine 1% and 1 cc of Depo-Medrol 40 mg. Decreased pain after injection. No complications.  Needle size: 22 gauge 1 1/2 inch Medication: Depo-Medrol 40 mg   Follow-up: Return in about 1 month (around 02/18/2020).  Meds ordered this encounter  Medications  . methylPREDNISolone acetate (DEPO-MEDROL) injection 20 mg   No orders of the defined types were placed in this encounter.   Signed,  Jessica Deed. Keora Eccleston, Jessica [name]   Patient's Medications  New Prescriptions   No medications on file  Previous Medications   ALENDRONATE (FOSAMAX) 70 MG TABLET    Take 1 tablet by mouth once weekly with a full glass of water on an empty stomach. Do not lay flat 2 hours after taking.   ATORVASTATIN (LIPITOR) 40 MG TABLET    Take 1 tablet by mouth at bedtime for cholesterol.   BUPROPION (WELLBUTRIN SR) 150 MG 12 HR TABLET    Take 1 tablet (150 mg total) by mouth 2 (two) times daily.  Modified Medications   No medications on file  Discontinued Medications   No medications on file

## 2020-02-01 ENCOUNTER — Ambulatory Visit (INDEPENDENT_AMBULATORY_CARE_PROVIDER_SITE_OTHER): Payer: Medicare Other

## 2020-02-01 VITALS — BP 90/60 | HR 73 | Wt 150.0 lb

## 2020-02-01 DIAGNOSIS — Z0000 Encounter for general adult medical examination without abnormal findings: Secondary | ICD-10-CM

## 2020-02-01 NOTE — Patient Instructions (Signed)
[name] , Thank you for taking time to come for your Medicare Wellness Visit. I appreciate your ongoing commitment to your health goals. Please review the following plan we discussed and let me know if I can assist you in the future.   Screening recommendations/referrals: Colonoscopy: declined, wants to think about this Mammogram: Up to date, completed 04/28/2019 Bone Density: Up to date, completed 04/28/2019 Recommended yearly ophthalmology/optometry visit for glaucoma screening and checkup Recommended yearly dental visit for hygiene and checkup  Vaccinations: Influenza vaccine: Up to date, completed 07/17/2019 Pneumococcal vaccine: due Tdap vaccine: Up to date, completed 02/21/2012 Shingles vaccine: discussed    Advanced directives: Please bring a copy of your POA (Power of [place]) and/or Living Will to your next appointment.   Conditions/risks identified: hyperlipidemia  Next appointment: 04/21/2020 @ 11 am    Preventive Care 65 Years and Older, Female Preventive care refers to lifestyle choices and visits with your health care provider that can promote health and wellness. What does preventive care include?  A yearly physical exam. This is also called an annual well check.  Dental exams once or twice a year.  Routine eye exams. Ask your health care provider how often you should have your eyes checked.  Personal lifestyle choices, including:  Daily care of your teeth and gums.  Regular physical activity.  Eating a healthy diet.  Avoiding tobacco and drug use.  Limiting alcohol use.  Practicing safe sex.  Taking low-dose aspirin every day.  Taking vitamin and mineral supplements as recommended by your health care provider. What happens during an annual well check? The services and screenings done by your health care provider during your annual well check will depend on your age, overall health, lifestyle risk factors, and family history of disease. Counseling   Your health care provider may ask you questions about your:  Alcohol use.  Tobacco use.  Drug use.  Emotional well-being.  Home and relationship well-being.  Sexual activity.  Eating habits.  History of falls.  Memory and ability to understand (cognition).  Work and work Statistician.  Reproductive health. Screening  You may have the following tests or measurements:  Height, weight, and BMI.  Blood pressure.  Lipid and cholesterol levels. These may be checked every 5 years, or more frequently if you are over 98 years old.  Skin check.  Lung cancer screening. You may have this screening every year starting at age 20 if you have a 30-pack-year history of smoking and currently smoke or have quit within the past 15 years.  Fecal occult blood test (FOBT) of the stool. You may have this test every year starting at age 55.  Flexible sigmoidoscopy or colonoscopy. You may have a sigmoidoscopy every 5 years or a colonoscopy every 10 years starting at age 71.  Hepatitis C blood test.  Hepatitis B blood test.  Sexually transmitted disease (STD) testing.  Diabetes screening. This is done by checking your blood sugar (glucose) after you have not eaten for a while (fasting). You may have this done every 1-3 years.  Bone density scan. This is done to screen for osteoporosis. You may have this done starting at age 29.  Mammogram. This may be done every 1-2 years. Talk to your health care provider about how often you should have regular mammograms. Talk with your health care provider about your test results, treatment options, and if necessary, the need for more tests. Vaccines  Your health care provider may recommend certain vaccines, such as:  Influenza vaccine. This is recommended every year.  Tetanus, diphtheria, and acellular pertussis (Tdap, Td) vaccine. You may need a Td booster every 10 years.  Zoster vaccine. You may need this after age 55.  Pneumococcal 13-valent  conjugate (PCV13) vaccine. One dose is recommended after age 73.  Pneumococcal polysaccharide (PPSV23) vaccine. One dose is recommended after age 11. Talk to your health care provider about which screenings and vaccines you need and how often you need them. This information is not intended to replace advice given to you by your health care provider. Make sure you discuss any questions you have with your health care provider. Document Released: 10/13/2015 Document Revised: 06/05/2016 Document Reviewed: 07/18/2015 Elsevier Interactive Patient Education  2017 [place] Prevention in the Home Falls can cause injuries. They can happen to people of all ages. There are many things you can do to make your home safe and to help prevent falls. What can I do on the outside of my home?  Regularly fix the edges of walkways and driveways and fix any cracks.  Remove anything that might make you trip as you walk through a door, such as a raised step or threshold.  Trim any bushes or trees on the path to your home.  Use bright outdoor lighting.  Clear any walking paths of anything that might make someone trip, such as rocks or tools.  Regularly check to see if handrails are loose or broken. Make sure that both sides of any steps have handrails.  Any raised decks and porches should have guardrails on the edges.  Have any leaves, snow, or ice cleared regularly.  Use sand or salt on walking paths during winter.  Clean up any spills in your garage right away. This includes oil or grease spills. What can I do in the bathroom?  Use night lights.  Install grab bars by the toilet and in the tub and shower. Do not use towel bars as grab bars.  Use non-skid mats or decals in the tub or shower.  If you need to sit down in the shower, use a plastic, non-slip stool.  Keep the floor dry. Clean up any water that spills on the floor as soon as it happens.  Remove soap buildup in the tub or  shower regularly.  Attach bath mats securely with double-sided non-slip rug tape.  Do not have throw rugs and other things on the floor that can make you trip. What can I do in the bedroom?  Use night lights.  Make sure that you have a light by your bed that is easy to reach.  Do not use any sheets or blankets that are too big for your bed. They should not hang down onto the floor.  Have a firm chair that has side arms. You can use this for support while you get dressed.  Do not have throw rugs and other things on the floor that can make you trip. What can I do in the kitchen?  Clean up any spills right away.  Avoid walking on wet floors.  Keep items that you use a lot in easy-to-reach places.  If you need to reach something above you, use a strong step stool that has a grab bar.  Keep electrical cords out of the way.  Do not use floor polish or wax that makes floors slippery. If you must use wax, use non-skid floor wax.  Do not have throw rugs and other things on the floor that  can make you trip. What can I do with my stairs?  Do not leave any items on the stairs.  Make sure that there are handrails on both sides of the stairs and use them. Fix handrails that are broken or loose. Make sure that handrails are as long as the stairways.  Check any carpeting to make sure that it is firmly attached to the stairs. Fix any carpet that is loose or worn.  Avoid having throw rugs at the top or bottom of the stairs. If you do have throw rugs, attach them to the floor with carpet tape.  Make sure that you have a light switch at the top of the stairs and the bottom of the stairs. If you do not have them, ask someone to add them for you. What else can I do to help prevent falls?  Wear shoes that:  Do not have high heels.  Have rubber bottoms.  Are comfortable and fit you well.  Are closed at the toe. Do not wear sandals.  If you use a stepladder:  Make sure that it is fully  opened. Do not climb a closed stepladder.  Make sure that both sides of the stepladder are locked into place.  Ask someone to hold it for you, if possible.  Clearly mark and make sure that you can see:  Any grab bars or handrails.  First and last steps.  Where the edge of each step is.  Use tools that help you move around (mobility aids) if they are needed. These include:  Canes.  Walkers.  Scooters.  Crutches.  Turn on the lights when you go into a dark area. Replace any light bulbs as soon as they burn out.  Set up your furniture so you have a clear path. Avoid moving your furniture around.  If any of your floors are uneven, fix them.  If there are any pets around you, be aware of where they are.  Review your medicines with your doctor. Some medicines can make you feel dizzy. This can increase your chance of falling. Ask your doctor what other things that you can do to help prevent falls. This information is not intended to replace advice given to you by your health care provider. Make sure you discuss any questions you have with your health care provider. Document Released: 07/13/2009 Document Revised: 02/22/2016 Document Reviewed: 10/21/2014 Elsevier Interactive Patient Education  2017 [employer].

## 2020-02-01 NOTE — Progress Notes (Signed)
PCP notes:  Health Maintenance: Pneumococcal 23- due Colonoscopy- declined, wants to think about this first   Abnormal Screenings: none   Patient concerns: none   Nurse concerns: none   Next PCP appt.: 04/21/2020 @ 11 am

## 2020-02-01 NOTE — Progress Notes (Signed)
Subjective:   RHEALYN Keizer is a 69 y.o. female who presents for an Initial Medicare Annual Wellness Visit.  Review of Systems: N/A      This visit is being conducted through telemedicine via telephone at the nurse health advisor's home address due to the COVID-19 pandemic. This patient has given me verbal consent via doximity to conduct this visit, patient states they are participating from their home address. Patient and myself are on the telephone call. There is no referral for this visit. Some vital signs may be absent or patient reported.    Patient identification: identified by name, DOB, and current address    Cardiac Risk Factors include: advanced age (>15men, >13 women);dyslipidemia;smoking/ tobacco exposure     Objective:    Today's Vitals   02/01/20 1108  BP: 90/60  Pulse: 73  SpO2: 100%  Weight: 150 lb (68 kg)   Body mass index is 27.44 kg/m.  Advanced Directives 02/01/2020 08/17/2019 08/02/2019 08/01/2019 12/22/2015 12/08/2015 09/16/2014  Does Patient Have a Medical Advance Directive? Yes Yes Yes No Yes Yes Yes  Type of Paramedic of [place];Living will Living will Living will - - Living will;Healthcare Power of [place];Living will  Does patient want to make changes to medical advance directive? - No - Patient declined No - Patient declined - - - No - Patient declined  Copy of [place] in Chart? No - copy requested - - - No - copy requested - Yes  Would patient like information on creating a medical advance directive? - No - Patient declined - Yes (ED - Information included in AVS) - - -    Current Medications (verified) Outpatient Encounter Medications as of 02/01/2020  Medication Sig  . alendronate (FOSAMAX) 70 MG tablet Take 1 tablet by mouth once weekly with a full glass of water on an empty stomach. Do not lay flat 2 hours after taking. (Patient not taking: Reported on 01/19/2020)  .  atorvastatin (LIPITOR) 40 MG tablet Take 1 tablet by mouth at bedtime for cholesterol. (Patient not taking: Reported on 01/19/2020)  . buPROPion (WELLBUTRIN SR) 150 MG 12 hr tablet Take 1 tablet (150 mg total) by mouth 2 (two) times daily. (Patient not taking: Reported on 01/19/2020)   No facility-administered encounter medications on file as of 02/01/2020.    Allergies (verified) Chantix [varenicline]   History: Past Medical History:  Diagnosis Date  . Arthrosis of left acromioclavicular joint 09/16/2014  . COPD (chronic obstructive pulmonary disease) (HCC)    infreq exac, continued tobacco abuse  . Dyslipidemia   . GERD (gastroesophageal reflux disease)   . History of kidney stones   . Osteoarthritis of knee    R knee  . Seasonal allergies    Past Surgical History:  Procedure Laterality Date  . ABDOMINAL HYSTERECTOMY  1992  . APPENDECTOMY  1995  . CYSTOSCOPY/URETEROSCOPY/HOLMIUM LASER/STENT PLACEMENT Right 08/03/2019   Procedure: CYSTOSCOPY/URETEROSCOPY/STENT PLACEMENT;  Surgeon: Robley Fries, MD;  Location: [facility];  Service: Urology;  Laterality: Right;  90 MINS  . CYSTOSCOPY/URETEROSCOPY/HOLMIUM LASER/STENT PLACEMENT Right 08/17/2019   Procedure: CYSTOSCOPY/URETEROSCOPY/HOLMIUM LASER/STENT EXCHANGED;  Surgeon: Robley Fries, MD;  Location: [facility];  Service: Urology;  Laterality: Right;  . MOUTH SURGERY     implants  . right knee surgery  88 & 02   "torn cartledge"  . ROTATOR CUFF REPAIR Right 2015  . TUBAL LIGATION  1980   Family History  Problem Relation  Age of Onset  . Hypertension Mother   . Heart disease Mother   . Multiple sclerosis Father   . Breast cancer Maternal Aunt   . Stroke Other   . Hypertension Brother   . Colon cancer Neg Hx    Social History   Socioeconomic History  . Marital status: Married    Spouse name: Not on file  . Number of children: Not on file  . Years of education: Not on file  . Highest education level: Not  on file  Occupational History  . Not on file  Tobacco Use  . Smoking status: Current Every Day Smoker    Packs/day: 1.00    Years: 51.00    Pack years: 51.00  . Smokeless tobacco: Never Used  . Tobacco comment: Contemplation phase; 08-02-2019 "about 0.5 to 1 pack"   Substance and Sexual Activity  . Alcohol use: Yes    Alcohol/week: 0.0 standard drinks    Comment: Infrequently   . Drug use: No  . Sexual activity: Not on file  Other Topics Concern  . Not on file  Social History Narrative  . Not on file   Social Determinants of Health   Financial Resource Strain: Low Risk   . Difficulty of Paying Living Expenses: Not hard at all  Food Insecurity: No Food Insecurity  . Worried About Charity fundraiser in the Last Year: Never true  . Ran Out of Food in the Last Year: Never true  Transportation Needs: No Transportation Needs  . Lack of Transportation (Medical): No  . Lack of Transportation (Non-Medical): No  Physical Activity: Inactive  . Days of Exercise per Week: 0 days  . Minutes of Exercise per Session: 0 min  Stress: No Stress Concern Present  . Feeling of Stress : Not at all  Social Connections:   . Frequency of Communication with Friends and Family:   . Frequency of Social Gatherings with Friends and Family:   . Attends Religious Services:   . Active Member of Clubs or Organizations:   . Attends Archivist Meetings:   Marland Kitchen Marital Status:     Tobacco Counseling Ready to quit: Not Answered Counseling given: Not Answered Comment: Contemplation phase; 08-02-2019 "about 0.5 to 1 pack"    Clinical Intake:  Pre-visit preparation completed: Yes  Pain : 0-10 Pain Type: Chronic pain Pain Location: Elbow Pain Orientation: Right Pain Descriptors / Indicators: Sharp Pain Onset: More than a month ago Pain Frequency: Intermittent     Nutritional Status: BMI 25 -29 Overweight Nutritional Risks: None Diabetes: No  How often do you need to have someone help  you when you read instructions, pamphlets, or other written materials from your doctor or pharmacy?: 1 - Never What is the last grade level you completed in school?: 2 1/2 years of business college  Interpreter Needed?: No  Information entered by :: CJohnson, LPN   Activities of Daily Living In your present state of health, do you have any difficulty performing the following activities: 02/01/2020 08/17/2019  Hearing? Y N  Comment some slight hearing issues, hearing aids broke -  Vision? N N  Difficulty concentrating or making decisions? N N  Walking or climbing stairs? N N  Dressing or bathing? N N  Doing errands, shopping? N -  Preparing Food and eating ? N -  Using the Toilet? N -  In the past six months, have you accidently leaked urine? Y -  Comment stress incontinence -  Do you  have problems with loss of bowel control? N -  Managing your Medications? N -  Managing your Finances? N -  Housekeeping or managing your Housekeeping? N -  Some recent data might be hidden     Immunizations and Health Maintenance Immunization History  Administered Date(s) Administered  . Influenza Split 07/27/2012  . Influenza, High Dose Seasonal PF 07/28/2018, 07/17/2019  . Influenza-Unspecified 07/17/2019  . PFIZER SARS-COV-2 Vaccination 12/12/2019, 01/03/2020  . Pneumococcal Conjugate-13 09/29/2015  . Pneumococcal Polysaccharide-23 02/21/2012  . Td 09/30/2001  . Tdap 02/21/2012  . Zoster 02/09/2013   Health Maintenance Due  Topic Date Due  . PNA vac Low Risk Adult (2 of 2 - PPSV23) 02/20/2017  . Fecal DNA (Cologuard)  10/20/2018    Patient Care Team: Pleas Koch, NP as PCP - General (Internal Medicine)  Indicate any recent Medical Services you may have received from other than [facility] providers in the past year (date may be approximate).     Assessment:   This is a routine wellness examination for [facility].  Hearing/Vision screen  Hearing Screening   125Hz  250Hz  500Hz   1000Hz  2000Hz  3000Hz  4000Hz  6000Hz  8000Hz   Right ear:           Left ear:           Vision Screening Comments: Patient gets annual eye exams.  Dietary issues and exercise activities discussed: Current Exercise Habits: The patient does not participate in regular exercise at present, Exercise limited by: None identified  Goals    . Patient Stated     02/01/2020, I will maintain and continue medications as prescribed.       Depression Screen PHQ 2/9 Scores 02/01/2020 04/20/2018 12/08/2015 04/16/2013  PHQ - 2 Score 0 0 0 0  PHQ- 9 Score 0 - - -    Fall Risk Fall Risk  02/01/2020 12/08/2015 04/16/2013  Falls in the past year? 0 No No  Number falls in past yr: 0 - -  Injury with Fall? 0 - -  Risk for fall due to : No Fall Risks - -  Follow up Falls evaluation completed;Falls prevention discussed - -    Is the patient's home free of loose throw rugs in walkways, pet beds, electrical cords, etc?   yes      Grab bars in the bathroom? no      Handrails on the stairs?   yes      Adequate lighting?   yes  Timed Get Up and Go Performed: N/A  Cognitive Function: MMSE - Mini Mental State Exam 02/01/2020  Orientation to time 5  Orientation to Place 5  Registration 3  Attention/ Calculation 5  Recall 3  Language- repeat 1       Mini Cog  Mini-Cog screen was completed. Maximum score is 22. A value of 0 denotes this part of the MMSE was not completed or the patient failed this part of the Mini-Cog screening.  Screening Tests Health Maintenance  Topic Date Due  . PNA vac Low Risk Adult (2 of 2 - PPSV23) 02/20/2017  . Fecal DNA (Cologuard)  10/20/2018  . INFLUENZA VACCINE  04/30/2020  . MAMMOGRAM  04/27/2021  . TETANUS/TDAP  02/20/2022  . DEXA SCAN  Completed  . COVID-19 Vaccine  Completed  . Hepatitis C Screening  Completed    Qualifies for Shingles Vaccine: Yes   Cancer Screenings: Lung: Low Dose CT Chest recommended if Age 55-80 years, 30 pack-year currently smoking OR have quit  w/in 15  years. Patient does qualify. Completed 06/08/2019 Breast: Up to date on Mammogram: Yes, completed 04/28/2019  Up to date of Bone Density/Dexa: Yes , completed 04/28/2019 Colorectal: declined, wants to think about this  Additional Screenings:  Hepatitis C Screening: 10/16/2015     Plan:    Patient will maintain and continue medications as prescribed.   I have personally reviewed and noted the following in the patient's chart:   . Medical and social history . Use of alcohol, tobacco or illicit drugs  . Current medications and supplements . Functional ability and status . Nutritional status . Physical activity . Advanced directives . List of other physicians . Hospitalizations, surgeries, and ER visits in previous 12 months . Vitals . Screenings to include cognitive, depression, and falls . Referrals and appointments  In addition, I have reviewed and discussed with patient certain preventive protocols, quality metrics, and best practice recommendations. A written personalized care plan for preventive services as well as general preventive health recommendations were provided to patient.     Andrez Grime, LPN   01/01/7394

## 2020-02-16 ENCOUNTER — Ambulatory Visit (INDEPENDENT_AMBULATORY_CARE_PROVIDER_SITE_OTHER): Payer: Medicare Other | Admitting: Family Medicine

## 2020-02-16 ENCOUNTER — Other Ambulatory Visit: Payer: Self-pay

## 2020-02-16 ENCOUNTER — Encounter: Payer: Self-pay | Admitting: Family Medicine

## 2020-02-16 VITALS — BP 100/62 | HR 68 | Temp 97.8°F | Ht 62.0 in | Wt 151.0 lb

## 2020-02-16 DIAGNOSIS — M7711 Lateral epicondylitis, right elbow: Secondary | ICD-10-CM | POA: Diagnosis not present

## 2020-02-16 NOTE — Progress Notes (Signed)
    Manoj Enriquez T. Norva Bowe, MD, [name]  [facility] and [place] at [facility] [place], [address]  Phone: [phone]  FAX: [place] - 69 y.o. female  MRN [id number]  Date of Birth: 1951-06-23  Date: 02/16/2020  PCP: Pleas Koch, NP  Referral: Pleas Koch, NP  Chief Complaint  Patient presents with  . Follow-up    Right Elbow Pain    This visit occurred during the SARS-CoV-2 public health emergency.  Safety protocols were in place, including screening questions prior to the visit, additional usage of staff PPE, and extensive cleaning of exam room while observing appropriate contact time as indicated for disinfecting solutions.   Subjective:   Jessica [name] is a 69 y.o. very pleasant female patient with Body mass index is 27.62 kg/m. who presents with the following:  F/u LE R, s/p inj and HEP  She is doing very well and since the last time I saw her 1 month ago she has been quite diligent in doing her home rehab program.  At that point I did do a right-sided lateral epicondyle injection.  She has had some significant in her provement in pain and she believes this is approximately 70% better from a functional standpoint.  Review of Systems is note d in the HPI, as appropriate   Objective:   BP 100/62   Pulse 68   Temp 97.8 F (36.6 C) (Temporal)   Ht 5\' 2"  (1.575 m)   Wt 151 lb (68.5 kg)   SpO2 98%   BMI 27.62 kg/m    GEN: No acute distress; alert,appropriate. PULM: Breathing comfortably in no respiratory distress PSYCH: Normally interactive.    Right elbow with full extension, full flexion, supination and pronation without pain.  She does have some modest pain with extension at the wrist and at the first digit, but comparably much better compared to her prior office visit.  Neurovascularly intact throughout.  Radiology: No results found.  Assessment  and Plan:     ICD-10-CM   1. Lateral epicondylitis of right elbow  M77.11    She is doing much better and is functionally quite a bit better and improved.  At this point she can just follow-up with me as needed and continue with her home rehab program.  Follow-up: No follow-ups on file.  No orders of the defined types were placed in this encounter.  There are no discontinued medications. No orders of the defined types were placed in this encounter.   Signed,  Maud Deed. Kenry Daubert, MD   Outpatient Encounter Medications as of 02/16/2020  Medication Sig  . alendronate (FOSAMAX) 70 MG tablet Take 1 tablet by mouth once weekly with a full glass of water on an empty stomach. Do not lay flat 2 hours after taking. (Patient not taking: Reported on 01/19/2020)  . atorvastatin (LIPITOR) 40 MG tablet Take 1 tablet by mouth at bedtime for cholesterol. (Patient not taking: Reported on 01/19/2020)  . buPROPion (WELLBUTRIN SR) 150 MG 12 hr tablet Take 1 tablet (150 mg total) by mouth 2 (two) times daily. (Patient not taking: Reported on 01/19/2020)   No facility-administered encounter medications on file as of 02/16/2020.

## 2020-04-21 ENCOUNTER — Other Ambulatory Visit: Payer: Self-pay

## 2020-04-21 ENCOUNTER — Other Ambulatory Visit: Payer: Self-pay | Admitting: Primary Care

## 2020-04-21 ENCOUNTER — Ambulatory Visit (INDEPENDENT_AMBULATORY_CARE_PROVIDER_SITE_OTHER): Payer: Medicare Other | Admitting: Primary Care

## 2020-04-21 ENCOUNTER — Encounter: Payer: Self-pay | Admitting: Primary Care

## 2020-04-21 VITALS — BP 102/64 | HR 81 | Temp 96.2°F | Ht 62.0 in | Wt 147.5 lb

## 2020-04-21 DIAGNOSIS — I700 Atherosclerosis of aorta: Secondary | ICD-10-CM

## 2020-04-21 DIAGNOSIS — Z1211 Encounter for screening for malignant neoplasm of colon: Secondary | ICD-10-CM | POA: Diagnosis not present

## 2020-04-21 DIAGNOSIS — Z23 Encounter for immunization: Secondary | ICD-10-CM

## 2020-04-21 DIAGNOSIS — Z0000 Encounter for general adult medical examination without abnormal findings: Secondary | ICD-10-CM | POA: Diagnosis not present

## 2020-04-21 DIAGNOSIS — K219 Gastro-esophageal reflux disease without esophagitis: Secondary | ICD-10-CM

## 2020-04-21 DIAGNOSIS — Z720 Tobacco use: Secondary | ICD-10-CM

## 2020-04-21 DIAGNOSIS — M810 Age-related osteoporosis without current pathological fracture: Secondary | ICD-10-CM

## 2020-04-21 DIAGNOSIS — D126 Benign neoplasm of colon, unspecified: Secondary | ICD-10-CM

## 2020-04-21 DIAGNOSIS — E785 Hyperlipidemia, unspecified: Secondary | ICD-10-CM

## 2020-04-21 DIAGNOSIS — J439 Emphysema, unspecified: Secondary | ICD-10-CM

## 2020-04-21 LAB — LIPID PANEL
Cholesterol: 260 mg/dL — ABNORMAL HIGH (ref 0–200)
HDL: 45.4 mg/dL (ref >39.00)
LDL Cholesterol: 179 mg/dL — ABNORMAL HIGH (ref 0–99)
NonHDL: 214.49
Total CHOL/HDL Ratio: 6
Triglycerides: 178 mg/dL — ABNORMAL HIGH (ref 0.0–149.0)
VLDL: 35.6 mg/dL (ref 0.0–40.0)

## 2020-04-21 LAB — COMPREHENSIVE METABOLIC PANEL
ALT: 11 U/L (ref 0–35)
AST: 15 U/L (ref 0–37)
Albumin: 4.4 g/dL (ref 3.5–5.2)
Alkaline Phosphatase: 77 U/L (ref 39–117)
BUN: 23 mg/dL (ref 6–23)
CO2: 30 mEq/L (ref 19–32)
Calcium: 9.5 mg/dL (ref 8.4–10.5)
Chloride: 104 mEq/L (ref 96–112)
Creatinine, Ser: 1.12 mg/dL (ref 0.40–1.20)
GFR: 47.96 mL/min — ABNORMAL LOW (ref >60.00)
Glucose, Bld: 94 mg/dL (ref 70–99)
Potassium: 4.6 mEq/L (ref 3.5–5.1)
Sodium: 141 mEq/L (ref 135–145)
Total Bilirubin: 0.5 mg/dL (ref 0.2–1.2)
Total Protein: 7.3 g/dL (ref 6.0–8.3)

## 2020-04-21 LAB — HEMOGLOBIN A1C: Hgb A1c MFr Bld: 5.4 % (ref 4.6–6.5)

## 2020-04-21 MED ORDER — ROSUVASTATIN CALCIUM 20 MG PO TABS: 20.0000 mg | ORAL_TABLET | Freq: Every evening | ORAL | 3 refills | Status: DC

## 2020-04-21 NOTE — Assessment & Plan Note (Signed)
Asymptomatic. Lung cancer screening due this Fall. Exam today benign.

## 2020-04-21 NOTE — Assessment & Plan Note (Signed)
No recent complaints.  Continue to monitor.

## 2020-04-21 NOTE — Assessment & Plan Note (Signed)
Pneumovax due and provided today. Could not afford Shingrix. Other immunizations UTD. Mammogram due next month. Bone density scan UTD. Colonoscopy overdue, referral placed to GI.  Encouraged a healthy diet, regular exercise. Exam today benign. Labs pending.

## 2020-04-21 NOTE — Addendum Note (Signed)
Addended by: Jacqualin Combes on: 04/21/2020 03:11 PM   Modules accepted: Orders

## 2020-04-21 NOTE — Patient Instructions (Signed)
Stop by the lab prior to leaving today. I will notify you of your results once received.   You will be contacted regarding your referral to GI for the colonoscopy.  Please let us know if you have not been contacted within two weeks.   You will be contacted regarding Prolia injection for osteoporosis.   Start exercising. You should be getting 150 minutes of moderate intensity exercise weekly.  It's important to improve your diet by reducing consumption of fast food, fried food, processed snack foods, sugary drinks. Increase consumption of fresh vegetables and fruits, whole grains, water.  Ensure you are drinking 64 ounces of water daily.  It was a pleasure to see you today!   Preventive Care 40 Years and Older, Female Preventive care refers to lifestyle choices and visits with your health care provider that can promote health and wellness. This includes:  A yearly physical exam. This is also called an annual well check.  Regular dental and eye exams.  Immunizations.  Screening for certain conditions.  Healthy lifestyle choices, such as diet and exercise. What can I expect for my preventive care visit? Physical exam Your health care provider will check:  Height and weight. These may be used to calculate body mass index (BMI), which is a measurement that tells if you are at a healthy weight.  Heart rate and blood pressure.  Your skin for abnormal spots. Counseling Your health care provider may ask you questions about:  Alcohol, tobacco, and drug use.  Emotional well-being.  Home and relationship well-being.  Sexual activity.  Eating habits.  History of falls.  Memory and ability to understand (cognition).  Work and work Statistician.  Pregnancy and menstrual history. What immunizations do I need?  Influenza (flu) vaccine  This is recommended every year. Tetanus, diphtheria, and pertussis (Tdap) vaccine  You may need a Td booster every 10 years. Varicella  (chickenpox) vaccine  You may need this vaccine if you have not already been vaccinated. Zoster (shingles) vaccine  You may need this after age 31. Pneumococcal conjugate (PCV13) vaccine  One dose is recommended after age 46. Pneumococcal polysaccharide (PPSV23) vaccine  One dose is recommended after age 44. Measles, mumps, and rubella (MMR) vaccine  You may need at least one dose of MMR if you were born in 1957 or later. You may also need a second dose. Meningococcal conjugate (MenACWY) vaccine  You may need this if you have certain conditions. Hepatitis A vaccine  You may need this if you have certain conditions or if you travel or work in places where you may be exposed to hepatitis A. Hepatitis B vaccine  You may need this if you have certain conditions or if you travel or work in places where you may be exposed to hepatitis B. Haemophilus influenzae type b (Hib) vaccine  You may need this if you have certain conditions. You may receive vaccines as individual doses or as more than one vaccine together in one shot (combination vaccines). Talk with your health care provider about the risks and benefits of combination vaccines. What tests do I need? Blood tests  Lipid and cholesterol levels. These may be checked every 5 years, or more frequently depending on your overall health.  Hepatitis C test.  Hepatitis B test. Screening  Lung cancer screening. You may have this screening every year starting at age 56 if you have a 30-pack-year history of smoking and currently smoke or have quit within the past 15 years.  Colorectal cancer  screening. All adults should have this screening starting at age 37 and continuing until age 66. Your health care provider may recommend screening at age 58 if you are at increased risk. You will have tests every 1-10 years, depending on your results and the type of screening test.  Diabetes screening. This is done by checking your blood sugar  (glucose) after you have not eaten for a while (fasting). You may have this done every 1-3 years.  Mammogram. This may be done every 1-2 years. Talk with your health care provider about how often you should have regular mammograms.  BRCA-related cancer screening. This may be done if you have a family history of breast, ovarian, tubal, or peritoneal cancers. Other tests  Sexually transmitted disease (STD) testing.  Bone density scan. This is done to screen for osteoporosis. You may have this done starting at age 29. Follow these instructions at home: Eating and drinking  Eat a diet that includes fresh fruits and vegetables, whole grains, lean protein, and low-fat dairy products. Limit your intake of foods with high amounts of sugar, saturated fats, and salt.  Take vitamin and mineral supplements as recommended by your health care provider.  Do not drink alcohol if your health care provider tells you not to drink.  If you drink alcohol: ? Limit how much you have to 0-1 drink a day. ? Be aware of how much alcohol is in your drink. In the [place] one drink equals one 12 oz bottle of beer (355 mL), one 5 oz glass of wine (148 mL), or one 1 oz glass of hard liquor (44 mL). Lifestyle  Take daily care of your teeth and gums.  Stay active. Exercise for at least 30 minutes on 5 or more days each week.  Do not use any products that contain nicotine or tobacco, such as cigarettes, e-cigarettes, and chewing tobacco. If you need help quitting, ask your health care provider.  If you are sexually active, practice safe sex. Use a condom or other form of protection in order to prevent STIs (sexually transmitted infections).  Talk with your health care provider about taking a low-dose aspirin or statin. What's next?  Go to your health care provider once a year for a well check visit.  Ask your health care provider how often you should have your eyes and teeth checked.  Stay up to date on all  vaccines. This information is not intended to replace advice given to you by your health care provider. Make sure you discuss any questions you have with your health care provider. Document Revised: 09/10/2018 Document Reviewed: 09/10/2018 Elsevier Patient Education  2020 [employer].

## 2020-04-21 NOTE — Assessment & Plan Note (Signed)
Continued, not ready to quit. Lung cancer screening UTD, due again in Fall 2021.

## 2020-04-21 NOTE — Assessment & Plan Note (Signed)
Finally agreeable to repeat colonoscopy.  Referral placed to Gi.

## 2020-04-21 NOTE — Assessment & Plan Note (Signed)
Stopped atorvastatin due to "side effects". Discussed the presence of aortic atherosclerosis on her last CT chest.   Repeat lipids pending. She is agreeable to trying new statin, await lipids.

## 2020-04-21 NOTE — Progress Notes (Signed)
Subjective:    Patient ID: Jessica [name], female    DOB: [birth year], 69 y.o.   MRN: [id number]  HPI  This visit occurred during the SARS-CoV-2 public health emergency.  Safety protocols were in place, including screening questions prior to the visit, additional usage of staff PPE, and extensive cleaning of exam room while observing appropriate contact time as indicated for disinfecting solutions.   Jessica [name] is a 69 year old female who presents today for complete physical.  Immunizations: -Tetanus: Completed in 2013 -Influenza: Due this season  -Shingles: Completed Zostavax  -Pneumonia: Completed Prevnar in 2016, Pneumovax in 2013 -Covid-19: Completed series   Diet: She endorses a fair diet.  Exercise: She is not exercising   Eye exam: No recent exam Dental exam: No recent exam  Mammogram: Completed in July 2020, due. Scheduled at [facility].  Dexa: Completed in 2020, osteoporosis, stopped taking Fosamax.  Colonoscopy: Completed Cologuard in 2017, due Hep C Screen: Negative Lung Cancer Screening: Due in September 2021  BP Readings from Last 3 Encounters:  04/21/20 (!) 102/64  02/16/20 100/62  02/01/20 90/60     Review of Systems  Constitutional: Negative for unexpected weight change.  HENT: Negative for rhinorrhea.   Eyes: Negative for visual disturbance.  Respiratory: Negative for shortness of breath.   Cardiovascular: Negative for chest pain.  Gastrointestinal: Negative for constipation and diarrhea.  Genitourinary: Negative for difficulty urinating.  Musculoskeletal: Negative for arthralgias and myalgias.  Skin: Negative for rash.  Allergic/Immunologic: Negative for environmental allergies.  Neurological: Negative for dizziness, numbness and headaches.  Hematological: Negative for adenopathy.  Psychiatric/Behavioral: The patient is not nervous/anxious.        Past Medical History:  Diagnosis Date  . Arthrosis of left acromioclavicular joint 09/16/2014  .  COPD (chronic obstructive pulmonary disease) (HCC)    infreq exac, continued tobacco abuse  . Dyslipidemia   . GERD (gastroesophageal reflux disease)   . History of kidney stones   . Osteoarthritis of knee    R knee  . Seasonal allergies      Social History   Socioeconomic History  . Marital status: Married    Spouse name: Not on file  . Number of children: Not on file  . Years of education: Not on file  . Highest education level: Not on file  Occupational History  . Not on file  Tobacco Use  . Smoking status: Current Every Day Smoker    Packs/day: 1.00    Years: 51.00    Pack years: 51.00  . Smokeless tobacco: Never Used  . Tobacco comment: Contemplation phase; 08-02-2019 "about 0.5 to 1 pack"   Vaping Use  . Vaping Use: Never used  Substance and Sexual Activity  . Alcohol use: Yes    Alcohol/week: 0.0 standard drinks    Comment: Infrequently   . Drug use: No  . Sexual activity: Not on file  Other Topics Concern  . Not on file  Social History Narrative  . Not on file   Social Determinants of Health   Financial Resource Strain: Low Risk   . Difficulty of Paying Living Expenses: Not hard at all  Food Insecurity: No Food Insecurity  . Worried About Charity fundraiser in the Last Year: Never true  . Ran Out of Food in the Last Year: Never true  Transportation Needs: No Transportation Needs  . Lack of Transportation (Medical): No  . Lack of Transportation (Non-Medical): No  Physical Activity: Inactive  . Days of  Exercise per Week: 0 days  . Minutes of Exercise per Session: 0 min  Stress: No Stress Concern Present  . Feeling of Stress : Not at all  Social Connections:   . Frequency of Communication with Friends and Family:   . Frequency of Social Gatherings with Friends and Family:   . Attends Religious Services:   . Active Member of Clubs or Organizations:   . Attends Archivist Meetings:   Marland Kitchen Marital Status:   Intimate Partner Violence: Not At Risk    . Fear of Current or Ex-Partner: No  . Emotionally Abused: No  . Physically Abused: No  . Sexually Abused: No    Past Surgical History:  Procedure Laterality Date  . ABDOMINAL HYSTERECTOMY  1992  . APPENDECTOMY  1995  . CYSTOSCOPY/URETEROSCOPY/HOLMIUM LASER/STENT PLACEMENT Right 08/03/2019   Procedure: CYSTOSCOPY/URETEROSCOPY/STENT PLACEMENT;  Surgeon: Robley Fries, MD;  Location: [facility];  Service: Urology;  Laterality: Right;  90 MINS  . CYSTOSCOPY/URETEROSCOPY/HOLMIUM LASER/STENT PLACEMENT Right 08/17/2019   Procedure: CYSTOSCOPY/URETEROSCOPY/HOLMIUM LASER/STENT EXCHANGED;  Surgeon: Robley Fries, MD;  Location: [facility];  Service: Urology;  Laterality: Right;  . MOUTH SURGERY     implants  . right knee surgery  88 & 02   "torn cartledge"  . ROTATOR CUFF REPAIR Right 2015  . TUBAL LIGATION  1980    Family History  Problem Relation Age of Onset  . Hypertension Mother   . Heart disease Mother   . Multiple sclerosis Father   . Breast cancer Maternal Aunt   . Stroke Other   . Hypertension Brother   . Colon cancer Neg Hx     Allergies  Allergen Reactions  . Chantix [Varenicline] Nausea And Vomiting    Dizzy, nausea only with the higher dose    Current Outpatient Medications on File Prior to Visit  Medication Sig Dispense Refill  . atorvastatin (LIPITOR) 40 MG tablet Take 1 tablet by mouth at bedtime for cholesterol. (Patient not taking: Reported on 01/19/2020) 90 tablet 3   No current facility-administered medications on file prior to visit.    BP (!) 102/64   Pulse 81   Temp (!) 96.2 F (35.7 C) (Temporal)   Ht 5\' 2"  (1.575 m)   Wt 147 lb 8 oz (66.9 kg)   SpO2 97%   BMI 26.98 kg/m    Objective:   Physical Exam HENT:     Right Ear: Tympanic membrane and ear canal normal.     Left Ear: Tympanic membrane and ear canal normal.  Eyes:     Pupils: Pupils are equal, round, and reactive to light.  Cardiovascular:     Rate and Rhythm:  Normal rate and regular rhythm.  Pulmonary:     Effort: Pulmonary effort is normal.     Breath sounds: Normal breath sounds.  Abdominal:     General: Bowel sounds are normal.     Palpations: Abdomen is soft.     Tenderness: There is no abdominal tenderness.  Musculoskeletal:        General: Normal range of motion.     Cervical back: Neck supple.  Skin:    General: Skin is warm and dry.  Neurological:     Mental Status: She is alert and oriented to person, place, and time.     Cranial Nerves: No cranial nerve deficit.     Deep Tendon Reflexes:     Reflex Scores:      Patellar reflexes are 2+ on  the right side and 2+ on the left side. Psychiatric:        Mood and Affect: Mood normal.            Assessment & Plan:

## 2020-04-21 NOTE — Assessment & Plan Note (Signed)
Stopped taking alendronate due to "side effects". Agreeable to try Prolia. Repeat labs pending for calcium. Will refer to Charmaine to get her set up.

## 2020-04-24 ENCOUNTER — Encounter: Payer: Self-pay | Admitting: Gastroenterology

## 2020-05-03 ENCOUNTER — Encounter: Payer: Self-pay | Admitting: Primary Care

## 2020-05-03 DIAGNOSIS — Z1231 Encounter for screening mammogram for malignant neoplasm of breast: Secondary | ICD-10-CM | POA: Diagnosis not present

## 2020-05-17 ENCOUNTER — Other Ambulatory Visit: Payer: Self-pay

## 2020-05-17 ENCOUNTER — Ambulatory Visit (AMBULATORY_SURGERY_CENTER): Payer: Self-pay

## 2020-05-17 VITALS — Ht 62.0 in | Wt 145.0 lb

## 2020-05-17 DIAGNOSIS — Z86010 Personal history of colonic polyps: Secondary | ICD-10-CM

## 2020-05-17 MED ORDER — SUTAB 1479-225-188 MG PO TABS: 1.0000 | ORAL_TABLET | ORAL | 0 refills | Status: DC

## 2020-05-17 NOTE — Progress Notes (Signed)
No egg or soy allergy known to patient  No issues with past sedation with any surgeries or procedures No intubation problems in the past  No FH of Malignant Hyperthermia No diet pills per patient No home 02 use per patient  No blood thinners per patient  Pt denies issues with constipation  No A fib or A flutter  EMMI video via MyChart  COVID 19 guidelines implemented in [facility] today with Pt and RN  Coupon given to pt in [facility] today , Code to Pharmacy  COVID vaccines completed on 12/2019 per pt;  Due to the COVID-19 pandemic we are asking patients to follow these guidelines. Please only bring one care partner. Please be aware that your care partner may wait in the car in the parking lot or if they feel like they will be too hot to wait in the car, they may wait in the lobby on the 4th floor. All care partners are required to wear a mask the entire time (we do not have any that we can provide them), they need to practice social distancing, and we will do a Covid check for all patient's and care partners when you arrive. Also we will check their temperature and your temperature. If the care partner waits in their car they need to stay in the parking lot the entire time and we will call them on their cell phone when the patient is ready for discharge so they can bring the car to the front of the building. Also all patient's will need to wear a mask into building.  

## 2020-05-18 ENCOUNTER — Encounter: Payer: Self-pay | Admitting: Gastroenterology

## 2020-06-13 ENCOUNTER — Other Ambulatory Visit: Payer: Self-pay | Admitting: Primary Care

## 2020-06-13 DIAGNOSIS — E785 Hyperlipidemia, unspecified: Secondary | ICD-10-CM

## 2020-06-14 ENCOUNTER — Encounter: Payer: Self-pay | Admitting: Gastroenterology

## 2020-06-14 ENCOUNTER — Ambulatory Visit (AMBULATORY_SURGERY_CENTER): Payer: Medicare Other | Admitting: Gastroenterology

## 2020-06-14 ENCOUNTER — Other Ambulatory Visit: Payer: Self-pay

## 2020-06-14 VITALS — BP 110/5 | HR 69 | Temp 96.8°F | Resp 13 | Ht 62.0 in | Wt 145.0 lb

## 2020-06-14 DIAGNOSIS — D122 Benign neoplasm of ascending colon: Secondary | ICD-10-CM

## 2020-06-14 DIAGNOSIS — D123 Benign neoplasm of transverse colon: Secondary | ICD-10-CM | POA: Diagnosis not present

## 2020-06-14 DIAGNOSIS — D125 Benign neoplasm of sigmoid colon: Secondary | ICD-10-CM

## 2020-06-14 DIAGNOSIS — Z86010 Personal history of colonic polyps: Secondary | ICD-10-CM | POA: Diagnosis not present

## 2020-06-14 DIAGNOSIS — D121 Benign neoplasm of appendix: Secondary | ICD-10-CM | POA: Diagnosis not present

## 2020-06-14 MED ORDER — SODIUM CHLORIDE 0.9 % IV SOLN: 500.0000 mL | Freq: Once | INTRAVENOUS | Status: DC

## 2020-06-14 NOTE — Patient Instructions (Signed)
Handout given:  Polyps, Diverticulosis Resume previous diet  continue present medications Await pathology results   YOU HAD AN ENDOSCOPIC PROCEDURE TODAY AT [facility]:   Refer to the procedure report that was given to you for any specific questions about what was found during the examination.  If the procedure report does not answer your questions, please call your gastroenterologist to clarify.  If you requested that your care partner not be given the details of your procedure findings, then the procedure report has been included in a sealed envelope for you to review at your convenience later.  YOU SHOULD EXPECT: Some feelings of bloating in the abdomen. Passage of more gas than usual.  Walking can help get rid of the air that was put into your GI tract during the procedure and reduce the bloating. If you had a lower endoscopy (such as a colonoscopy or flexible sigmoidoscopy) you may notice spotting of blood in your stool or on the toilet paper. If you underwent a bowel prep for your procedure, you may not have a normal bowel movement for a few days.  Please Note:  You might notice some irritation and congestion in your nose or some drainage.  This is from the oxygen used during your procedure.  There is no need for concern and it should clear up in a day or so.  SYMPTOMS TO REPORT IMMEDIATELY:   Following lower endoscopy (colonoscopy or flexible sigmoidoscopy):  Excessive amounts of blood in the stool  Significant tenderness or worsening of abdominal pains  Swelling of the abdomen that is new, acute  Fever of 100F or higher     For urgent or emergent issues, a gastroenterologist can be reached at any hour by calling [phone]. Do not use MyChart messaging for urgent concerns.    DIET:  We do recommend a small meal at first, but then you may proceed to your regular diet.  Drink plenty of fluids but you should avoid alcoholic beverages for 24 hours.  ACTIVITY:   You should plan to take it easy for the rest of today and you should NOT DRIVE or use heavy machinery until tomorrow (because of the sedation medicines used during the test).    FOLLOW UP: Our staff will call the number listed on your records 48-72 hours following your procedure to check on you and address any questions or concerns that you may have regarding the information given to you following your procedure. If we do not reach you, we will leave a message.  We will attempt to reach you two times.  During this call, we will ask if you have developed any symptoms of COVID 19. If you develop any symptoms (ie: fever, flu-like symptoms, shortness of breath, cough etc.) before then, please call [phone].  If you test positive for Covid 19 in the 2 weeks post procedure, please call and report this information to [place].    If any biopsies were taken you will be contacted by phone or by letter within the next 1-3 weeks.  Please call us at [phone] if you have not heard about the biopsies in 3 weeks.    SIGNATURES/CONFIDENTIALITY: You and/or your care partner have signed paperwork which will be entered into your electronic medical record.  These signatures attest to the fact that that the information above on your After Visit Summary has been reviewed and is understood.  Full responsibility of the confidentiality of this discharge information lies with you and/or your  care-partner.

## 2020-06-14 NOTE — Op Note (Signed)
[place] Patient Name: Jessica [name] Procedure Date: 06/14/2020 11:17 AM MRN: [id number] Endoscopist: Remo Lipps P. Havery Moros , MD Age: 69 Referring MD:  Date of Birth: 06/14/1951 Gender: Female Account #: [id number] Procedure:                Colonoscopy Indications:              High risk colon cancer surveillance: Personal                            history of colonic polyps (12 polyps removed 2017 -                            recommended repeat in one year) Medicines:                Monitored Anesthesia Care Procedure:                Pre-Anesthesia Assessment:                           - Prior to the procedure, a History and Physical                            was performed, and patient medications and                            allergies were reviewed. The patient's tolerance of                            previous anesthesia was also reviewed. The risks                            and benefits of the procedure and the sedation                            options and risks were discussed with the patient.                            All questions were answered, and informed consent                            was obtained. Prior Anticoagulants: The patient has                            taken no previous anticoagulant or antiplatelet                            agents. ASA Grade Assessment: II - A patient with                            mild systemic disease. After reviewing the risks                            and benefits, the patient was deemed in  satisfactory condition to undergo the procedure.                           After obtaining informed consent, the colonoscope                            was passed under direct vision. Throughout the                            procedure, the patient's blood pressure, pulse, and                            oxygen saturations were monitored continuously. The                            Colonoscope was  introduced through the anus and                            advanced to the the cecum, identified by                            appendiceal orifice and ileocecal valve. The                            colonoscopy was performed without difficulty. The                            patient tolerated the procedure well. The quality                            of the bowel preparation was adequate. The                            ileocecal valve, appendiceal orifice, and rectum                            were photographed. Scope In: 11:23:05 AM Scope Out: 12:00:03 PM Scope Withdrawal Time: 0 hours 31 minutes 7 seconds  Total Procedure Duration: 0 hours 36 minutes 58 seconds  Findings:                 The perianal and digital rectal examinations were                            normal.                           A 4 mm polyp was found in the appendiceal orifice                            (the patient has a history of appendectomy). The                            polyp was sessile. The polyp was removed with a  cold snare. Resection and retrieval were complete.                           A polyp was noted in the ascending colon on the                            back of a fold. Very difficult to visualize in                            entirety, retroflexed views were somewhat limited                            due to restricted mobility of the colon. Initially                            I thought this was about 8-63mm in size and removed                            with cold snare, however after the initial pass                            (which removed most of the lesion) more polyp on                            the back side of the fold not appreciated initially                            became visualized. It was able to be removed in                            piecemeal fashion with cold snare, total size                            roughly 15-16 mm or so in total size. The  polyp was                            sessile. Resection and retrieval were complete.                            Area was observed for a few minutes and no                            persistent bleeding / oozing noted. Area across the                            lumen from the polyp was tattooed with an injection                            of Spot (carbon black).                           A 4 mm  polyp was found in the transverse colon. The                            polyp was sessile. The polyp was removed with a                            cold snare. Resection and retrieval were complete.                           A 3 mm polyp was found in the sigmoid colon. The                            polyp was sessile. The polyp was removed with a                            cold snare. Resection and retrieval were complete.                           A few small-mouthed diverticula were found in the                            ascending colon and left colon.                           Internal hemorrhoids were found during retroflexion.                           Restricted mobility was noted in the left colon.                            The exam was otherwise without abnormality. Complications:            No immediate complications. Estimated blood loss:                            Minimal. Estimated Blood Loss:     Estimated blood loss was minimal. Impression:               - One 4 mm polyp at the appendiceal orifice,                            removed with a cold snare. Resected and retrieved.                           - One 15 to 16 mm polyp in the ascending colon,                            removed with a cold snare. Resected and retrieved.                            Tattooed.                           - One 4 mm polyp in the  transverse colon, removed                            with a cold snare. Resected and retrieved.                           - One 3 mm polyp in the sigmoid colon, removed with                             a cold snare. Resected and retrieved.                           - Diverticulosis in the ascending colon and in the                            left colon.                           - Internal hemorrhoids.                           - The examination was otherwise normal. Recommendation:           - Patient has a contact number available for                            emergencies. The signs and symptoms of potential                            delayed complications were discussed with the                            patient. Return to normal activities tomorrow.                            Written discharge instructions were provided to the                            patient.                           - Resume previous diet.                           - Continue present medications.                           - Await pathology results. Remo Lipps P. Jarika Robben, MD 06/14/2020 12:12:43 PM This report has been signed electronically.

## 2020-06-14 NOTE — Progress Notes (Signed)
Called to room to assist during endoscopic procedure.  Patient ID and intended procedure confirmed with present staff. Received instructions for my participation in the procedure from the performing physician.  

## 2020-06-14 NOTE — Progress Notes (Signed)
PT taken to PACU. Monitors in place. VSS. Report given to RN. 

## 2020-06-16 ENCOUNTER — Telehealth: Payer: Self-pay | Admitting: *Deleted

## 2020-06-16 NOTE — Telephone Encounter (Signed)
  Follow up Call-  Call back number 06/14/2020  Post procedure Call Back phone  # [phone]  Permission to leave phone message Yes  Some recent data might be hidden     Patient questions:  Do you have a fever, pain , or abdominal swelling? No. Pain Score  0 *  Have you tolerated food without any problems? Yes.    Have you been able to return to your normal activities? Yes.    Do you have any questions about your discharge instructions: Diet   No. Medications  No. Follow up visit  No.  Do you have questions or concerns about your Care? No.  Actions: * If pain score is 4 or above: No action needed, pain <4  1. Have you developed a fever since your procedure? NO  2.   Have you had an respiratory symptoms (SOB or cough) since your procedure? NO  3.   Have you tested positive for COVID 19 since your procedure NO  4.   Have you had any family members/close contacts diagnosed with the COVID 19 since your procedure?  NO   If yes to any of these questions please route to Joylene John, RN and Joella Prince, RN

## 2020-06-26 ENCOUNTER — Other Ambulatory Visit: Payer: Self-pay

## 2020-06-26 ENCOUNTER — Other Ambulatory Visit (INDEPENDENT_AMBULATORY_CARE_PROVIDER_SITE_OTHER): Payer: Medicare Other

## 2020-06-26 DIAGNOSIS — E785 Hyperlipidemia, unspecified: Secondary | ICD-10-CM | POA: Diagnosis not present

## 2020-06-26 LAB — LIPID PANEL
Cholesterol: 149 mg/dL (ref 0–200)
HDL: 46.1 mg/dL (ref >39.00)
LDL Cholesterol: 72 mg/dL (ref 0–99)
NonHDL: 103.15
Total CHOL/HDL Ratio: 3
Triglycerides: 156 mg/dL — ABNORMAL HIGH (ref 0.0–149.0)
VLDL: 31.2 mg/dL (ref 0.0–40.0)

## 2020-06-26 LAB — HEPATIC FUNCTION PANEL
ALT: 15 U/L (ref 0–35)
AST: 17 U/L (ref 0–37)
Albumin: 4.7 g/dL (ref 3.5–5.2)
Alkaline Phosphatase: 69 U/L (ref 39–117)
Bilirubin, Direct: 0.1 mg/dL (ref 0.0–0.3)
Total Bilirubin: 0.5 mg/dL (ref 0.2–1.2)
Total Protein: 8.1 g/dL (ref 6.0–8.3)

## 2020-06-29 ENCOUNTER — Ambulatory Visit (INDEPENDENT_AMBULATORY_CARE_PROVIDER_SITE_OTHER)
Admission: RE | Admit: 2020-06-29 | Discharge: 2020-06-29 | Disposition: A | Discharge status: Home / Self Care | Payer: Medicare Other | Source: Ambulatory Visit | Attending: Acute Care | Admitting: Acute Care

## 2020-06-29 ENCOUNTER — Other Ambulatory Visit: Payer: Self-pay

## 2020-06-29 DIAGNOSIS — Z122 Encounter for screening for malignant neoplasm of respiratory organs: Secondary | ICD-10-CM

## 2020-06-29 DIAGNOSIS — F17210 Nicotine dependence, cigarettes, uncomplicated: Secondary | ICD-10-CM

## 2020-06-29 DIAGNOSIS — Z87891 Personal history of nicotine dependence: Secondary | ICD-10-CM

## 2020-07-06 ENCOUNTER — Other Ambulatory Visit: Payer: Self-pay | Admitting: *Deleted

## 2020-07-06 DIAGNOSIS — Z87891 Personal history of nicotine dependence: Secondary | ICD-10-CM

## 2020-07-06 DIAGNOSIS — Z122 Encounter for screening for malignant neoplasm of respiratory organs: Secondary | ICD-10-CM

## 2020-07-06 DIAGNOSIS — F17210 Nicotine dependence, cigarettes, uncomplicated: Secondary | ICD-10-CM

## 2020-07-06 NOTE — Progress Notes (Signed)
I have called the patient with the results of her low dose CT.  I explained that her scan was read as a Lung  RADS 3, nodules that are probably benign findings, short term follow up suggested: includes nodules with a low likelihood of becoming a clinically active cancer. Radiology recommends a 6 month repeat LDCT follow up. I explained that this will be in 11/2020, and that we will call closer to the time to get this scheduled. She verbalized understanding and had no further questions at completion of the call.   Langley Gauss, please place order for 6 month follow up, and fax results to PCP. Thanks so much

## 2020-10-06 ENCOUNTER — Ambulatory Visit (INDEPENDENT_AMBULATORY_CARE_PROVIDER_SITE_OTHER): Payer: Medicare Other | Admitting: Primary Care

## 2020-10-06 ENCOUNTER — Encounter: Payer: Self-pay | Admitting: Primary Care

## 2020-10-06 ENCOUNTER — Other Ambulatory Visit: Payer: Self-pay

## 2020-10-06 VITALS — BP 110/59 | HR 78 | Temp 97.3°F | Wt 145.5 lb

## 2020-10-06 DIAGNOSIS — H9202 Otalgia, left ear: Secondary | ICD-10-CM

## 2020-10-06 DIAGNOSIS — H6123 Impacted cerumen, bilateral: Secondary | ICD-10-CM | POA: Diagnosis not present

## 2020-10-06 DIAGNOSIS — H6120 Impacted cerumen, unspecified ear: Secondary | ICD-10-CM | POA: Insufficient documentation

## 2020-10-06 MED ORDER — METHYLPREDNISOLONE ACETATE 80 MG/ML IJ SUSP
80.0000 mg | Freq: Once | INTRAMUSCULAR | Status: AC
Start: 2020-10-06 — End: 2020-10-06
  Administered 2020-10-06: 80 mg via INTRAMUSCULAR

## 2020-10-06 MED ORDER — FLUTICASONE PROPIONATE 50 MCG/ACT NA SUSP: 1.0000 | Freq: Two times a day (BID) | NASAL | 0 refills | Status: DC

## 2020-10-06 NOTE — Assessment & Plan Note (Signed)
Bilaterally.  She consented verbally for irrigation. Bilateral canals irrigated, she tolerated well. No infection or abnormality post irrigation.

## 2020-10-06 NOTE — Progress Notes (Signed)
Subjective:    Patient ID: Jessica [name], female    DOB: 1951/05/28, 70 y.o.   MRN: [id number]  HPI  This visit occurred during the SARS-CoV-2 public health emergency.  Safety protocols were in place, including screening questions prior to the visit, additional usage of staff PPE, and extensive cleaning of exam room while observing appropriate contact time as indicated for disinfecting solutions.   [name] is a 70 year old female with a history of COPD, GERD, tobacco abuse, dyslipidemia who presents today with a chief complaint of otalgia.  Her pain is located to the left ear with radiation down to the left lateral neck. She has chronic sinus and post nasal drip. Denies cough, fevers, right ear pain.   She's been taking Tylenol with some improvement. She has not used nasal sprays.  BP Readings from Last 3 Encounters:  10/06/20 (!) 110/59  06/14/20 (!) 110/5  04/21/20 (!) 102/64     Review of Systems  Constitutional: Negative for fever.  HENT: Positive for ear pain and postnasal drip.   Respiratory: Negative for cough.   Allergic/Immunologic: Positive for environmental allergies.  Hematological: Negative for adenopathy.       Past Medical History:  Diagnosis Date  . Allergy    seasonal allergies  . Arthrosis of right acromioclavicular joint 2015  . Cataract    sx 2019  . Chronic kidney disease    hx kidney stones  . COPD (chronic obstructive pulmonary disease) (HCC)    infreq exac, continued tobacco abuse  . Dyslipidemia   . GERD (gastroesophageal reflux disease)    with certain foods  . History of kidney stones   . Hyperlipidemia    on meds  . Osteoarthritis of knee    R knee  . Seasonal allergies      Social History   Socioeconomic History  . Marital status: Married    Spouse name: Not on file  . Number of children: Not on file  . Years of education: Not on file  . Highest education level: Not on file  Occupational History  . Not on file   Tobacco Use  . Smoking status: Current Every Day Smoker    Packs/day: 1.50    Years: 51.00    Pack years: 76.50  . Smokeless tobacco: Never Used  . Tobacco comment: Contemplation phase; 08-02-2019 "about 0.5 to 1 pack"   Vaping Use  . Vaping Use: Never used  Substance and Sexual Activity  . Alcohol use: Yes    Alcohol/week: 0.0 standard drinks    Comment: rarely  . Drug use: No  . Sexual activity: Not on file  Other Topics Concern  . Not on file  Social History Narrative  . Not on file   Social Determinants of Health   Financial Resource Strain: Low Risk   . Difficulty of Paying Living Expenses: Not hard at all  Food Insecurity: No Food Insecurity  . Worried About Charity fundraiser in the Last Year: Never true  . Ran Out of Food in the Last Year: Never true  Transportation Needs: No Transportation Needs  . Lack of Transportation (Medical): No  . Lack of Transportation (Non-Medical): No  Physical Activity: Inactive  . Days of Exercise per Week: 0 days  . Minutes of Exercise per Session: 0 min  Stress: No Stress Concern Present  . Feeling of Stress : Not at all  Social Connections: Not on file  Intimate Partner Violence: Not At Risk  .  Fear of Current or Ex-Partner: No  . Emotionally Abused: No  . Physically Abused: No  . Sexually Abused: No    Past Surgical History:  Procedure Laterality Date  . ABDOMINAL HYSTERECTOMY  1992  . APPENDECTOMY  1995  . CYSTOSCOPY/URETEROSCOPY/HOLMIUM LASER/STENT PLACEMENT Right 08/03/2019   Procedure: CYSTOSCOPY/URETEROSCOPY/STENT PLACEMENT;  Surgeon: Robley Fries, MD;  Location: [facility];  Service: Urology;  Laterality: Right;  90 MINS  . CYSTOSCOPY/URETEROSCOPY/HOLMIUM LASER/STENT PLACEMENT Right 08/17/2019   Procedure: CYSTOSCOPY/URETEROSCOPY/HOLMIUM LASER/STENT EXCHANGED;  Surgeon: Robley Fries, MD;  Location: [facility];  Service: Urology;  Laterality: Right;  . MOUTH SURGERY  2011   dental implants  .  right knee surgery  1988   "torn cartledge"   1998/2002  . ROTATOR CUFF REPAIR Right 2015  . TONSILLECTOMY  1956  . TUBAL LIGATION  1980  . WISDOM TOOTH EXTRACTION  1970    Family History  Problem Relation Age of Onset  . Hypertension Mother   . Heart disease Mother   . Colon polyps Mother 75  . Multiple sclerosis Father   . Breast cancer Maternal Aunt   . Stroke Other   . Hypertension Brother   . Colon cancer Neg Hx   . Esophageal cancer Neg Hx   . Rectal cancer Neg Hx   . Stomach cancer Neg Hx     Allergies  Allergen Reactions  . Chantix [Varenicline] Nausea And Vomiting    Dizzy, nausea only with the higher dose    Current Outpatient Medications on File Prior to Visit  Medication Sig Dispense Refill  . rosuvastatin (CRESTOR) 20 MG tablet Take 1 tablet (20 mg total) by mouth every evening. For cholesterol. 90 tablet 3   No current facility-administered medications on file prior to visit.    BP (!) 110/59   Pulse 78   Temp (!) 97.3 F (36.3 C) (Temporal)   Wt 145 lb 8 oz (66 kg)   SpO2 94%   BMI 26.61 kg/m    Objective:   Physical Exam Constitutional:      Appearance: She is not ill-appearing.  HENT:     Head:     Jaw: No swelling or pain on movement.     Ears:     Comments: Bilateral cerumen impaction.  TM's without infection post irrigation.  Neck:     Comments: Mild swelling to right submandibular lymph noted, tender. Pulmonary:     Effort: Pulmonary effort is normal.  Neurological:     Mental Status: She is alert.            Assessment & Plan:

## 2020-10-06 NOTE — Addendum Note (Signed)
Addended by: Octaviano Glow on: 10/06/2020 01:30 PM   Modules accepted: Orders

## 2020-10-06 NOTE — Patient Instructions (Signed)
Nasal Congestion/Ear Pressure/Sinus Pressure: Try using Flonase (fluticasone) nasal spray. Instill 1 spray in each nostril twice daily.   Do this for about 1-2 weeks.   Please update me in a few days as discussed.   It was a pleasure to see you today!

## 2020-10-06 NOTE — Assessment & Plan Note (Signed)
Acute for the last two days. Exam today without evidence of infection.  Suspect inner ear involvement. IM Depo Medrol 80 mg provided today for pain and fullness.  Rx for Flonase sent to pharmacy. She will update in a week.

## 2020-10-11 DIAGNOSIS — H9202 Otalgia, left ear: Secondary | ICD-10-CM

## 2020-10-12 MED ORDER — FLUTICASONE PROPIONATE 50 MCG/ACT NA SUSP: 1.0000 | Freq: Two times a day (BID) | NASAL | 5 refills | Status: DC

## 2020-11-07 IMAGING — CT CT RENAL STONE PROTOCOL
2 of 4 series · 16 of 46 positions shown, 18 images · non-contrast
Comparison: 05/28/2019 renal sonogram.

CLINICAL DATA: Microhematuria. Lower abdominal/groin pain, right
greater than left.

EXAM:
CT ABDOMEN AND PELVIS WITHOUT CONTRAST
TECHNIQUE: Multidetector CT imaging of the abdomen and pelvis was performed
following the standard protocol without IV contrast.

[Series 3: stone study 5.0 i30f 1 · axial · 0.76mm/px · z∈[-632,-247]mm · 13 of 85 slices shown, 15 images]
[im 4/85  soft-tissue]
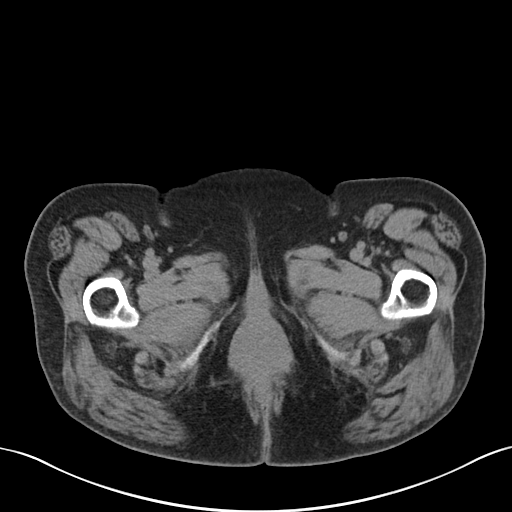
[im 4/85  bone]
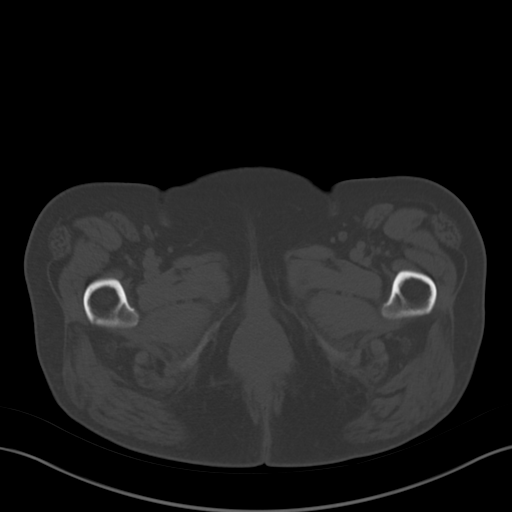
[im 11/85  soft-tissue]
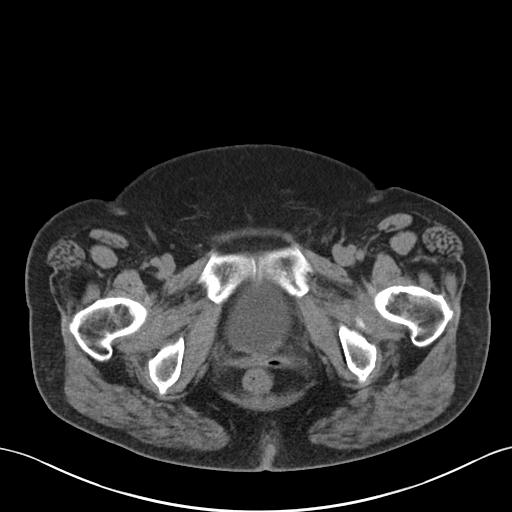
[im 17/85  soft-tissue]
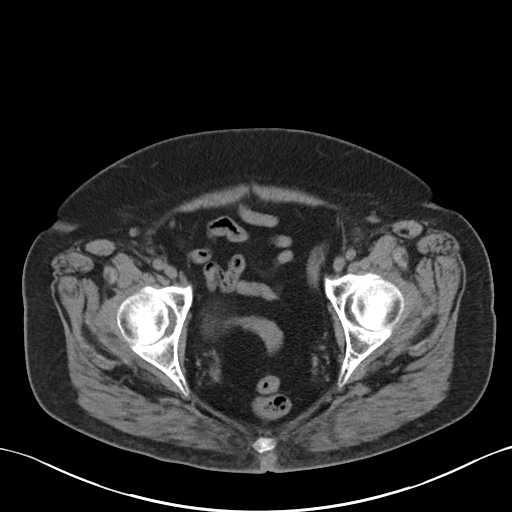
[im 24/85  soft-tissue]
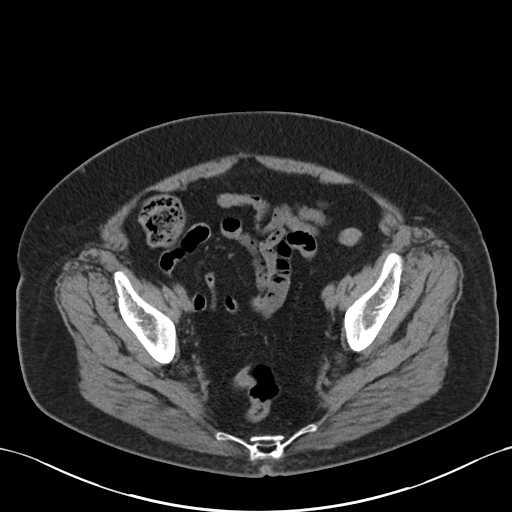
[im 31/85  soft-tissue]
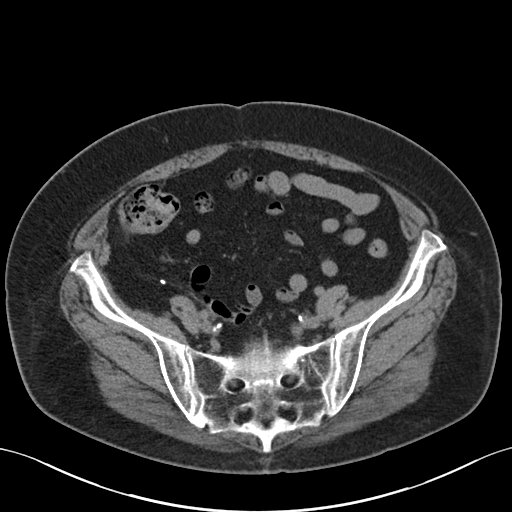
[im 37/85  soft-tissue]
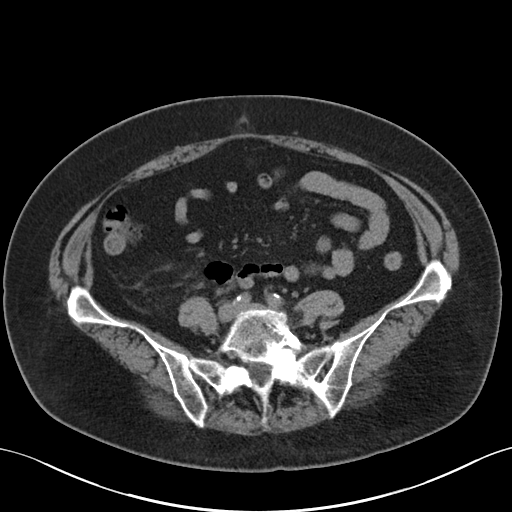
[im 44/85  soft-tissue]
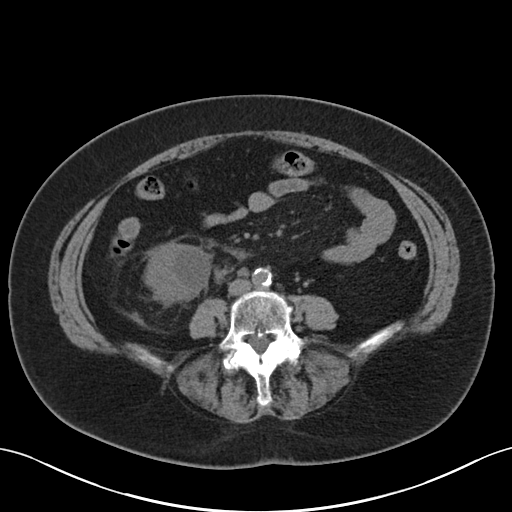
[im 48/85  soft-tissue]
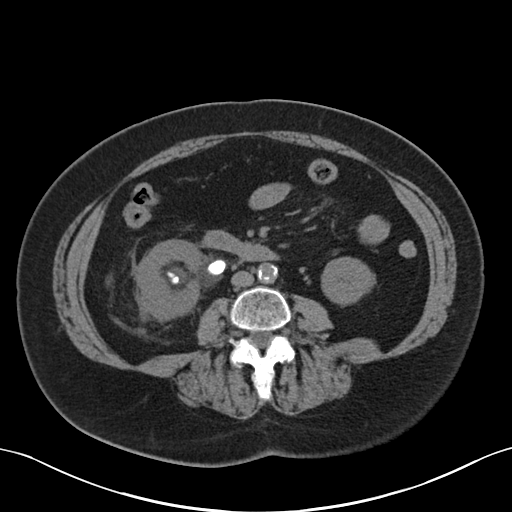
[im 54/85  soft-tissue]
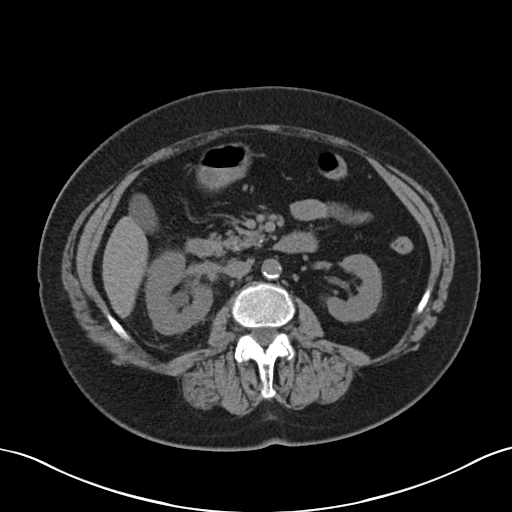
[im 54/85  bone]
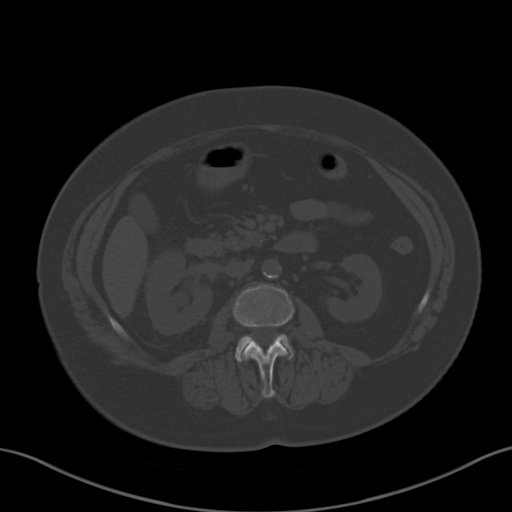
[im 61/85  soft-tissue]
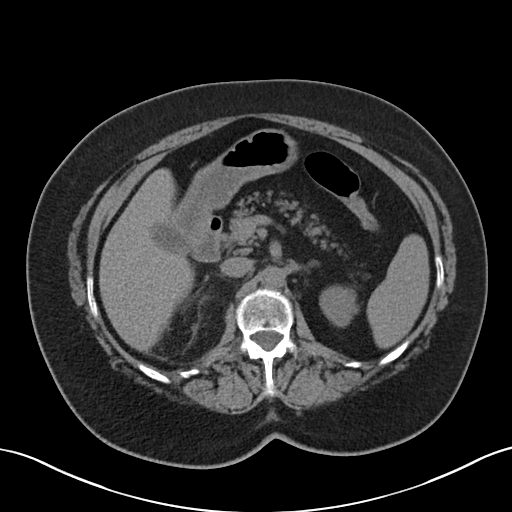
[im 68/85  soft-tissue]
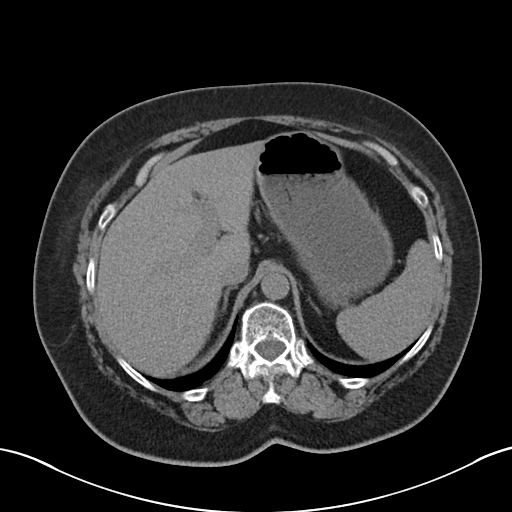
[im 74/85  soft-tissue]
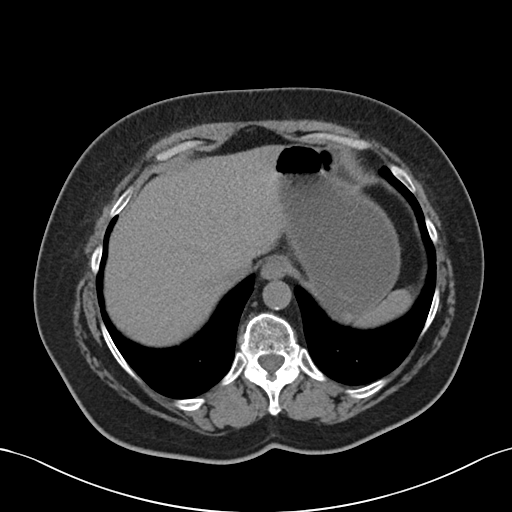
[im 81/85  soft-tissue]
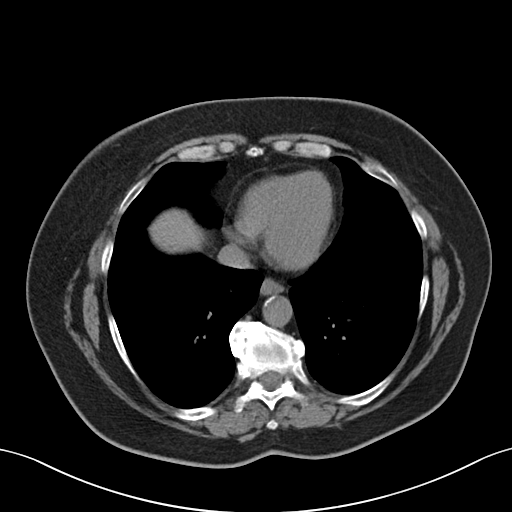

[Series 6: coronal soft tissue · coronal · 0.69mm/px · 3 of 85 slices shown]
[im 29/85  soft-tissue]
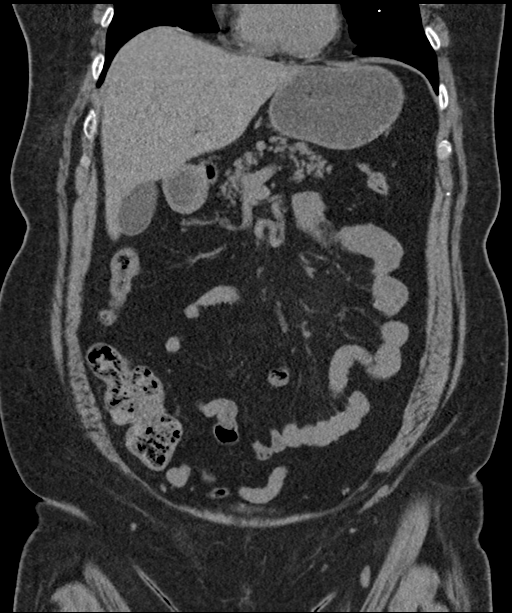
[im 38/85  soft-tissue]
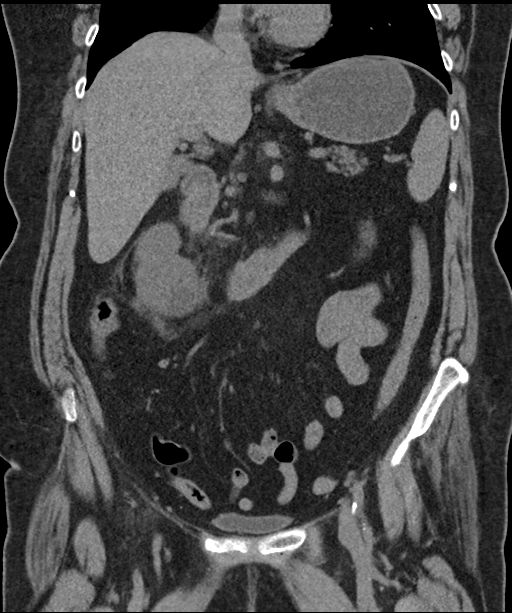
[im 47/85  soft-tissue]
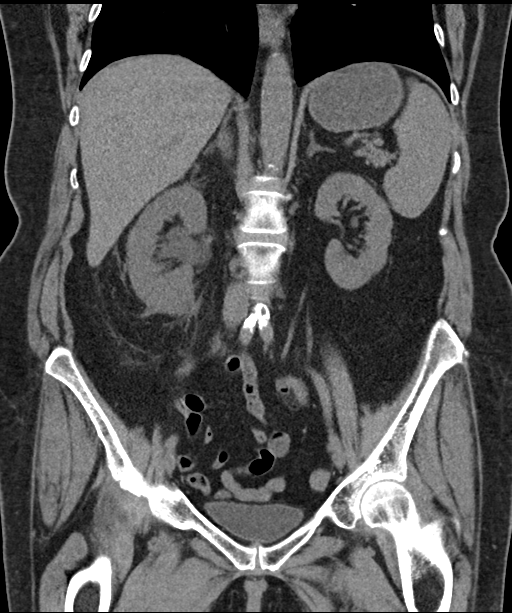

[16 of 46 positions shown; findings below may reference images not displayed]

FINDINGS: Lower chest: Centrilobular emphysema at the lung bases. Punctate
calcified lingular granuloma. No acute abnormality at the lung
bases.

Hepatobiliary: Normal liver size. No liver mass. Normal gallbladder
with no radiopaque cholelithiasis. No biliary ductal dilatation.

Pancreas: Scattered small calcifications throughout the pancreatic
head suggesting chronic pancreatitis. No pancreatic mass or duct
dilation.

Spleen: Normal size. No mass.

Adrenals/Urinary Tract: Normal adrenals. Right UPJ 13 mm stone with
mild-to-moderate right hydronephrosis and asymmetric right
perinephric fat stranding. Additional clustered nonobstructing lower
right renal stones, largest 8 mm. No left renal stones. No left
hydronephrosis. Simple appearing 3.3 cm lower right renal cyst.
Otherwise no contour deforming renal lesions. Otherwise normal
caliber ureters with no additional ureteral stones. Normal bladder.

Stomach/Bowel: Normal non-distended stomach. Normal caliber small
bowel with no small bowel wall thickening. Appendectomy. Normal
large bowel with no diverticulosis, large bowel wall thickening or
pericolonic fat stranding.

Vascular/Lymphatic: Atherosclerotic nonaneurysmal abdominal aorta.
No pathologically enlarged lymph nodes in the abdomen or pelvis.

Reproductive: Status post hysterectomy, with no abnormal findings at
the vaginal cuff. No adnexal mass.

Other: No pneumoperitoneum, ascites or focal fluid collection.

Musculoskeletal: No aggressive appearing focal osseous lesions. Mild
thoracolumbar spondylosis.
IMPRESSION: 1. Obstructing 13 mm right UPJ stone with mild-to-moderate
hydronephrosis. Additional nonobstructing lower right renal stones.
2. Chronic pancreatitis.
3. Aortic Atherosclerosis (KLRLP-33C.C) and Emphysema (KLRLP-N5O.1).

## 2020-11-10 IMAGING — US US RENAL
1 series · 14 of 25 positions shown · non-contrast
Comparison: CT 07/29/2019

CLINICAL DATA: Right flank pain, kidney stone

EXAM:
RENAL / URINARY TRACT ULTRASOUND COMPLETE

[Series 1: us renal · 14 of 56 slices shown]
[im 1/56]
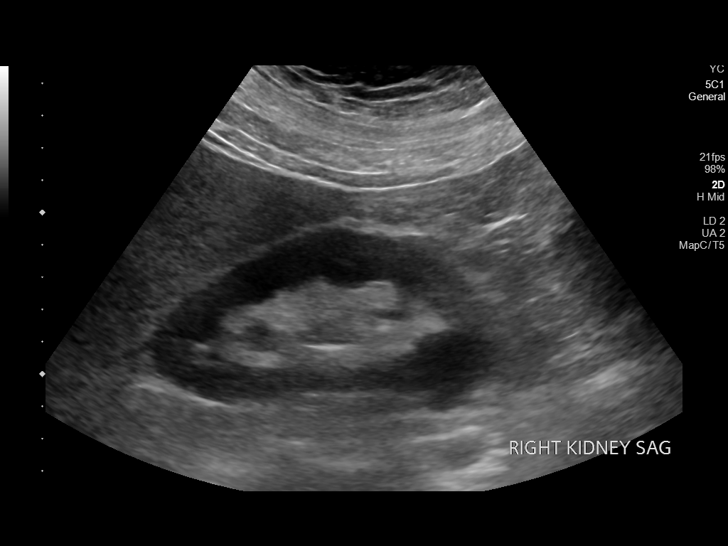
[im 5/56]
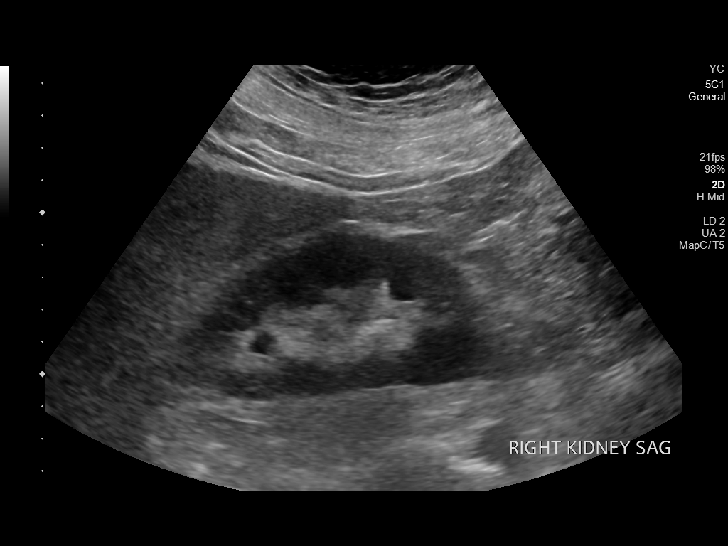
[im 10/56]
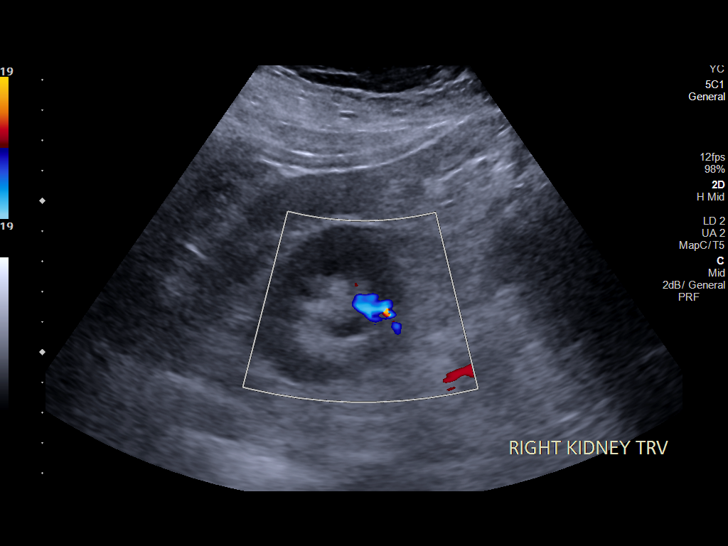
[im 14/56]
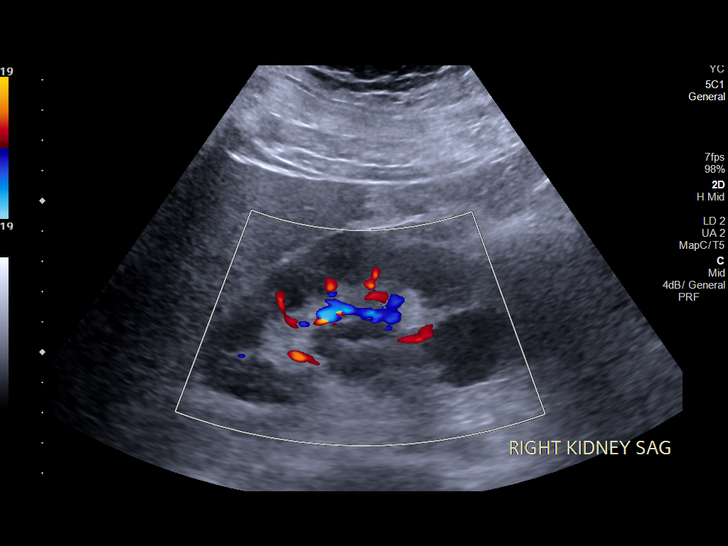
[im 19/56]
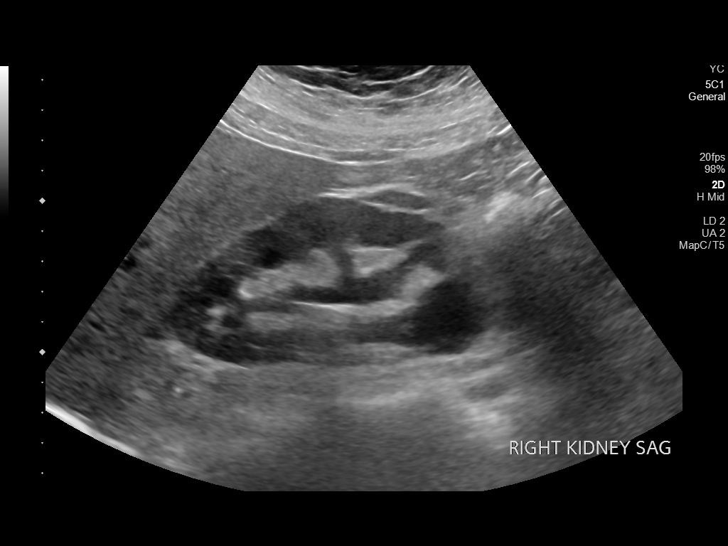
[im 21/56]
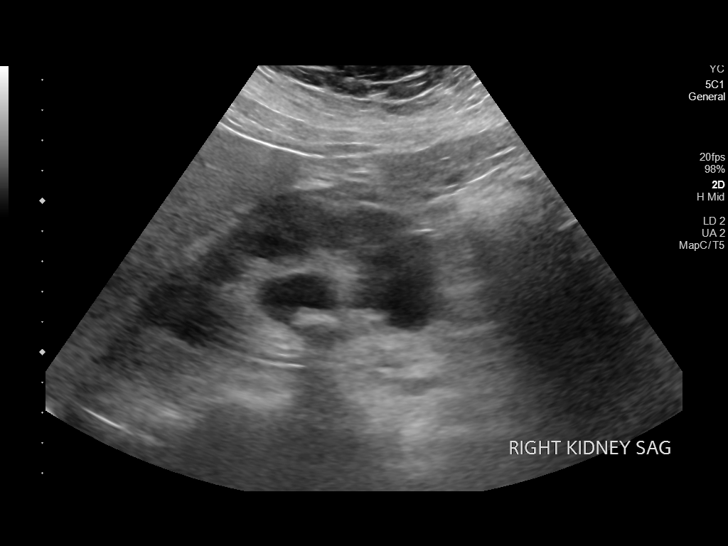
[im 26/56]
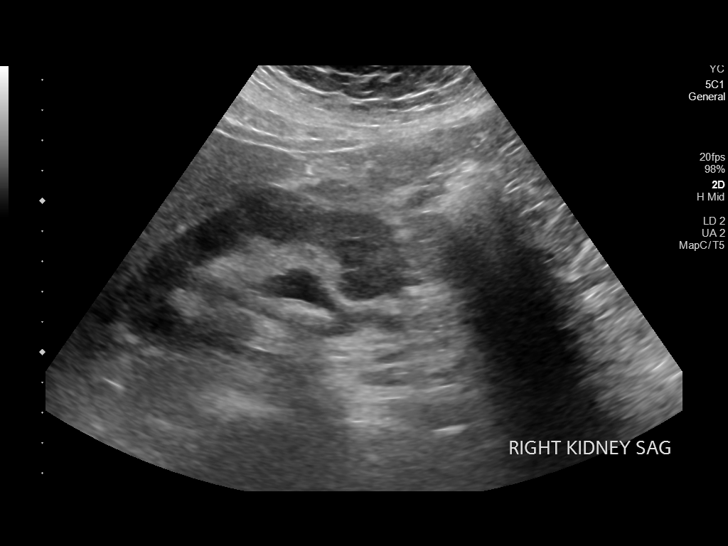
[im 30/56]
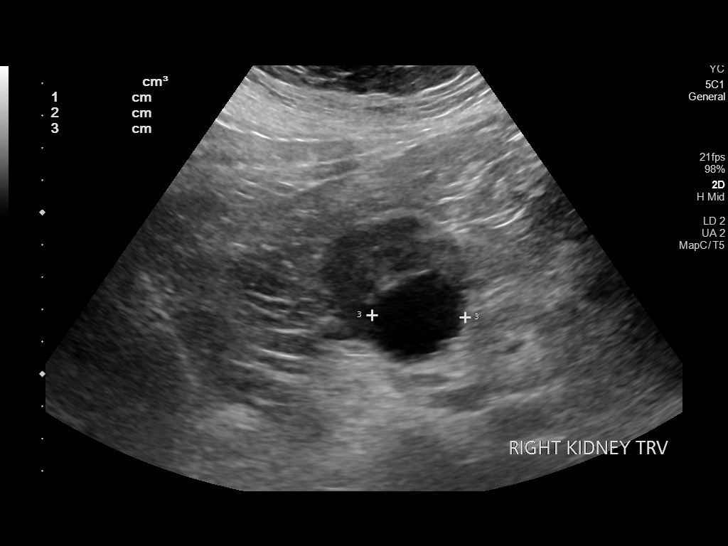
[im 35/56]
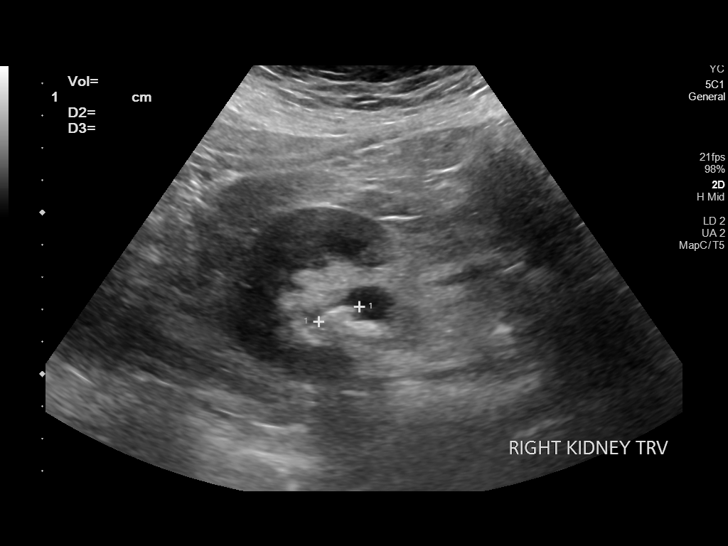
[im 37/56]
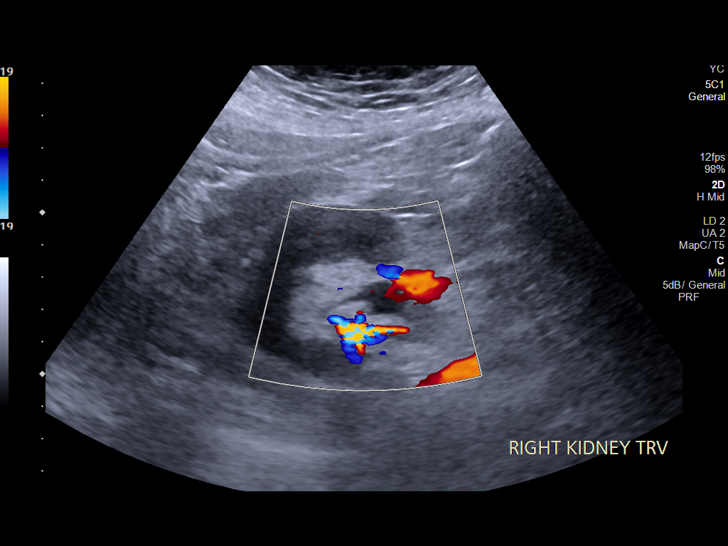
[im 42/56]
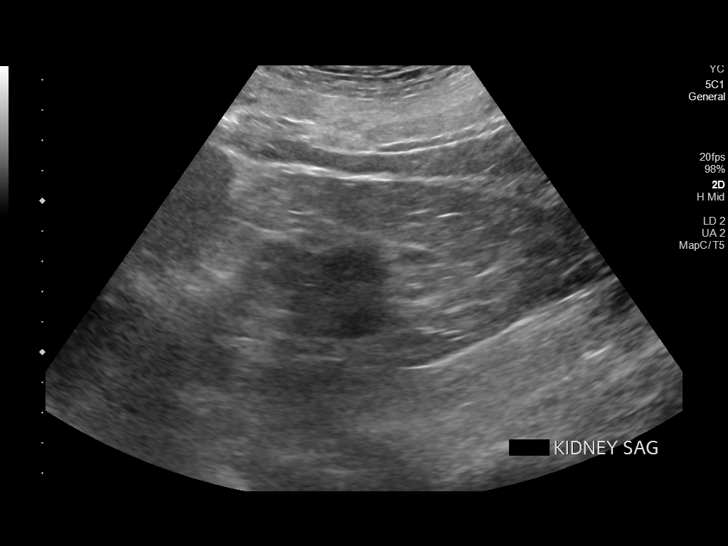
[im 46/56]
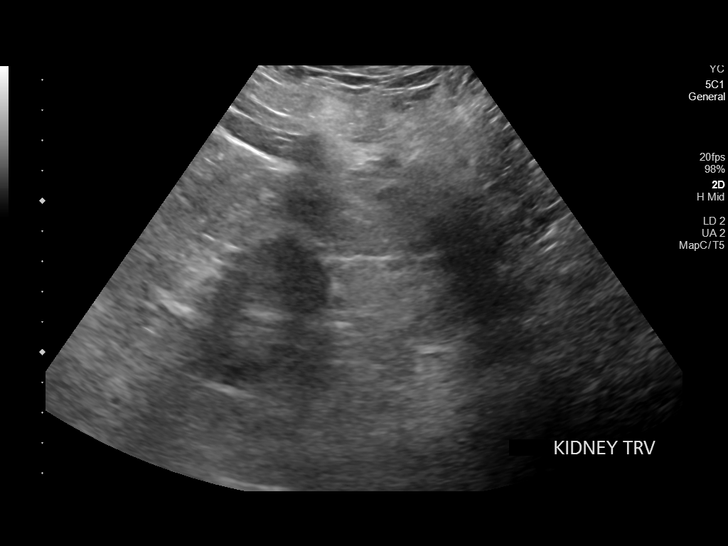
[im 51/56]
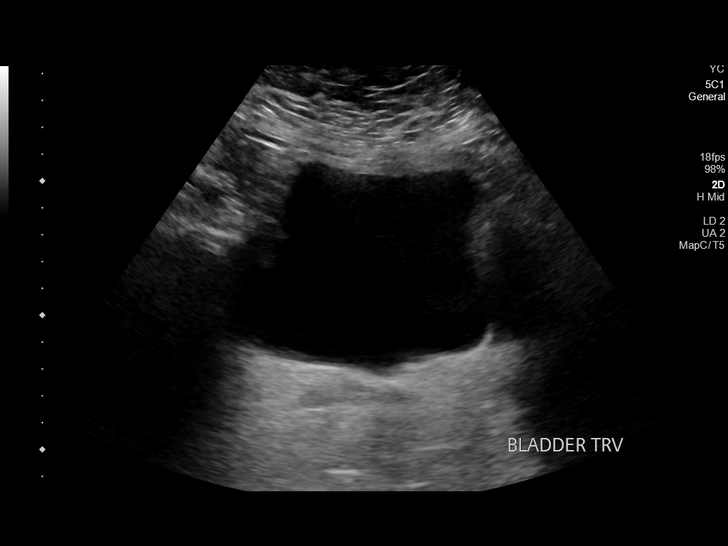
[im 56/56]
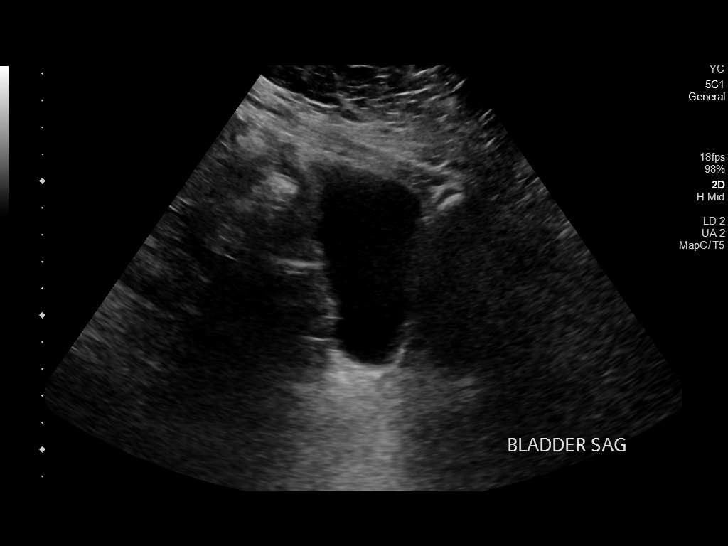

[14 of 25 positions shown; findings below may reference images not displayed]

FINDINGS: Right Kidney:

Renal measurements: 10.8 x 5.1 x 5.9 cm = volume: 169 mL. Mild right
hydronephrosis, similar to prior CT. There is a 15 mm renal pelvic
stone. Lower pole renal stones noted. Normal echotexture. 3.1 cm
lower pole cyst.

Left Kidney:

Renal measurements: 8.8 x 4.8 x 4.7 cm = volume: 104 mL.
Echogenicity within normal limits. No mass or hydronephrosis
visualized.

Bladder:

Appears normal for degree of bladder distention.

Other:

None
IMPRESSION: Right renal pelvic and lower pole stones. Mild right hydronephrosis.

## 2020-12-28 ENCOUNTER — Other Ambulatory Visit: Payer: Self-pay

## 2020-12-28 ENCOUNTER — Ambulatory Visit (INDEPENDENT_AMBULATORY_CARE_PROVIDER_SITE_OTHER)
Admission: RE | Admit: 2020-12-28 | Discharge: 2020-12-28 | Disposition: A | Discharge status: Home / Self Care | Payer: Medicare Other | Source: Ambulatory Visit | Attending: Primary Care | Admitting: Primary Care

## 2020-12-28 DIAGNOSIS — Z87891 Personal history of nicotine dependence: Secondary | ICD-10-CM

## 2020-12-28 DIAGNOSIS — J984 Other disorders of lung: Secondary | ICD-10-CM | POA: Diagnosis not present

## 2020-12-28 DIAGNOSIS — F17210 Nicotine dependence, cigarettes, uncomplicated: Secondary | ICD-10-CM | POA: Diagnosis not present

## 2020-12-28 DIAGNOSIS — J432 Centrilobular emphysema: Secondary | ICD-10-CM | POA: Diagnosis not present

## 2020-12-28 DIAGNOSIS — I708 Atherosclerosis of other arteries: Secondary | ICD-10-CM | POA: Diagnosis not present

## 2020-12-28 DIAGNOSIS — I2510 Atherosclerotic heart disease of native coronary artery without angina pectoris: Secondary | ICD-10-CM | POA: Diagnosis not present

## 2020-12-29 ENCOUNTER — Telehealth: Payer: Self-pay | Admitting: Acute Care

## 2020-12-29 DIAGNOSIS — F17210 Nicotine dependence, cigarettes, uncomplicated: Secondary | ICD-10-CM

## 2020-12-29 DIAGNOSIS — Z87891 Personal history of nicotine dependence: Secondary | ICD-10-CM

## 2020-12-29 NOTE — Telephone Encounter (Signed)
Spoke to [place] with [facility] Radiology and received a call report listed below:  IMPRESSION: 1. New 8.3 mm left lower lobe nodule. Lung-RADS 4B, suspicious. Additional imaging evaluation or consultation with Pulmonology or Thoracic Surgery recommended. These results will be called to the ordering clinician or representative by the Radiologist Assistant, and communication documented in the PACS or [employer]. 2. 1.5 cm thyroid isthmus nodule. Recommend thyroid US (ref: J Am Coll Radiol. 2015 Feb;12(2): 143-50). 3. Chronic calcific pancreatitis. 4. Aortic atherosclerosis (ICD10-I70.0). Coronary artery calcification. 5.  Emphysema (ICD10-J43.9).  Will forward to Eric Form ,NP

## 2020-12-29 NOTE — Telephone Encounter (Signed)
Will call patient today  through the screening program. Thanks Langley Gauss

## 2020-12-29 NOTE — Progress Notes (Signed)
I have called the patient with the results of her 6 month follow up low Dose CT. ( For LR 3) I explained that the scan was read as a Lung RADS 4 B indicates suspicious findings for which additional diagnostic testing and or tissue sampling is recommended. I have Dr. Valeta Harms review her scan. The nodule of concern 06/2019 was stable, however there was a new area noted in the LLL that is 8.3 mm. Dr. Valeta Harms agrees that a 3 month follow up to re-evaluate the area is appropriate.  Denise, please place order for 3 month follow up CT.  Pt will need a tele visit with me 1 week  prior to her 3 month follow up CT chest  appointment to ensure she is not sick. She will need a follow up with Dr. Valeta Harms after the follow up scan in one of his nodule slots.  Thanks so much

## 2021-01-01 NOTE — Telephone Encounter (Signed)
See results note 12/29/20. Results faxed to PCP. Order placed for 3 mth f/u nodule CT.

## 2021-01-03 DIAGNOSIS — E041 Nontoxic single thyroid nodule: Secondary | ICD-10-CM

## 2021-01-19 ENCOUNTER — Ambulatory Visit
Admission: RE | Admit: 2021-01-19 | Discharge: 2021-01-19 | Disposition: A | Discharge status: Home / Self Care | Payer: Medicare Other | Source: Ambulatory Visit | Attending: Internal Medicine | Admitting: Internal Medicine

## 2021-01-19 DIAGNOSIS — E041 Nontoxic single thyroid nodule: Secondary | ICD-10-CM | POA: Diagnosis not present

## 2021-02-01 ENCOUNTER — Ambulatory Visit: Payer: Medicare Other

## 2021-02-07 ENCOUNTER — Telehealth: Payer: Self-pay | Admitting: Primary Care

## 2021-02-07 NOTE — Telephone Encounter (Signed)
LVM for pt to rtn my call to r/s appt with nha. Please r/s appt if pt calls the office.

## 2021-02-23 ENCOUNTER — Ambulatory Visit: Payer: Medicare Other

## 2021-03-28 ENCOUNTER — Encounter: Payer: Self-pay | Admitting: Acute Care

## 2021-03-28 ENCOUNTER — Other Ambulatory Visit: Payer: Self-pay

## 2021-03-28 ENCOUNTER — Ambulatory Visit (INDEPENDENT_AMBULATORY_CARE_PROVIDER_SITE_OTHER): Payer: Medicare Other | Admitting: Acute Care

## 2021-03-28 DIAGNOSIS — F17210 Nicotine dependence, cigarettes, uncomplicated: Secondary | ICD-10-CM | POA: Diagnosis not present

## 2021-03-28 NOTE — Progress Notes (Signed)
Virtual Visit via Telephone Note  I connected with Jessica [name] on 03/28/21 at 12:00 PM EDT by telephone and verified that I am speaking with the correct person using two identifiers.  Location: Patient: at home Provider: [facility], [place], [place], [address]    I discussed the limitations, risks, security and privacy concerns of performing an evaluation and management service by telephone and the availability of in person appointments. I also discussed with the patient that there may be a patient responsible charge related to this service. The patient expressed understanding and agreed to proceed.  70 year old current every day smoker with a 77 pack year smoking history. She is followed through the [facility].    History of Present Illness: Pt. Presents for follow up. She is followed through the lung cancer screening program. She had an abnormal low dose CT Chest 12/28/2020. Dr. Valeta Harms reviewed the scan with me and determined that the scan should be repeated in 3 months. Today's tele visit is to ensure she is healthy and not having a flare prior to her follow up scan which is scheduled for 04/03/2021.Marland Kitchen She states she is doing well. She states she does have some allergies and post nasal drip. She is taking Flonase which has helped. She has a cough with her post nasal drip. She states she has some clear drainage from her nose in the morning. She does have a small amount of phlegm with cough, but this is clear. No fever.   She did have a thyroid US done. She has 9 small nodules that meet criteria for annual follow up in April of  2023    Observations/Objective: Low Dose CT Chest 12/28/2020 Low-attenuation thyroid isthmus nodule measures 1.5 cm. No pathologically enlarged mediastinal or axillary lymph nodes. Hilar regions are difficult to evaluate without IV contrast. Esophagus is grossly unremarkable.   Lungs/Pleura: Biapical pleuroparenchymal scarring.  Centrilobular and paraseptal emphysema. Smoking related respiratory bronchiolitis. Peribronchial thickening with scattered mucoid impaction. There is a new 8.3 mm nodule in the left lower lobe (3/209). Other pulmonary nodules measure 5.1 mm or less in size and are unchanged. No pleural fluid. Airway is otherwise unremarkable.   Upper Abdomen: Visualized portions of the liver, gallbladder, adrenal glands, kidneys and spleen are unremarkable. Calcifications are seen in the head and uncinate process of the pancreas. Visualized portions of the stomach and bowel are grossly unremarkable. No upper abdominal adenopathy.   Musculoskeletal: Degenerative changes in the spine. No worrisome lytic or sclerotic lesions. Flowing anterior osteophytosis in the thoracic spine.   IMPRESSION: 1. New 8.3 mm left lower lobe nodule. Lung-RADS 4B, suspicious. Additional imaging evaluation or consultation with Pulmonology   US Thyroid 01/19/2021 IMPRESSION: 1. Multinodular goiter. 2. Largest nodules measure up to 1.6 cm in the isthmus. Multiple nodules meet criteria for 1 year follow-up.  Assessment and Plan: Currently at baseline No fever of discolored secretions Does not need treating prior to repeat CT Chest Plan Low Dose CT Chest 04/03/2021 We will call you with the results. Call if you need [place] sooner Continue Flonase per Dr. Carlis Abbott for allergies Follow up US of Thyroid 12/2021 Work on quitting smoking, as this is the single most powerful action you can take to decrease your risk  of lung cancer , stroke, heart disease and worsening pulmonary function.  I have spent 3 minutes counseling patient on smoking cessation this visit. We have reviewed the risks of continued smoking on his current health situation.  Patient verbalizes understanding that by  continuing to  smoking she is accepting of the  negative health consequences including worsening of COPD, risk of lung cancer , stroke and heart disease. She  verbalized understanding.     Follow Up Instructions: Low Dose CT Chest 04/03/2021 We will call you with the results   I discussed the assessment and treatment plan with the patient. The patient was provided an opportunity to ask questions and all were answered. The patient agreed with the plan and demonstrated an understanding of the instructions.   The patient was advised to call back or seek an in-person evaluation if the symptoms worsen or if the condition fails to improve as anticipated.  I spent 30  minutes dedicated to the care of this patient on the date of this encounter to include pre-visit review of records, non-face-to-face time Teacher, early years/pre) with the patient discussing conditions above, post visit ordering of testing, clinical documentation with the electronic health record, making appropriate referrals as documented, and communicating necessary information to the patient's healthcare team.   Magdalen Spatz, NP  03/28/2021

## 2021-03-28 NOTE — Patient Instructions (Addendum)
It was great to speak with you today Low Dose CT Chest 04/03/2021 We will call you with the results. Call if you need [place] sooner Continue Flonase per Dr. Carlis Abbott for allergies Follow up US thyroid 12/2021 Work on quitting smoking, as this is the single most powerful action you can take to decrease your risk  of lung cancer , stroke, heart disease and worsening pulmonary function. Please contact office for sooner follow up if symptoms do not improve or worsen or seek emergency care

## 2021-04-03 ENCOUNTER — Other Ambulatory Visit: Payer: Self-pay

## 2021-04-03 ENCOUNTER — Ambulatory Visit (INDEPENDENT_AMBULATORY_CARE_PROVIDER_SITE_OTHER)
Admission: RE | Admit: 2021-04-03 | Discharge: 2021-04-03 | Disposition: A | Discharge status: Home / Self Care | Payer: Medicare Other | Source: Ambulatory Visit | Attending: Interventional Cardiology | Admitting: Interventional Cardiology

## 2021-04-03 ENCOUNTER — Ambulatory Visit: Payer: Medicare Other

## 2021-04-03 DIAGNOSIS — Z87891 Personal history of nicotine dependence: Secondary | ICD-10-CM

## 2021-04-03 DIAGNOSIS — R911 Solitary pulmonary nodule: Secondary | ICD-10-CM | POA: Diagnosis not present

## 2021-04-03 DIAGNOSIS — J439 Emphysema, unspecified: Secondary | ICD-10-CM | POA: Diagnosis not present

## 2021-04-03 DIAGNOSIS — F17210 Nicotine dependence, cigarettes, uncomplicated: Secondary | ICD-10-CM

## 2021-04-03 DIAGNOSIS — I700 Atherosclerosis of aorta: Secondary | ICD-10-CM | POA: Diagnosis not present

## 2021-04-05 ENCOUNTER — Other Ambulatory Visit: Payer: Self-pay | Admitting: *Deleted

## 2021-04-05 DIAGNOSIS — F17210 Nicotine dependence, cigarettes, uncomplicated: Secondary | ICD-10-CM

## 2021-04-05 DIAGNOSIS — Z87891 Personal history of nicotine dependence: Secondary | ICD-10-CM

## 2021-04-05 NOTE — Progress Notes (Signed)
Please call patient and let them  know their  low dose Ct was read as a Lung RADS 2: nodules that are benign in appearance and behavior with a very low likelihood of becoming a clinically active cancer due to size or lack of growth. Recommendation per radiology is for a repeat LDCT in 12 months. .Please let them  know we will order and schedule their  annual screening scan for 03/2022. Please let them  know there was notation of CAD on their  scan.  Please remind the patient  that this is a non-gated exam therefore degree or severity of disease  cannot be determined. Please have them  follow up with their PCP regarding potential risk factor modification, dietary therapy or pharmacologic therapy if clinically indicated. Pt.  is  currently on statin therapy. Please place order for annual  screening scan for  03/2022 and fax results to PCP. Thanks so much.

## 2021-04-12 ENCOUNTER — Ambulatory Visit (INDEPENDENT_AMBULATORY_CARE_PROVIDER_SITE_OTHER): Payer: Medicare Other

## 2021-04-12 DIAGNOSIS — Z0000 Encounter for general adult medical examination without abnormal findings: Secondary | ICD-10-CM

## 2021-04-12 NOTE — Progress Notes (Signed)
Subjective:   Jessica [name] is a 70 y.o. female who presents for Medicare Annual (Subsequent) preventive examination.  Review of Systems: N/A      I connected with the patient today by telephone and verified that I am speaking with the correct person using two identifiers. Location patient: home Location nurse: work Persons participating in the telephone visit: patient, nurse.   I discussed the limitations, risks, security and privacy concerns of performing an evaluation and management service by telephone and the availability of in person appointments. I also discussed with the patient that there may be a patient responsible charge related to this service. The patient expressed understanding and verbally consented to this telephonic visit.        Cardiac Risk Factors include: advanced age (>62men, >39 women);dyslipidemia     Objective:    There were no vitals filed for this visit. There is no height or weight on file to calculate BMI.  Advanced Directives 04/12/2021 02/01/2020 08/17/2019 08/02/2019 08/01/2019 12/22/2015 12/08/2015  Does Patient Have a Medical Advance Directive? Yes Yes Yes Yes No Yes Yes  Type of Paramedic of [place];Living will [place];Living will Living will Living will - - Living will;Healthcare Power of Attorney  Does patient want to make changes to medical advance directive? - - No - Patient declined No - Patient declined - - -  Copy of [place] in Chart? No - copy requested No - copy requested - - - No - copy requested -  Would patient like information on creating a medical advance directive? - - No - Patient declined - Yes (ED - Information included in AVS) - -    Current Medications (verified) Outpatient Encounter Medications as of 04/12/2021  Medication Sig   fluticasone (FLONASE) 50 MCG/ACT nasal spray Place 1 spray into both nostrils 2 (two) times daily.   rosuvastatin (CRESTOR) 20 MG  tablet Take 1 tablet (20 mg total) by mouth every evening. For cholesterol.   No facility-administered encounter medications on file as of 04/12/2021.    Allergies (verified) Chantix [varenicline]   History: Past Medical History:  Diagnosis Date   Allergy    seasonal allergies   Arthrosis of right acromioclavicular joint 2015   Cataract    sx 2019   Chronic kidney disease    hx kidney stones   COPD (chronic obstructive pulmonary disease) (HCC)    infreq exac, continued tobacco abuse   Dyslipidemia    GERD (gastroesophageal reflux disease)    with certain foods   History of kidney stones    Hyperlipidemia    on meds   Osteoarthritis of knee    R knee   Seasonal allergies    Past Surgical History:  Procedure Laterality Date   [place]   CYSTOSCOPY/URETEROSCOPY/HOLMIUM LASER/STENT PLACEMENT Right 08/03/2019   Procedure: CYSTOSCOPY/URETEROSCOPY/STENT PLACEMENT;  Surgeon: Robley Fries, MD;  Location: [facility];  Service: Urology;  Laterality: Right;  90 MINS   CYSTOSCOPY/URETEROSCOPY/HOLMIUM LASER/STENT PLACEMENT Right 08/17/2019   Procedure: CYSTOSCOPY/URETEROSCOPY/HOLMIUM LASER/STENT EXCHANGED;  Surgeon: Robley Fries, MD;  Location: [facility];  Service: Urology;  Laterality: Right;   MOUTH SURGERY  2011   dental implants   right knee surgery  1988   "torn cartledge"   1998/2002   ROTATOR CUFF REPAIR Right 2015   [place] EXTRACTION  1970  Family History  Problem Relation Age of Onset   Hypertension Mother    Heart disease Mother    Colon polyps Mother 71   Multiple sclerosis Father    Breast cancer Maternal Aunt    Stroke Other    Hypertension Brother    Colon cancer Neg Hx    Esophageal cancer Neg Hx    Rectal cancer Neg Hx    Stomach cancer Neg Hx    Social History   Socioeconomic History   Marital status: Married    Spouse name: Not on file    Number of children: Not on file   Years of education: Not on file   Highest education level: Not on file  Occupational History   Not on file  Tobacco Use   Smoking status: Every Day    Packs/day: 1.00    Years: 51.00    Pack years: 51.00    Types: Cigarettes   Smokeless tobacco: Never   Tobacco comments:    Contemplation phase; 08-02-2019 "about 0.5 to 1 pack"   Vaping Use   Vaping Use: Never used  Substance and Sexual Activity   Alcohol use: Yes    Alcohol/week: 0.0 standard drinks    Comment: rarely   Drug use: No   Sexual activity: Not on file  Other Topics Concern   Not on file  Social History Narrative   Not on file   Social Determinants of Health   Financial Resource Strain: Low Risk    Difficulty of Paying Living Expenses: Not hard at all  Food Insecurity: No Food Insecurity   Worried About Charity fundraiser in the Last Year: Never true   [place] in the Last Year: Never true  Transportation Needs: No Transportation Needs   Lack of Transportation (Medical): No   Lack of Transportation (Non-Medical): No  Physical Activity: Inactive   Days of Exercise per Week: 0 days   Minutes of Exercise per Session: 0 min  Stress: No Stress Concern Present   Feeling of Stress : Not at all  Social Connections: Not on file    Tobacco Counseling Ready to quit: Not Answered Counseling given: Not Answered Tobacco comments: Contemplation phase; 08-02-2019 "about 0.5 to 1 pack"    Clinical Intake:  Pre-visit preparation completed: Yes  Pain : No/denies pain     Nutritional Risks: None Diabetes: No  How often do you need to have someone help you when you read instructions, pamphlets, or other written materials from your doctor or pharmacy?: 1 - Never  Diabetic: No Nutrition Risk Assessment:  Has the patient had any N/V/D within the last 2 months?  No  Does the patient have any non-healing wounds?  No  Has the patient had any unintentional weight loss or  weight gain?  No   Diabetes:  Is the patient diabetic?  No  If diabetic, was a CBG obtained today?   N/A Did the patient bring in their glucometer from home?   N/A How often do you monitor your CBG's? N/A.   Financial Strains and Diabetes Management:  Are you having any financial strains with the device, your supplies or your medication?  N/A .  Does the patient want to be seen by Chronic Care Management for management of their diabetes?   N/A Would the patient like to be referred to a Nutritionist or for Diabetic Management?   N/A    Interpreter Needed?: No  Information entered by :: CJohnson, RN  Activities of Daily Living In your present state of health, do you have any difficulty performing the following activities: 04/12/2021  Hearing? N  Vision? N  Difficulty concentrating or making decisions? N  Walking or climbing stairs? N  Dressing or bathing? N  Doing errands, shopping? N  Preparing Food and eating ? N  Using the Toilet? N  In the past six months, have you accidently leaked urine? N  Do you have problems with loss of bowel control? N  Managing your Medications? N  Managing your Finances? N  Housekeeping or managing your Housekeeping? N  Some recent data might be hidden    Patient Care Team: Pleas Koch, NP as PCP - General (Internal Medicine)  Indicate any recent Medical Services you may have received from other than [facility] providers in the past year (date may be approximate).     Assessment:   This is a routine wellness examination for [facility].  Hearing/Vision screen Vision Screening - Comments:: Patient gets annual eye exams   Dietary issues and exercise activities discussed: Current Exercise Habits: The patient does not participate in regular exercise at present, Exercise limited by: None identified   Goals Addressed             This Visit's Progress    Patient Stated       04/12/2021, I will maintain and continue medications as  prescribed.        Depression Screen PHQ 2/9 Scores 04/12/2021 02/01/2020 04/20/2018 12/08/2015 04/16/2013  PHQ - 2 Score 0 0 0 0 0  PHQ- 9 Score 0 0 - - -    Fall Risk Fall Risk  04/12/2021 02/01/2020 12/08/2015 04/16/2013  Falls in the past year? 0 0 No No  Number falls in past yr: 0 0 - -  Injury with Fall? 0 0 - -  Risk for fall due to : No Fall Risks No Fall Risks - -  Follow up Falls evaluation completed;Falls prevention discussed Falls evaluation completed;Falls prevention discussed - -    FALL RISK PREVENTION PERTAINING TO THE HOME:  Any stairs in or around the home? Yes  If so, are there any without handrails? No  Home free of loose throw rugs in walkways, pet beds, electrical cords, etc? Yes  Adequate lighting in your home to reduce risk of falls? Yes   ASSISTIVE DEVICES UTILIZED TO PREVENT FALLS:  Life alert? No  Use of a cane, walker or w/c? No  Grab bars in the bathroom? No  Shower chair or bench in shower? No  Elevated toilet seat or a handicapped toilet? No   TIMED UP AND GO:  Was the test performed?  N/A telephone visit .    Cognitive Function: MMSE - Mini Mental State Exam 04/12/2021 02/01/2020  Orientation to time 5 5  Orientation to Place 5 5  Registration 3 3  Attention/ Calculation 5 5  Recall 3 3  Language- repeat 1 1  Mini Cog  Mini-Cog screen was completed. Maximum score is 22. A value of 0 denotes this part of the MMSE was not completed or the patient failed this part of the Mini-Cog screening.       Immunizations Immunization History  Administered Date(s) Administered   Influenza Split 07/27/2012   Influenza, High Dose Seasonal PF 07/28/2018, 07/17/2019   Influenza-Unspecified 07/17/2019   PFIZER(Purple Top)SARS-COV-2 Vaccination 12/12/2019, 01/03/2020, 09/29/2020   Pneumococcal Conjugate-13 09/29/2015   Pneumococcal Polysaccharide-23 02/21/2012, 04/21/2020   Td 09/30/2001   Tdap 02/21/2012  Zoster, Live 02/09/2013    TDAP status: Up to  date  Flu Vaccine status: due Fall 2022  Pneumococcal vaccine status: Up to date  Covid-19 vaccine status: Completed 3 vaccines  Qualifies for Shingles Vaccine? Yes   Zostavax completed Yes   Shingrix Completed?: No.    Education has been provided regarding the importance of this vaccine. Patient has been advised to call insurance company to determine out of pocket expense if they have not yet received this vaccine. Advised may also receive vaccine at local pharmacy or Health Dept. Verbalized acceptance and understanding.  Screening Tests Health Maintenance  Topic Date Due   Zoster Vaccines- Shingrix (1 of 2) Never done   Fecal DNA (Cologuard)  10/20/2018   COVID-19 Vaccine (4 - Booster for Pfizer series) 01/27/2021   INFLUENZA VACCINE  04/30/2021   TETANUS/TDAP  02/20/2022   MAMMOGRAM  05/03/2022   DEXA SCAN  Completed   Hepatitis C Screening  Completed   PNA vac Low Risk Adult  Completed   HPV VACCINES  Aged Out    Health Maintenance  Health Maintenance Due  Topic Date Due   Zoster Vaccines- Shingrix (1 of 2) Never done   Fecal DNA (Cologuard)  10/20/2018   COVID-19 Vaccine (4 - Booster for [place] series) 01/27/2021    Colorectal cancer screening: due, completed 06/14/2020, GI doctor recommend repeat in 6 months. Will discuss with provider at physical.   Mammogram status: Completed 05/03/2020. Repeat every year  Bone Density status: Completed 04/28/2019. Results reflect: Bone density results: OSTEOPENIA. Repeat every 2 years.  Lung Cancer Screening: (Low Dose CT Chest recommended if Age 12-80 years, 30 pack-year currently smoking OR have quit w/in 15years.) does qualify.   Lung Cancer Screening Referral: completed 04/03/2021  Additional Screening:  Hepatitis C Screening: does qualify; Completed 10/16/2015  Vision Screening: Recommended annual ophthalmology exams for early detection of glaucoma and other disorders of the eye. Is the patient up to date with their annual  eye exam?  Yes  Who is the provider or what is the name of the office in which the patient attends annual eye exams? [facility]  If pt is not established with a provider, would they like to be referred to a provider to establish care? No .   Dental Screening: Recommended annual dental exams for proper oral hygiene  Community Resource Referral / Chronic Care Management: CRR required this visit?  No   CCM required this visit?  No      Plan:     I have personally reviewed and noted the following in the patient's chart:   Medical and social history Use of alcohol, tobacco or illicit drugs  Current medications and supplements including opioid prescriptions.  Functional ability and status Nutritional status Physical activity Advanced directives List of other physicians Hospitalizations, surgeries, and ER visits in previous 12 months Vitals Screenings to include cognitive, depression, and falls Referrals and appointments  In addition, I have reviewed and discussed with patient certain preventive protocols, quality metrics, and best practice recommendations. A written personalized care plan for preventive services as well as general preventive health recommendations were provided to patient.   Due to this being a telephonic visit, the after visit summary with patients personalized plan was offered to patient via office or my-chart. Patient preferred to pick up at office at next visit or via mychart.   Andrez Grime, LPN   9/37/[id number]

## 2021-04-12 NOTE — Patient Instructions (Signed)
Jessica [name] , Thank you for taking time to come for your Medicare Wellness Visit. I appreciate your ongoing commitment to your health goals. Please review the following plan we discussed and let me know if I can assist you in the future.   Screening recommendations/referrals: Colonoscopy: due, completed 06/14/2020, GI doctor recommend repeat in 6 months. Will discuss with provider at physical.  Mammogram: Up to date, completed 05/03/2020, due 04/2021 Bone Density: Up to date, completed 04/28/2019, due 04/27/2021 Recommended yearly ophthalmology/optometry visit for glaucoma screening and checkup Recommended yearly dental visit for hygiene and checkup  Vaccinations: Influenza vaccine: due Fall 2022 Pneumococcal vaccine: Completed series Tdap vaccine: Up to date, completed 02/21/2012, due 01/2022 Shingles vaccine: due, check with your insurance regarding coverage if interested    Covid-19:completed 3 vaccines   Advanced directives: Please bring a copy of your POA (Power of Attorney) and/or Living Will to your next appointment.   Conditions/risks identified: dyslipidemia   Next appointment: Follow up in one year for your annual wellness visit    Preventive Care 72 Years and Older, Female Preventive care refers to lifestyle choices and visits with your health care provider that can promote health and wellness. What does preventive care include? A yearly physical exam. This is also called an annual well check. Dental exams once or twice a year. Routine eye exams. Ask your health care provider how often you should have your eyes checked. Personal lifestyle choices, including: Daily care of your teeth and gums. Regular physical activity. Eating a healthy diet. Avoiding tobacco and drug use. Limiting alcohol use. Practicing safe sex. Taking low-dose aspirin every day. Taking vitamin and mineral supplements as recommended by your health care provider. What happens during an annual well  check? The services and screenings done by your health care provider during your annual well check will depend on your age, overall health, lifestyle risk factors, and family history of disease. Counseling  Your health care provider may ask you questions about your: Alcohol use. Tobacco use. Drug use. Emotional well-being. Home and relationship well-being. Sexual activity. Eating habits. History of falls. Memory and ability to understand (cognition). Work and work Statistician. Reproductive health. Screening  You may have the following tests or measurements: Height, weight, and BMI. Blood pressure. Lipid and cholesterol levels. These may be checked every 5 years, or more frequently if you are over 72 years old. Skin check. Lung cancer screening. You may have this screening every year starting at age 72 if you have a 30-pack-year history of smoking and currently smoke or have quit within the past 15 years. Fecal occult blood test (FOBT) of the stool. You may have this test every year starting at age 72. Flexible sigmoidoscopy or colonoscopy. You may have a sigmoidoscopy every 5 years or a colonoscopy every 10 years starting at age 72. Hepatitis C blood test. Hepatitis B blood test. Sexually transmitted disease (STD) testing. Diabetes screening. This is done by checking your blood sugar (glucose) after you have not eaten for a while (fasting). You may have this done every 1-3 years. Bone density scan. This is done to screen for osteoporosis. You may have this done starting at age 72. Mammogram. This may be done every 1-2 years. Talk to your health care provider about how often you should have regular mammograms. Talk with your health care provider about your test results, treatment options, and if necessary, the need for more tests. Vaccines  Your health care provider may recommend certain vaccines, such as: Influenza  vaccine. This is recommended every year. Tetanus, diphtheria, and  acellular pertussis (Tdap, Td) vaccine. You may need a Td booster every 10 years. Zoster vaccine. You may need this after age 18. Pneumococcal 13-valent conjugate (PCV13) vaccine. One dose is recommended after age 41. Pneumococcal polysaccharide (PPSV23) vaccine. One dose is recommended after age 55. Talk to your health care provider about which screenings and vaccines you need and how often you need them. This information is not intended to replace advice given to you by your health care provider. Make sure you discuss any questions you have with your health care provider. Document Released: 10/13/2015 Document Revised: 06/05/2016 Document Reviewed: 07/18/2015 Elsevier Interactive Patient Education  2017 [place] Prevention in the Home Falls can cause injuries. They can happen to people of all ages. There are many things you can do to make your home safe and to help prevent falls. What can I do on the outside of my home? Regularly fix the edges of walkways and driveways and fix any cracks. Remove anything that might make you trip as you walk through a door, such as a raised step or threshold. Trim any bushes or trees on the path to your home. Use bright outdoor lighting. Clear any walking paths of anything that might make someone trip, such as rocks or tools. Regularly check to see if handrails are loose or broken. Make sure that both sides of any steps have handrails. Any raised decks and porches should have guardrails on the edges. Have any leaves, snow, or ice cleared regularly. Use sand or salt on walking paths during winter. Clean up any spills in your garage right away. This includes oil or grease spills. What can I do in the bathroom? Use night lights. Install grab bars by the toilet and in the tub and shower. Do not use towel bars as grab bars. Use non-skid mats or decals in the tub or shower. If you need to sit down in the shower, use a plastic, non-slip stool. Keep  the floor dry. Clean up any water that spills on the floor as soon as it happens. Remove soap buildup in the tub or shower regularly. Attach bath mats securely with double-sided non-slip rug tape. Do not have throw rugs and other things on the floor that can make you trip. What can I do in the bedroom? Use night lights. Make sure that you have a light by your bed that is easy to reach. Do not use any sheets or blankets that are too big for your bed. They should not hang down onto the floor. Have a firm chair that has side arms. You can use this for support while you get dressed. Do not have throw rugs and other things on the floor that can make you trip. What can I do in the kitchen? Clean up any spills right away. Avoid walking on wet floors. Keep items that you use a lot in easy-to-reach places. If you need to reach something above you, use a strong step stool that has a grab bar. Keep electrical cords out of the way. Do not use floor polish or wax that makes floors slippery. If you must use wax, use non-skid floor wax. Do not have throw rugs and other things on the floor that can make you trip. What can I do with my stairs? Do not leave any items on the stairs. Make sure that there are handrails on both sides of the stairs and use them. Fix  handrails that are broken or loose. Make sure that handrails are as long as the stairways. Check any carpeting to make sure that it is firmly attached to the stairs. Fix any carpet that is loose or worn. Avoid having throw rugs at the top or bottom of the stairs. If you do have throw rugs, attach them to the floor with carpet tape. Make sure that you have a light switch at the top of the stairs and the bottom of the stairs. If you do not have them, ask someone to add them for you. What else can I do to help prevent falls? Wear shoes that: Do not have high heels. Have rubber bottoms. Are comfortable and fit you well. Are closed at the toe. Do not  wear sandals. If you use a stepladder: Make sure that it is fully opened. Do not climb a closed stepladder. Make sure that both sides of the stepladder are locked into place. Ask someone to hold it for you, if possible. Clearly mark and make sure that you can see: Any grab bars or handrails. First and last steps. Where the edge of each step is. Use tools that help you move around (mobility aids) if they are needed. These include: Canes. Walkers. Scooters. Crutches. Turn on the lights when you go into a dark area. Replace any light bulbs as soon as they burn out. Set up your furniture so you have a clear path. Avoid moving your furniture around. If any of your floors are uneven, fix them. If there are any pets around you, be aware of where they are. Review your medicines with your doctor. Some medicines can make you feel dizzy. This can increase your chance of falling. Ask your doctor what other things that you can do to help prevent falls. This information is not intended to replace advice given to you by your health care provider. Make sure you discuss any questions you have with your health care provider. Document Released: 07/13/2009 Document Revised: 02/22/2016 Document Reviewed: 10/21/2014 Elsevier Interactive Patient Education  2017 [employer].

## 2021-04-12 NOTE — Progress Notes (Signed)
PCP notes:  Health Maintenance: Colonoscopy- due Shingrix- due    Abnormal Screenings: none   Patient concerns: none   Nurse concerns: none   Next PCP appt.: none

## 2021-05-09 DIAGNOSIS — M8589 Other specified disorders of bone density and structure, multiple sites: Secondary | ICD-10-CM | POA: Diagnosis not present

## 2021-05-09 DIAGNOSIS — Z780 Asymptomatic menopausal state: Secondary | ICD-10-CM | POA: Diagnosis not present

## 2021-05-09 DIAGNOSIS — Z1231 Encounter for screening mammogram for malignant neoplasm of breast: Secondary | ICD-10-CM | POA: Diagnosis not present

## 2021-05-09 LAB — HM MAMMOGRAPHY

## 2021-05-09 LAB — HM DEXA SCAN

## 2021-05-10 ENCOUNTER — Other Ambulatory Visit: Payer: Self-pay | Admitting: Primary Care

## 2021-05-10 DIAGNOSIS — E785 Hyperlipidemia, unspecified: Secondary | ICD-10-CM

## 2021-05-10 DIAGNOSIS — I700 Atherosclerosis of aorta: Secondary | ICD-10-CM

## 2021-05-11 NOTE — Telephone Encounter (Signed)
Patient has office visit next month 30 day sent in.

## 2021-05-14 ENCOUNTER — Encounter: Payer: Self-pay | Admitting: Primary Care

## 2021-05-15 ENCOUNTER — Encounter: Payer: Self-pay | Admitting: Primary Care

## 2021-06-06 ENCOUNTER — Encounter: Payer: Self-pay | Admitting: Primary Care

## 2021-06-06 ENCOUNTER — Other Ambulatory Visit: Payer: Self-pay

## 2021-06-06 ENCOUNTER — Ambulatory Visit (INDEPENDENT_AMBULATORY_CARE_PROVIDER_SITE_OTHER): Payer: Medicare Other | Admitting: Primary Care

## 2021-06-06 VITALS — BP 118/72 | HR 72 | Temp 98.3°F | Ht 62.0 in | Wt 136.0 lb

## 2021-06-06 DIAGNOSIS — D126 Benign neoplasm of colon, unspecified: Secondary | ICD-10-CM | POA: Diagnosis not present

## 2021-06-06 DIAGNOSIS — E042 Nontoxic multinodular goiter: Secondary | ICD-10-CM | POA: Diagnosis not present

## 2021-06-06 DIAGNOSIS — Z0000 Encounter for general adult medical examination without abnormal findings: Secondary | ICD-10-CM | POA: Diagnosis not present

## 2021-06-06 DIAGNOSIS — K219 Gastro-esophageal reflux disease without esophagitis: Secondary | ICD-10-CM | POA: Diagnosis not present

## 2021-06-06 DIAGNOSIS — Z1211 Encounter for screening for malignant neoplasm of colon: Secondary | ICD-10-CM

## 2021-06-06 DIAGNOSIS — M810 Age-related osteoporosis without current pathological fracture: Secondary | ICD-10-CM | POA: Diagnosis not present

## 2021-06-06 DIAGNOSIS — J439 Emphysema, unspecified: Secondary | ICD-10-CM | POA: Diagnosis not present

## 2021-06-06 DIAGNOSIS — E785 Hyperlipidemia, unspecified: Secondary | ICD-10-CM

## 2021-06-06 LAB — COMPREHENSIVE METABOLIC PANEL
ALT: 12 U/L (ref 0–35)
AST: 15 U/L (ref 0–37)
Albumin: 4.5 g/dL (ref 3.5–5.2)
Alkaline Phosphatase: 74 U/L (ref 39–117)
BUN: 23 mg/dL (ref 6–23)
CO2: 29 mEq/L (ref 19–32)
Calcium: 9.5 mg/dL (ref 8.4–10.5)
Chloride: 102 mEq/L (ref 96–112)
Creatinine, Ser: 1.11 mg/dL (ref 0.40–1.20)
GFR: 49.8 mL/min — ABNORMAL LOW (ref >60.00)
Glucose, Bld: 89 mg/dL (ref 70–99)
Potassium: 4.6 mEq/L (ref 3.5–5.1)
Sodium: 141 mEq/L (ref 135–145)
Total Bilirubin: 0.5 mg/dL (ref 0.2–1.2)
Total Protein: 7.6 g/dL (ref 6.0–8.3)

## 2021-06-06 LAB — LIPID PANEL
Cholesterol: 160 mg/dL (ref 0–200)
HDL: 48.1 mg/dL (ref >39.00)
LDL Cholesterol: 80 mg/dL (ref 0–99)
NonHDL: 112.28
Total CHOL/HDL Ratio: 3
Triglycerides: 160 mg/dL — ABNORMAL HIGH (ref 0.0–149.0)
VLDL: 32 mg/dL (ref 0.0–40.0)

## 2021-06-06 LAB — CBC
HCT: 45.1 % (ref 36.0–46.0)
Hemoglobin: 14.5 g/dL (ref 12.0–15.0)
MCHC: 32.2 g/dL (ref 30.0–36.0)
MCV: 90.7 fl (ref 78.0–100.0)
Platelets: 202 10*3/uL (ref 150.0–400.0)
RBC: 4.97 Mil/uL (ref 3.87–5.11)
RDW: 13.7 % (ref 11.5–15.5)
WBC: 7.3 10*3/uL (ref 4.0–10.5)

## 2021-06-06 LAB — TSH: TSH: 1.65 u[IU]/mL (ref 0.35–5.50)

## 2021-06-06 NOTE — Assessment & Plan Note (Signed)
Infrequent symptoms, no recent OTC treatment.

## 2021-06-06 NOTE — Assessment & Plan Note (Signed)
Not taking calcium and vitamin D, discussed to start taking. Could not tolerate alendronate previously, was never connected for Prolia.   Recent bone density scan reviewed with patient, improved and is now considered osteopenic.   Continue regular activity.

## 2021-06-06 NOTE — Assessment & Plan Note (Signed)
Noted on ultrasound from April 2022. Due for repeat ultrasound in April 2023, discussed with patient today.

## 2021-06-06 NOTE — Assessment & Plan Note (Signed)
Overdue for repeat colonoscopy, discussed with patient today. Referral placed back to GI for repeat colonoscopy.

## 2021-06-06 NOTE — Progress Notes (Signed)
Subjective:    Patient ID: Jessica [name], female    DOB: [birth year], 70 y.o.   MRN: [id number]  HPI  Jessica [name] is a very pleasant 70 y.o. female who presents today for complete physical and follow up of chronic conditions.  Immunizations: -Tetanus: 2013 -Influenza: Due this season  -Covid-19: 3 vaccines -Shingles: Zostavax -Pneumonia: Prevnar 13 2016, Pneumovax 2021   Diet: Pelzer.  Exercise: No regular exercise.  Eye exam: Completes annually, needs to schedule.  Dental exam: Completes semi-annually   Mammogram: Completed in August 2022 Dexa: Completed in 2022 Colonoscopy: Completed Cologuard in 2017, colonoscopy in 2021, overdue.   Lung Cancer Screening: Completed in July 2022   BP Readings from Last 3 Encounters:  06/06/21 118/72  10/06/20 (!) 110/59  06/14/20 (!) 110/5      Review of Systems  Constitutional:  Negative for unexpected weight change.  HENT:  Negative for rhinorrhea.   Eyes:  Negative for visual disturbance.  Respiratory:  Negative for shortness of breath.   Cardiovascular:  Negative for chest pain.  Gastrointestinal:  Negative for constipation and diarrhea.  Genitourinary:  Negative for difficulty urinating.  Musculoskeletal:  Negative for arthralgias and myalgias.  Skin:  Negative for rash.  Allergic/Immunologic: Negative for environmental allergies.  Neurological:  Negative for dizziness and headaches.  Psychiatric/Behavioral:  The patient is not nervous/anxious.         Past Medical History:  Diagnosis Date   Allergy    seasonal allergies   Arthrosis of right acromioclavicular joint 2015   Cataract    sx 2019   Chronic kidney disease    hx kidney stones   COPD (chronic obstructive pulmonary disease) (HCC)    infreq exac, continued tobacco abuse   Dyslipidemia    GERD (gastroesophageal reflux disease)    with certain foods   History of kidney stones    Hyperlipidemia    on meds   Osteoarthritis of knee    R  knee   Seasonal allergies     Social History   Socioeconomic History   Marital status: Married    Spouse name: Not on file   Number of children: Not on file   Years of education: Not on file   Highest education level: Not on file  Occupational History   Not on file  Tobacco Use   Smoking status: Every Day    Packs/day: 1.00    Years: 51.00    Pack years: 51.00    Types: Cigarettes   Smokeless tobacco: Never   Tobacco comments:    Contemplation phase; 08-02-2019 "about 0.5 to 1 pack"   Vaping Use   Vaping Use: Never used  Substance and Sexual Activity   Alcohol use: Yes    Alcohol/week: 0.0 standard drinks    Comment: rarely   Drug use: No   Sexual activity: Not on file  Other Topics Concern   Not on file  Social History Narrative   Not on file   Social Determinants of Health   Financial Resource Strain: Low Risk    Difficulty of Paying Living Expenses: Not hard at all  Food Insecurity: No Food Insecurity   Worried About Charity fundraiser in the Last Year: Never true   [place] in the Last Year: Never true  Transportation Needs: No Transportation Needs   Lack of Transportation (Medical): No   Lack of Transportation (Non-Medical): No  Physical Activity: Inactive   Days of Exercise per  Week: 0 days   Minutes of Exercise per Session: 0 min  Stress: No Stress Concern Present   Feeling of Stress : Not at all  Social Connections: Not on file  Intimate Partner Violence: Not At Risk   Fear of Current or Ex-Partner: No   Emotionally Abused: No   Physically Abused: No   Sexually Abused: No    Past Surgical History:  Procedure Laterality Date   [place]   CYSTOSCOPY/URETEROSCOPY/HOLMIUM LASER/STENT PLACEMENT Right 08/03/2019   Procedure: CYSTOSCOPY/URETEROSCOPY/STENT PLACEMENT;  Surgeon: Robley Fries, MD;  Location: [facility];  Service: Urology;  Laterality: Right;  90 MINS   CYSTOSCOPY/URETEROSCOPY/HOLMIUM  LASER/STENT PLACEMENT Right 08/17/2019   Procedure: CYSTOSCOPY/URETEROSCOPY/HOLMIUM LASER/STENT EXCHANGED;  Surgeon: Robley Fries, MD;  Location: [facility];  Service: Urology;  Laterality: Right;   MOUTH SURGERY  2011   dental implants   right knee surgery  1988   "torn cartledge"   1998/2002   ROTATOR CUFF REPAIR Right 2015   TONSILLECTOMY  1956   TUBAL LIGATION  1980   WISDOM TOOTH EXTRACTION  1970    Family History  Problem Relation Age of Onset   Hypertension Mother    Heart disease Mother    Colon polyps Mother 27   Multiple sclerosis Father    Breast cancer Maternal Aunt    Stroke Other    Hypertension Brother    Colon cancer Neg Hx    Esophageal cancer Neg Hx    Rectal cancer Neg Hx    Stomach cancer Neg Hx     Allergies  Allergen Reactions   Chantix [Varenicline] Nausea And Vomiting    Dizzy, nausea only with the higher dose    Current Outpatient Medications on File Prior to Visit  Medication Sig Dispense Refill   fluticasone (FLONASE) 50 MCG/ACT nasal spray Place 1 spray into both nostrils 2 (two) times daily. 16 g 5   rosuvastatin (CRESTOR) 20 MG tablet TAKE 1 TABLET BY MOUTH ONCE DAILY IN THE EVENING FOR  CHOLESTEROL 30 tablet 0   No current facility-administered medications on file prior to visit.    BP 118/72   Pulse 72   Temp 98.3 F (36.8 C) (Temporal)   Ht 5\' 2"  (1.575 m)   Wt 136 lb (61.7 kg)   SpO2 97%   BMI 24.87 kg/m  Objective:   Physical Exam HENT:     Right Ear: Tympanic membrane and ear canal normal.     Left Ear: Tympanic membrane and ear canal normal.     Nose: Nose normal.  Eyes:     Conjunctiva/sclera: Conjunctivae normal.     Pupils: Pupils are equal, round, and reactive to light.  Neck:     Thyroid: No thyromegaly.  Cardiovascular:     Rate and Rhythm: Normal rate and regular rhythm.     Heart sounds: No murmur heard. Pulmonary:     Effort: Pulmonary effort is normal.     Breath sounds: Normal breath  sounds. No rales.  Abdominal:     General: Bowel sounds are normal.     Palpations: Abdomen is soft.     Tenderness: There is no abdominal tenderness.  Musculoskeletal:        General: Normal range of motion.     Cervical back: Neck supple.  Lymphadenopathy:     Cervical: No cervical adenopathy.  Skin:    General: Skin is warm and dry.  Findings: No rash.  Neurological:     Mental Status: She is alert and oriented to person, place, and time.     Cranial Nerves: No cranial nerve deficit.     Deep Tendon Reflexes: Reflexes are normal and symmetric.  Psychiatric:        Mood and Affect: Mood normal.          Assessment & Plan:      This visit occurred during the SARS-CoV-2 public health emergency.  Safety protocols were in place, including screening questions prior to the visit, additional usage of staff PPE, and extensive cleaning of exam room while observing appropriate contact time as indicated for disinfecting solutions.

## 2021-06-06 NOTE — Assessment & Plan Note (Addendum)
Declines influenza vaccine today, other vaccines UTD. Bone density and mammogram UTD. Colonoscopy overdue, referral placed back to GI. Lung cancer screening UTD.  Discussed the importance of a healthy diet and regular exercise in order for weight loss, and to reduce the risk of further co-morbidity.  Exam today stable. Labs pending.

## 2021-06-06 NOTE — Assessment & Plan Note (Signed)
Compliant to rosuvastatin 20 mg, continue same. Repeat lipid panel pending.

## 2021-06-06 NOTE — Assessment & Plan Note (Signed)
Stable, denies concerns. Lung cancer screening UTD, due in Summer 2023.

## 2021-06-06 NOTE — Patient Instructions (Signed)
Stop by the lab prior to leaving today. I will notify you of your results once received.   You will be contacted regarding your referral to GI for the colonoscopy.  Please let us know if you have not been contacted within two weeks.   I recommend you start taking Calcium 1200 mg with Vitamin D 800 units everyday for bone density. Weight bearing exercise is also very important in bone strength, make sure to exercise regularly.   You are due for repeat thyroid ultrasound in April 2023.  It was a pleasure to see you today!  Preventive Care 50 Years and Older, Female Preventive care refers to lifestyle choices and visits with your health care provider that can promote health and wellness. This includes: A yearly physical exam. This is also called an annual wellness visit. Regular dental and eye exams. Immunizations. Screening for certain conditions. Healthy lifestyle choices, such as: Eating a healthy diet. Getting regular exercise. Not using drugs or products that contain nicotine and tobacco. Limiting alcohol use. What can I expect for my preventive care visit? Physical exam Your health care provider will check your: Height and weight. These may be used to calculate your BMI (body mass index). BMI is a measurement that tells if you are at a healthy weight. Heart rate and blood pressure. Body temperature. Skin for abnormal spots. Counseling Your health care provider may ask you questions about your: Past medical problems. Family's medical history. Alcohol, tobacco, and drug use. Emotional well-being. Home life and relationship well-being. Sexual activity. Diet, exercise, and sleep habits. History of falls. Memory and ability to understand (cognition). Work and work Statistician. Pregnancy and menstrual history. Access to firearms. What immunizations do I need? Vaccines are usually given at various ages, according to a schedule. Your health care provider will recommend vaccines  for you based on your age, medical history, and lifestyle or other factors, such as travel or where you work. What tests do I need? Blood tests Lipid and cholesterol levels. These may be checked every 5 years, or more often depending on your overall health. Hepatitis C test. Hepatitis B test. Screening Lung cancer screening. You may have this screening every year starting at age 23 if you have a 30-pack-year history of smoking and currently smoke or have quit within the past 15 years. Colorectal cancer screening. All adults should have this screening starting at age 28 and continuing until age 22. Your health care provider may recommend screening at age 64 if you are at increased risk. You will have tests every 1-10 years, depending on your results and the type of screening test. Diabetes screening. This is done by checking your blood sugar (glucose) after you have not eaten for a while (fasting). You may have this done every 1-3 years. Mammogram. This may be done every 1-2 years. Talk with your health care provider about how often you should have regular mammograms. Abdominal aortic aneurysm (AAA) screening. You may need this if you are a current or former smoker. BRCA-related cancer screening. This may be done if you have a family history of breast, ovarian, tubal, or peritoneal cancers. Other tests STD (sexually transmitted disease) testing, if you are at risk. Bone density scan. This is done to screen for osteoporosis. You may have this done starting at age 52. Talk with your health care provider about your test results, treatment options, and if necessary, the need for more tests. Follow these instructions at home: Eating and drinking  Eat a diet that  includes fresh fruits and vegetables, whole grains, lean protein, and low-fat dairy products. Limit your intake of foods with high amounts of sugar, saturated fats, and salt. Take vitamin and mineral supplements as recommended by your  health care provider. Do not drink alcohol if your health care provider tells you not to drink. If you drink alcohol: Limit how much you have to 0-1 drink a day. Be aware of how much alcohol is in your drink. In the [place] one drink equals one 12 oz bottle of beer (355 mL), one 5 oz glass of wine (148 mL), or one 1 oz glass of hard liquor (44 mL). Lifestyle Take daily care of your teeth and gums. Brush your teeth every morning and night with fluoride toothpaste. Floss one time each day. Stay active. Exercise for at least 30 minutes 5 or more days each week. Do not use any products that contain nicotine or tobacco, such as cigarettes, e-cigarettes, and chewing tobacco. If you need help quitting, ask your health care provider. Do not use drugs. If you are sexually active, practice safe sex. Use a condom or other form of protection in order to prevent STIs (sexually transmitted infections). Talk with your health care provider about taking a low-dose aspirin or statin. Find healthy ways to cope with stress, such as: Meditation, yoga, or listening to music. Journaling. Talking to a trusted person. Spending time with friends and family. Safety Always wear your seat belt while driving or riding in a vehicle. Do not drive: If you have been drinking alcohol. Do not ride with someone who has been drinking. When you are tired or distracted. While texting. Wear a helmet and other protective equipment during sports activities. If you have firearms in your house, make sure you follow all gun safety procedures. What's next? Visit your health care provider once a year for an annual wellness visit. Ask your health care provider how often you should have your eyes and teeth checked. Stay up to date on all vaccines. This information is not intended to replace advice given to you by your health care provider. Make sure you discuss any questions you have with your health care provider. Document Revised:  11/24/2020 Document Reviewed: 09/10/2018 Elsevier Patient Education  2022 [employer].

## 2021-06-09 ENCOUNTER — Other Ambulatory Visit: Payer: Self-pay | Admitting: Primary Care

## 2021-06-09 DIAGNOSIS — E785 Hyperlipidemia, unspecified: Secondary | ICD-10-CM

## 2021-06-09 DIAGNOSIS — I700 Atherosclerosis of aorta: Secondary | ICD-10-CM

## 2021-06-14 MED ORDER — ROSUVASTATIN CALCIUM 20 MG PO TABS: ORAL_TABLET | ORAL | 3 refills | Status: DC

## 2021-06-14 NOTE — Addendum Note (Signed)
Addended by: Pleas Koch on: 06/14/2021 05:48 PM   Modules accepted: Orders

## 2021-06-19 NOTE — Addendum Note (Signed)
Addended by: Pleas Koch on: 06/19/2021 01:43 PM   Modules accepted: Orders

## 2021-08-13 ENCOUNTER — Encounter: Payer: Self-pay | Admitting: Gastroenterology

## 2021-09-03 ENCOUNTER — Other Ambulatory Visit (INDEPENDENT_AMBULATORY_CARE_PROVIDER_SITE_OTHER): Payer: Medicare Other

## 2021-09-03 ENCOUNTER — Other Ambulatory Visit: Payer: Self-pay

## 2021-09-03 DIAGNOSIS — E785 Hyperlipidemia, unspecified: Secondary | ICD-10-CM

## 2021-09-03 DIAGNOSIS — I700 Atherosclerosis of aorta: Secondary | ICD-10-CM

## 2021-09-03 LAB — LIPID PANEL
Cholesterol: 156 mg/dL (ref 0–200)
HDL: 49.5 mg/dL
LDL Cholesterol: 70 mg/dL (ref 0–99)
NonHDL: 106.85
Total CHOL/HDL Ratio: 3
Triglycerides: 185 mg/dL — ABNORMAL HIGH (ref 0.0–149.0)
VLDL: 37 mg/dL (ref 0.0–40.0)

## 2021-11-23 ENCOUNTER — Encounter: Payer: Self-pay | Admitting: Gastroenterology

## 2022-01-01 ENCOUNTER — Other Ambulatory Visit: Payer: Self-pay

## 2022-01-01 ENCOUNTER — Ambulatory Visit (AMBULATORY_SURGERY_CENTER): Payer: Medicare Other | Admitting: *Deleted

## 2022-01-01 VITALS — Ht 62.0 in | Wt 136.0 lb

## 2022-01-01 DIAGNOSIS — Z86010 Personal history of colonic polyps: Secondary | ICD-10-CM

## 2022-01-01 MED ORDER — CLENPIQ 10-3.5-12 MG-GM -GM/160ML PO SOLN: 1.0000 | ORAL | 0 refills | Status: DC

## 2022-01-01 NOTE — Progress Notes (Signed)
No egg or soy allergy known to patient  ?No issues known to pt with past sedation with any surgeries or procedures ?Patient denies ever being told they had issues or difficulty with intubation  ?No FH of Malignant Hyperthermia ?Pt is not on diet pills ?Pt is not on  home 02  ?Pt is not on blood thinners  ?Pt denies issues with constipation  ?No A fib or A flutter ? ?clenpiq Coupon to pt in PV today , Code to Pharmacy and  NO PA's for preps discussed with pt In PV today  ?Discussed with pt there will be an out-of-pocket cost for prep and that varies from $0 to 70 +  dollars - pt verbalized understanding  ? ?Due to the COVID-19 pandemic we are asking patients to follow certain guidelines in PV and the [place]   ?Pt aware of COVID protocols and [facility] guidelines  ? ?PV completed over the phone. Pt verified name, DOB, address and insurance during PV today.  ?Pt mailed instruction packet with copy of consent form to read and not return, and instructions.  ?Pt encouraged to call with questions or issues.  ?If pt has My chart, procedure instructions sent via My Chart   ?

## 2022-01-03 ENCOUNTER — Encounter: Payer: Self-pay | Admitting: Gastroenterology

## 2022-01-15 ENCOUNTER — Ambulatory Visit (AMBULATORY_SURGERY_CENTER): Payer: Medicare Other | Admitting: Gastroenterology

## 2022-01-15 ENCOUNTER — Encounter: Payer: Self-pay | Admitting: Gastroenterology

## 2022-01-15 VITALS — BP 114/53 | HR 62 | Temp 97.3°F | Resp 22 | Ht 62.0 in | Wt 136.0 lb

## 2022-01-15 DIAGNOSIS — D122 Benign neoplasm of ascending colon: Secondary | ICD-10-CM

## 2022-01-15 DIAGNOSIS — Z860100 Personal history of colon polyps, unspecified: Secondary | ICD-10-CM

## 2022-01-15 DIAGNOSIS — K635 Polyp of colon: Secondary | ICD-10-CM

## 2022-01-15 DIAGNOSIS — J449 Chronic obstructive pulmonary disease, unspecified: Secondary | ICD-10-CM | POA: Diagnosis not present

## 2022-01-15 DIAGNOSIS — D120 Benign neoplasm of cecum: Secondary | ICD-10-CM

## 2022-01-15 DIAGNOSIS — D123 Benign neoplasm of transverse colon: Secondary | ICD-10-CM

## 2022-01-15 MED ORDER — SODIUM CHLORIDE 0.9 % IV SOLN: 500.0000 mL | Freq: Once | INTRAVENOUS | Status: DC

## 2022-01-15 NOTE — Progress Notes (Signed)
To pacu, VSS. Report to Rn.tb 

## 2022-01-15 NOTE — Progress Notes (Signed)
[facility] History and Physical ? ? ?Primary Care Physician:  Pleas Koch, NP ? ? ?Reason for Procedure:   History of colon polyps ? ?Plan:    colonoscopy ? ? ? ? ?HPI: Jessica [name] is a 71 y.o. female  here for colonoscopy surveillance - 212 polyps removed in 2017, last exam 9/21 - advanced right sided polyp, had recommended a follow up in 6 months, patient is overdue. Patient denies any bowel symptoms at this time. Otherwise feels well without any cardiopulmonary symptoms. Have discussed risks / benefits and she wants to proceed. ? ? ?Past Medical History:  ?Diagnosis Date  ? Allergy   ? seasonal allergies  ? Anemia   ? as a teenager  ? Arthrosis of right acromioclavicular joint 2015  ? Cataract   ? sx 2019  ? Chronic kidney disease   ? hx kidney stones  ? COPD (chronic obstructive pulmonary disease) ([place])   ? infreq exac, continued tobacco abuse  ? Dyslipidemia   ? GERD (gastroesophageal reflux disease)   ? with certain foods  ? History of kidney stones   ? Hyperlipidemia   ? on meds  ? Osteoarthritis of knee   ? R knee  ? Seasonal allergies   ? ? ?Past Surgical History:  ?Procedure Laterality Date  ? ABDOMINAL HYSTERECTOMY  1992  ? APPENDECTOMY  1995  ? COLONOSCOPY    ? CYSTOSCOPY/URETEROSCOPY/HOLMIUM LASER/STENT PLACEMENT Right 08/03/2019  ? Procedure: CYSTOSCOPY/URETEROSCOPY/STENT PLACEMENT;  Surgeon: Robley Fries, MD;  Location: [facility];  Service: Urology;  Laterality: Right;  90 MINS  ? CYSTOSCOPY/URETEROSCOPY/HOLMIUM LASER/STENT PLACEMENT Right 08/17/2019  ? Procedure: CYSTOSCOPY/URETEROSCOPY/HOLMIUM LASER/STENT EXCHANGED;  Surgeon: Robley Fries, MD;  Location: [facility];  Service: Urology;  Laterality: Right;  ? MOUTH SURGERY  2011  ? dental implants  ? POLYPECTOMY    ? right knee surgery  1988  ? "torn cartledge"   1998/2002  ? ROTATOR CUFF REPAIR Right 2015  ? TONSILLECTOMY  1956  ? TUBAL LIGATION  1980  ? [place]  ? ? ?Prior to  Admission medications   ?Medication Sig Start Date End Date Taking? Authorizing Provider  ?Calcium-Vitamins C & D (CALCIUM/C/D PO) Take by mouth 2 (two) times daily. Good sense take one chew twice a day   Yes [provider]  ?Multiple Vitamin (MULTIVITAMIN ADULT PO) Take by mouth daily. [facility] -multivitamin take one daily   Yes [provider]  ?rosuvastatin (CRESTOR) 20 MG tablet TAKE 1 TABLET BY MOUTH ONCE DAILY IN THE EVENING FOR  CHOLESTEROL 06/14/21  Yes Pleas Koch, NP  ?fluticasone (FLONASE) 50 MCG/ACT nasal spray Place 1 spray into both nostrils 2 (two) times daily. ?Patient taking differently: Place 1 spray into both nostrils as needed. 10/12/20   Pleas Koch, NP  ? ? ?Current Outpatient Medications  ?Medication Sig Dispense Refill  ? Calcium-Vitamins C & D (CALCIUM/C/D PO) Take by mouth 2 (two) times daily. Good sense take one chew twice a day    ? Multiple Vitamin (MULTIVITAMIN ADULT PO) Take by mouth daily. [facility] -multivitamin take one daily    ? rosuvastatin (CRESTOR) 20 MG tablet TAKE 1 TABLET BY MOUTH ONCE DAILY IN THE EVENING FOR  CHOLESTEROL 90 tablet 3  ? fluticasone (FLONASE) 50 MCG/ACT nasal spray Place 1 spray into both nostrils 2 (two) times daily. (Patient taking differently: Place 1 spray into both nostrils as needed.) 16 g 5  ? ?Current Facility-Administered Medications  ?  Medication Dose Route Frequency Provider Last Rate Last Admin  ? 0.9 %  sodium chloride infusion  500 mL Intravenous Once Fatiha Guzy, Carlota Raspberry, MD      ? ? ?Allergies as of 01/15/2022 - Review Complete 01/15/2022  ?Allergen Reaction Noted  ? Chantix [varenicline tartrate]  01/01/2022  ? ? ?Family History  ?Problem Relation Age of Onset  ? Hypertension Mother   ? Heart disease Mother   ? Colon polyps Mother 72  ? Multiple sclerosis Father   ? Hypertension Brother   ? Breast cancer Maternal Aunt   ? Stroke Other   ? Colon cancer Neg Hx   ? Esophageal cancer Neg Hx   ? Rectal cancer Neg Hx   ?  Stomach cancer Neg Hx   ? Crohn's disease Neg Hx   ? ? ?Social History  ? ?Socioeconomic History  ? Marital status: Married  ?  Spouse name: Not on file  ? Number of children: Not on file  ? Years of education: Not on file  ? Highest education level: Not on file  ?Occupational History  ? Not on file  ?Tobacco Use  ? Smoking status: Every Day  ?  Packs/day: 1.00  ?  Years: 51.00  ?  Pack years: 51.00  ?  Types: Cigarettes  ?  Passive exposure: Never  ? Smokeless tobacco: Never  ? Tobacco comments:  ?  Contemplation phase; 08-02-2019 "about 0.5 to 1 pack"   ?Vaping Use  ? Vaping Use: Never used  ?Substance and Sexual Activity  ? Alcohol use: Yes  ?  Alcohol/week: 0.0 standard drinks  ?  Comment: rarely  ? Drug use: No  ? Sexual activity: Not on file  ?Other Topics Concern  ? Not on file  ?Social History Narrative  ? Not on file  ? ?Social Determinants of Health  ? ?Financial Resource Strain: Low Risk   ? Difficulty of Paying Living Expenses: Not hard at all  ?Food Insecurity: No Food Insecurity  ? Worried About Charity fundraiser in the Last Year: Never true  ? Ran Out of Food in the Last Year: Never true  ?Transportation Needs: No Transportation Needs  ? Lack of Transportation (Medical): No  ? Lack of Transportation (Non-Medical): No  ?Physical Activity: Inactive  ? Days of Exercise per Week: 0 days  ? Minutes of Exercise per Session: 0 min  ?Stress: No Stress Concern Present  ? Feeling of Stress : Not at all  ?Social Connections: Not on file  ?Intimate Partner Violence: Not At Risk  ? Fear of Current or Ex-Partner: No  ? Emotionally Abused: No  ? Physically Abused: No  ? Sexually Abused: No  ? ? ?Review of Systems: ?All other review of systems negative except as mentioned in the HPI. ? ?Physical Exam: ?Vital signs ?BP 129/68   Pulse 66   Temp (!) 97.3 ?F (36.3 ?C)   Ht 5\' 2"  (1.575 m)   Wt 136 lb (61.7 kg)   SpO2 98%   BMI 24.87 kg/m?  ? ?General:   Alert,  Well-developed, pleasant and cooperative in  NAD ?Lungs:  Clear throughout to auscultation.   ?Heart:  Regular rate and rhythm ?Abdomen:  Soft, nontender and nondistended.   ?Neuro/Psych:  Alert and cooperative. Normal mood and affect. A and O x 3 ? ?Jolly Mango, MD ?[facility] ? ? ?

## 2022-01-15 NOTE — Op Note (Signed)
[facility] ?Patient Name: Jessica [name] ?Procedure Date: 01/15/2022 12:26 PM ?MRN: [id number] ?Endoscopist: Carlota Raspberry. Havery Moros , MD ?Age: 71 ?Referring MD:  ?Date of Birth: 10-30-1950 ?Gender: Female ?Account #: [id number] ?Procedure:                Colonoscopy ?Indications:              High risk colon cancer surveillance: Personal  ?                          history of colonic polyps - 12 polyps removed 2017,  ?                          last exam in 05/2020 with additional polyps  ?                          including advanced polyp removed in piecemeal  ?                          ascending colon ?Medicines:                Monitored Anesthesia Care ?Procedure:                Pre-Anesthesia Assessment: ?                          - Prior to the procedure, a History and Physical  ?                          was performed, and patient medications and  ?                          allergies were reviewed. The patient's tolerance of  ?                          previous anesthesia was also reviewed. The risks  ?                          and benefits of the procedure and the sedation  ?                          options and risks were discussed with the patient.  ?                          All questions were answered, and informed consent  ?                          was obtained. Prior Anticoagulants: The patient has  ?                          taken no previous anticoagulant or antiplatelet  ?                          agents. ASA Grade Assessment: II - A patient with  ?  mild systemic disease. After reviewing the risks  ?                          and benefits, the patient was deemed in  ?                          satisfactory condition to undergo the procedure. ?                          After obtaining informed consent, the colonoscope  ?                          was passed under direct vision. Throughout the  ?                          procedure, the patient's blood pressure, pulse, and   ?                          oxygen saturations were monitored continuously. The  ?                          0405 PCF-H190TL Slim SB Colonoscope was introduced  ?                          through the anus and advanced to the the cecum,  ?                          identified by appendiceal orifice and ileocecal  ?                          valve. The colonoscopy was technically difficult  ?                          and complex due to restricted mobility of the  ?                          colon. The patient tolerated the procedure well.  ?                          The quality of the bowel preparation was adequate.  ?                          The ileocecal valve, appendiceal orifice, and  ?                          rectum were photographed. ?Scope In: 12:30:40 PM ?Scope Out: 1:00:47 PM ?Scope Withdrawal Time: 0 hours 14 minutes 19 seconds  ?Total Procedure Duration: 0 hours 30 minutes 7 seconds  ?Findings:                 The perianal and digital rectal examinations were  ?                          normal. ?  A diminutive polyp was found in the appendiceal  ?                          orifice. The polyp was sessile. The polyp was  ?                          removed with a cold snare. Resection and retrieval  ?                          were complete. ?                          A 3 mm polyp was found in the ascending colon. The  ?                          polyp was sessile. The polyp was removed with a  ?                          cold snare. Resection and retrieval were complete. ?                          A large post polypectomy scar and prior tattoo was  ?                          found in the ascending colon. The scar tissue was  ?                          healthy in appearance. ?                          A 5 mm polyp was found in the transverse colon. The  ?                          polyp was sessile. The polyp was removed with a  ?                          cold snare. Resection and retrieval  were complete. ?                          Internal hemorrhoids were found during retroflexion. ?                          There was restricted mobility of the left colon  ?                          with luminal narrowing. Regular pediatric  ?                          colonoscope could not traverse the area. This was  ?                          replaced with an ultraslim pediatric scope which  ?  was able to traverse the area without problems. The  ?                          prep was adequate but right colon took significant  ?                          lavage to clear it and obtain adequate views. The  ?                          exam was otherwise without abnormality. ?Complications:            No immediate complications. Estimated blood loss:  ?                          Minimal. ?Estimated Blood Loss:     Estimated blood loss was minimal. ?Impression:               - One diminutive polyp at the appendiceal orifice,  ?                          removed with a cold snare. Resected and retrieved. ?                          - One 3 mm polyp in the ascending colon, removed  ?                          with a cold snare. Resected and retrieved. ?                          - Post-polypectomy scar in the ascending colon. ?                          - One 5 mm polyp in the transverse colon, removed  ?                          with a cold snare. Resected and retrieved. ?                          - Internal hemorrhoids. ?                          - Severely restricted mobility of the left colon  ?                          requiring use of ultraslim pediatric colonoscope. ?                          - The examination was otherwise normal. ?Recommendation:           - Patient has a contact number available for  ?                          emergencies. The signs and symptoms of potential  ?  delayed complications were discussed with the  ?                          patient. Return to  normal activities tomorrow.  ?                          Written discharge instructions were provided to the  ?                          patient. ?                          - Resume previous diet. ?                          - Continue present medications. ?                          - Await pathology results. ?                          - Given technical difficulty of this exam with  ?                          restricted mobility of the left colon, risks of  ?                          further colonoscope for surveillance likely  ?                          outweighs benefits given no high risk lesions on  ?                          this exam and significant reduction in polyp  ?                          burden. Will discuss with patient ?Remo Lipps P. Havery Moros, MD ?01/15/2022 1:08:34 PM ?This report has been signed electronically. ?

## 2022-01-15 NOTE — Progress Notes (Signed)
VS-CW  Pt's states no medical or surgical changes since previsit or office visit.  

## 2022-01-15 NOTE — Patient Instructions (Signed)
Handouts Provided:  Polyps  YOU HAD AN ENDOSCOPIC PROCEDURE TODAY AT THE [facility]:   Refer to the procedure report that was given to you for any specific questions about what was found during the examination.  If the procedure report does not answer your questions, please call your gastroenterologist to clarify.  If you requested that your care partner not be given the details of your procedure findings, then the procedure report has been included in a sealed envelope for you to review at your convenience later.  YOU SHOULD EXPECT: Some feelings of bloating in the abdomen. Passage of more gas than usual.  Walking can help get rid of the air that was put into your GI tract during the procedure and reduce the bloating. If you had a lower endoscopy (such as a colonoscopy or flexible sigmoidoscopy) you may notice spotting of blood in your stool or on the toilet paper. If you underwent a bowel prep for your procedure, you may not have a normal bowel movement for a few days.  Please Note:  You might notice some irritation and congestion in your nose or some drainage.  This is from the oxygen used during your procedure.  There is no need for concern and it should clear up in a day or so.  SYMPTOMS TO REPORT IMMEDIATELY:   Following lower endoscopy (colonoscopy or flexible sigmoidoscopy):  Excessive amounts of blood in the stool  Significant tenderness or worsening of abdominal pains  Swelling of the abdomen that is new, acute  Fever of 100F or higher  For urgent or emergent issues, a gastroenterologist can be reached at any hour by calling [phone]. Do not use MyChart messaging for urgent concerns.    DIET:  We do recommend a small meal at first, but then you may proceed to your regular diet.  Drink plenty of fluids but you should avoid alcoholic beverages for 24 hours.  ACTIVITY:  You should plan to take it easy for the rest of today and you should NOT DRIVE or use heavy  machinery until tomorrow (because of the sedation medicines used during the test).    FOLLOW UP: Our staff will call the number listed on your records 48-72 hours following your procedure to check on you and address any questions or concerns that you may have regarding the information given to you following your procedure. If we do not reach you, we will leave a message.  We will attempt to reach you two times.  During this call, we will ask if you have developed any symptoms of COVID 19. If you develop any symptoms (ie: fever, flu-like symptoms, shortness of breath, cough etc.) before then, please call [phone].  If you test positive for Covid 19 in the 2 weeks post procedure, please call and report this information to us.    If any biopsies were taken you will be contacted by phone or by letter within the next 1-3 weeks.  Please call us at [phone] if you have not heard about the biopsies in 3 weeks.    SIGNATURES/CONFIDENTIALITY: You and/or your care partner have signed paperwork which will be entered into your electronic medical record.  These signatures attest to the fact that that the information above on your After Visit Summary has been reviewed and is understood.  Full responsibility of the confidentiality of this discharge information lies with you and/or your care-partner.  

## 2022-01-15 NOTE — Progress Notes (Signed)
Called to room to assist during endoscopic procedure.  Patient ID and intended procedure confirmed with present staff. Received instructions for my participation in the procedure from the performing physician.  

## 2022-01-16 DIAGNOSIS — E042 Nontoxic multinodular goiter: Secondary | ICD-10-CM

## 2022-01-17 ENCOUNTER — Telehealth: Payer: Self-pay

## 2022-01-17 NOTE — Telephone Encounter (Signed)
?  Follow up Call- ? ? ?  01/15/2022  ? 11:18 AM 06/14/2020  ? 10:26 AM  ?Call back number  ?Post procedure Call Back phone  # [phone]  ?Permission to leave phone message Yes Yes  ?  ? ?Patient questions: ? ?Do you have a fever, pain , or abdominal swelling? No. ?Pain Score  0 * ? ?Have you tolerated food without any problems? Yes.   ? ?Have you been able to return to your normal activities? Yes.   ? ?Do you have any questions about your discharge instructions: ?Diet   No. ?Medications  No. ?Follow up visit  No. ? ?Do you have questions or concerns about your Care? No. ? ?Actions: ?* If pain score is 4 or above: ?No action needed, pain <4. ? ? ?

## 2022-01-26 LAB — HM HEPATITIS C SCREENING LAB: HM Hepatitis Screen: NEGATIVE

## 2022-02-01 ENCOUNTER — Encounter: Payer: Self-pay | Admitting: Primary Care

## 2022-04-04 ENCOUNTER — Ambulatory Visit (HOSPITAL_COMMUNITY)
Admission: RE | Admit: 2022-04-04 | Discharge: 2022-04-04 | Disposition: A | Discharge status: Home / Self Care | Payer: Medicare Other | Source: Ambulatory Visit | Attending: Acute Care | Admitting: Acute Care

## 2022-04-04 ENCOUNTER — Telehealth: Payer: Self-pay | Admitting: Acute Care

## 2022-04-04 DIAGNOSIS — F17210 Nicotine dependence, cigarettes, uncomplicated: Secondary | ICD-10-CM | POA: Insufficient documentation

## 2022-04-04 DIAGNOSIS — I700 Atherosclerosis of aorta: Secondary | ICD-10-CM | POA: Diagnosis not present

## 2022-04-04 DIAGNOSIS — J439 Emphysema, unspecified: Secondary | ICD-10-CM | POA: Diagnosis not present

## 2022-04-04 DIAGNOSIS — Z87891 Personal history of nicotine dependence: Secondary | ICD-10-CM | POA: Diagnosis not present

## 2022-04-04 DIAGNOSIS — Z122 Encounter for screening for malignant neoplasm of respiratory organs: Secondary | ICD-10-CM | POA: Diagnosis not present

## 2022-04-04 NOTE — Telephone Encounter (Signed)
IMPRESSION: 1. Lung-RADS 4B, suspicious. 9.4 mm right lower lobe pulmonary nodule has been minimally progressive over multiple prior studies but now shows threshold growth in 12 months consistent with Lung-Rads 4B category. Additional imaging evaluation or consultation with Pulmonology or Thoracic Surgery recommended. 2.  Emphysema (ICD10-J43.9) and Aortic Atherosclerosis (ICD10-170.0)   These results will be called to the ordering clinician or representative by the Radiologist Assistant, and communication documented in the PACS or [employer].     Electronically Signed   By: Misty Stanley M.D.   On: 04/04/2022 14:54

## 2022-04-05 ENCOUNTER — Other Ambulatory Visit: Payer: Self-pay | Admitting: Acute Care

## 2022-04-05 DIAGNOSIS — R911 Solitary pulmonary nodule: Secondary | ICD-10-CM

## 2022-04-05 NOTE — Telephone Encounter (Signed)
Jessica [name], Once the PET is scheduled, she will need follow up with [facility] or [facility] within a week of the scan. If they do not have availability, I can see her. Thanks so much

## 2022-04-05 NOTE — Telephone Encounter (Signed)
I have called the patient with the results of her low dose Ct Chest. I explained that her scan was read as a Lung RADS 4 B indicates suspicious findings for which additional diagnostic testing and or tissue sampling is recommended. I explained that she has a nodule in the right lower lobe that has increased in size from 6.8 mm -9.4 mm today. I explained that this growth over 12 months will require additional follow up. We discussed that best plan is for  PET scan to better evaluate if the nodule is growing . She is in agreement with this plan.  I have ordered the PET scan. I explained she will get a call to get this scheduled.   Jessica [name], I have ordered the PET. Please place on tickle list in the event the nodule is not PET avid so we can have her re-enter screening population .   Jessica [name], I wanted to make sure you were aware of results of the screening scan and the plan. Do not hesitate to reach out if you have any questions or concerns.   Thanks so much all.

## 2022-04-05 NOTE — Telephone Encounter (Signed)
Thanks, Judson Roch!  -Allie Bossier

## 2022-04-15 ENCOUNTER — Encounter (HOSPITAL_COMMUNITY)
Admission: RE | Admit: 2022-04-15 | Discharge: 2022-04-15 | Disposition: A | Discharge status: Home / Self Care | Payer: Medicare Other | Source: Ambulatory Visit | Attending: Acute Care | Admitting: Acute Care

## 2022-04-15 DIAGNOSIS — R911 Solitary pulmonary nodule: Secondary | ICD-10-CM | POA: Diagnosis not present

## 2022-04-15 DIAGNOSIS — R918 Other nonspecific abnormal finding of lung field: Secondary | ICD-10-CM | POA: Diagnosis not present

## 2022-04-15 LAB — GLUCOSE, CAPILLARY: Glucose-Capillary: 95 mg/dL (ref 70–99)

## 2022-04-15 MED ORDER — FLUDEOXYGLUCOSE F - 18 (FDG) INJECTION
7.0000 | Freq: Once | INTRAVENOUS | Status: AC | PRN
Start: 2022-04-15 — End: 2022-04-15
  Administered 2022-04-15: 6.76 via INTRAVENOUS

## 2022-04-16 ENCOUNTER — Ambulatory Visit (INDEPENDENT_AMBULATORY_CARE_PROVIDER_SITE_OTHER): Payer: Medicare Other

## 2022-04-16 VITALS — Ht 62.0 in | Wt 136.0 lb

## 2022-04-16 DIAGNOSIS — Z0000 Encounter for general adult medical examination without abnormal findings: Secondary | ICD-10-CM | POA: Diagnosis not present

## 2022-04-16 NOTE — Patient Instructions (Addendum)
Jessica [name] , Thank you for taking time to come for your Medicare Wellness Visit. I appreciate your ongoing commitment to your health goals. Please review the following plan we discussed and let me know if I can assist you in the future.   These are the goals we discussed:  Goals       No current goals (pt-stated)      Patient Stated      02/01/2020, I will maintain and continue medications as prescribed.       Patient Stated      04/12/2021, I will maintain and continue medications as prescribed.        Advanced directives: Yes  Conditions/risks identified: None  Next appointment: Follow up in one year for your annual wellness visit     Preventive Care 65 Years and Older, Female Preventive care refers to lifestyle choices and visits with your health care provider that can promote health and wellness. What does preventive care include? A yearly physical exam. This is also called an annual well check. Dental exams once or twice a year. Routine eye exams. Ask your health care provider how often you should have your eyes checked. Personal lifestyle choices, including: Daily care of your teeth and gums. Regular physical activity. Eating a healthy diet. Avoiding tobacco and drug use. Limiting alcohol use. Practicing safe sex. Taking low-dose aspirin every day. Taking vitamin and mineral supplements as recommended by your health care provider. What happens during an annual well check? The services and screenings done by your health care provider during your annual well check will depend on your age, overall health, lifestyle risk factors, and family history of disease. Counseling  Your health care provider may ask you questions about your: Alcohol use. Tobacco use. Drug use. Emotional well-being. Home and relationship well-being. Sexual activity. Eating habits. History of falls. Memory and ability to understand (cognition). Work and work Statistician. Reproductive  health. Screening  You may have the following tests or measurements: Height, weight, and BMI. Blood pressure. Lipid and cholesterol levels. These may be checked every 5 years, or more frequently if you are over 73 years old. Skin check. Lung cancer screening. You may have this screening every year starting at age 73 if you have a 30-pack-year history of smoking and currently smoke or have quit within the past 15 years. Fecal occult blood test (FOBT) of the stool. You may have this test every year starting at age 73. Flexible sigmoidoscopy or colonoscopy. You may have a sigmoidoscopy every 5 years or a colonoscopy every 10 years starting at age 73. Hepatitis C blood test. Hepatitis B blood test. Sexually transmitted disease (STD) testing. Diabetes screening. This is done by checking your blood sugar (glucose) after you have not eaten for a while (fasting). You may have this done every 1-3 years. Bone density scan. This is done to screen for osteoporosis. You may have this done starting at age 73. Mammogram. This may be done every 1-2 years. Talk to your health care provider about how often you should have regular mammograms. Talk with your health care provider about your test results, treatment options, and if necessary, the need for more tests. Vaccines  Your health care provider may recommend certain vaccines, such as: Influenza vaccine. This is recommended every year. Tetanus, diphtheria, and acellular pertussis (Tdap, Td) vaccine. You may need a Td booster every 10 years. Zoster vaccine. You may need this after age 73. Pneumococcal 13-valent conjugate (PCV13) vaccine. One dose is recommended after  age 73. Pneumococcal polysaccharide (PPSV23) vaccine. One dose is recommended after age 73. Talk to your health care provider about which screenings and vaccines you need and how often you need them. This information is not intended to replace advice given to you by your health care provider.  Make sure you discuss any questions you have with your health care provider. Document Released: 10/13/2015 Document Revised: 06/05/2016 Document Reviewed: 07/18/2015 Elsevier Interactive Patient Education  2017 [place] Prevention in the Home Falls can cause injuries. They can happen to people of all ages. There are many things you can do to make your home safe and to help prevent falls. What can I do on the outside of my home? Regularly fix the edges of walkways and driveways and fix any cracks. Remove anything that might make you trip as you walk through a door, such as a raised step or threshold. Trim any bushes or trees on the path to your home. Use bright outdoor lighting. Clear any walking paths of anything that might make someone trip, such as rocks or tools. Regularly check to see if handrails are loose or broken. Make sure that both sides of any steps have handrails. Any raised decks and porches should have guardrails on the edges. Have any leaves, snow, or ice cleared regularly. Use sand or salt on walking paths during winter. Clean up any spills in your garage right away. This includes oil or grease spills. What can I do in the bathroom? Use night lights. Install grab bars by the toilet and in the tub and shower. Do not use towel bars as grab bars. Use non-skid mats or decals in the tub or shower. If you need to sit down in the shower, use a plastic, non-slip stool. Keep the floor dry. Clean up any water that spills on the floor as soon as it happens. Remove soap buildup in the tub or shower regularly. Attach bath mats securely with double-sided non-slip rug tape. Do not have throw rugs and other things on the floor that can make you trip. What can I do in the bedroom? Use night lights. Make sure that you have a light by your bed that is easy to reach. Do not use any sheets or blankets that are too big for your bed. They should not hang down onto the floor. Have a  firm chair that has side arms. You can use this for support while you get dressed. Do not have throw rugs and other things on the floor that can make you trip. What can I do in the kitchen? Clean up any spills right away. Avoid walking on wet floors. Keep items that you use a lot in easy-to-reach places. If you need to reach something above you, use a strong step stool that has a grab bar. Keep electrical cords out of the way. Do not use floor polish or wax that makes floors slippery. If you must use wax, use non-skid floor wax. Do not have throw rugs and other things on the floor that can make you trip. What can I do with my stairs? Do not leave any items on the stairs. Make sure that there are handrails on both sides of the stairs and use them. Fix handrails that are broken or loose. Make sure that handrails are as long as the stairways. Check any carpeting to make sure that it is firmly attached to the stairs. Fix any carpet that is loose or worn. Avoid having throw rugs  at the top or bottom of the stairs. If you do have throw rugs, attach them to the floor with carpet tape. Make sure that you have a light switch at the top of the stairs and the bottom of the stairs. If you do not have them, ask someone to add them for you. What else can I do to help prevent falls? Wear shoes that: Do not have high heels. Have rubber bottoms. Are comfortable and fit you well. Are closed at the toe. Do not wear sandals. If you use a stepladder: Make sure that it is fully opened. Do not climb a closed stepladder. Make sure that both sides of the stepladder are locked into place. Ask someone to hold it for you, if possible. Clearly mark and make sure that you can see: Any grab bars or handrails. First and last steps. Where the edge of each step is. Use tools that help you move around (mobility aids) if they are needed. These include: Canes. Walkers. Scooters. Crutches. Turn on the lights when you  go into a dark area. Replace any light bulbs as soon as they burn out. Set up your furniture so you have a clear path. Avoid moving your furniture around. If any of your floors are uneven, fix them. If there are any pets around you, be aware of where they are. Review your medicines with your doctor. Some medicines can make you feel dizzy. This can increase your chance of falling. Ask your doctor what other things that you can do to help prevent falls. This information is not intended to replace advice given to you by your health care provider. Make sure you discuss any questions you have with your health care provider. Document Released: 07/13/2009 Document Revised: 02/22/2016 Document Reviewed: 10/21/2014 Elsevier Interactive Patient Education  2017 [employer].  This is a list of the screening recommended for you and due dates:  Health Maintenance  Topic Date Due   COVID-19 Vaccine (4 - Pfizer series) 05/02/2022*   Tetanus Vaccine  04/17/2023*   Flu Shot  04/30/2022   Colon Cancer Screening  01/16/2023   Mammogram  05/10/2023   Pneumonia Vaccine  Completed   DEXA scan (bone density measurement)  Completed   Hepatitis C Screening: USPSTF Recommendation to screen - Ages 90-79 yo.  Completed   HPV Vaccine  Aged Out   Cologuard (Stool DNA test)  Discontinued   Zoster (Shingles) Vaccine  Discontinued  *Topic was postponed. The date shown is not the original due date.

## 2022-04-16 NOTE — Progress Notes (Signed)
Subjective:   Jessica [name] is a 71 y.o. female who presents for Medicare Annual (Subsequent) preventive examination.  Review of Systems    Virtual Visit via Telephone Note  I connected with  Jessica [name] on 04/16/22 at 11:00 AM EDT by telephone and verified that I am speaking with the correct person using two identifiers.  Location: Patient: Home Provider: Office Persons participating in the virtual visit: patient/Nurse Health Advisor   I discussed the limitations, risks, security and privacy concerns of performing an evaluation and management service by telephone and the availability of in person appointments. The patient expressed understanding and agreed to proceed.  Interactive audio and video telecommunications were attempted between this nurse and patient, however failed, due to patient having technical difficulties OR patient did not have access to video capability.  We continued and completed visit with audio only.  Some vital signs may be absent or patient reported.   Criselda Peaches, LPN  Cardiac Risk Factors include: advanced age (>45men, >1 women);smoking/ tobacco exposure;Other (see comment), Risk factor comments: COPD     Objective:    Today's Vitals   04/16/22 1058  Weight: 136 lb (61.7 kg)  Height: 5\' 2"  (1.575 m)   Body mass index is 24.87 kg/m.     04/16/2022   11:06 AM 04/12/2021    3:31 PM 02/01/2020   11:15 AM 08/17/2019    7:14 AM 08/02/2019   11:15 AM 08/01/2019    4:15 PM 12/22/2015    7:50 AM  Advanced Directives  Does Patient Have a Medical Advance Directive? Yes Yes Yes Yes Yes No Yes  Type of Paramedic of [place];Living will [employer];Living will [place];Living will Living will Living will    Does patient want to make changes to medical advance directive? No - Patient declined   No - Patient declined No - Patient declined    Copy of [place] in  Chart? Yes - validated most recent copy scanned in chart (See row information) No - copy requested No - copy requested    No - copy requested  Would patient like information on creating a medical advance directive?    No - Patient declined  Yes (ED - Information included in AVS)     Current Medications (verified) Outpatient Encounter Medications as of 04/16/2022  Medication Sig   Calcium-Vitamins C & D (CALCIUM/C/D PO) Take by mouth 2 (two) times daily. Good sense take one chew twice a day   fluticasone (FLONASE) 50 MCG/ACT nasal spray Place 1 spray into both nostrils 2 (two) times daily. (Patient taking differently: Place 1 spray into both nostrils as needed.)   Multiple Vitamin (MULTIVITAMIN ADULT PO) Take by mouth daily. [place] -multivitamin take one daily   rosuvastatin (CRESTOR) 20 MG tablet TAKE 1 TABLET BY MOUTH ONCE DAILY IN THE EVENING FOR  CHOLESTEROL   No facility-administered encounter medications on file as of 04/16/2022.    Allergies (verified) Chantix [varenicline tartrate]   History: Past Medical History:  Diagnosis Date   Allergy    seasonal allergies   Anemia    as a teenager   Arthrosis of right acromioclavicular joint 2015   Cataract    sx 2019   Chronic kidney disease    hx kidney stones   COPD (chronic obstructive pulmonary disease) (HCC)    infreq exac, continued tobacco abuse   Dyslipidemia    GERD (gastroesophageal reflux disease)  with certain foods   History of kidney stones    Hyperlipidemia    on meds   Osteoarthritis of knee    R knee   Seasonal allergies    Past Surgical History:  Procedure Laterality Date   [place]   COLONOSCOPY     CYSTOSCOPY/URETEROSCOPY/HOLMIUM LASER/STENT PLACEMENT Right 08/03/2019   Procedure: CYSTOSCOPY/URETEROSCOPY/STENT PLACEMENT;  Surgeon: Robley Fries, MD;  Location: [facility];  Service: Urology;  Laterality: Right;  90 MINS   CYSTOSCOPY/URETEROSCOPY/HOLMIUM  LASER/STENT PLACEMENT Right 08/17/2019   Procedure: CYSTOSCOPY/URETEROSCOPY/HOLMIUM LASER/STENT EXCHANGED;  Surgeon: Robley Fries, MD;  Location: [facility];  Service: Urology;  Laterality: Right;   MOUTH SURGERY  2011   dental implants   POLYPECTOMY     right knee surgery  1988   "torn cartledge"   1998/2002   ROTATOR CUFF REPAIR Right 2015   TONSILLECTOMY  1956   TUBAL LIGATION  1980   WISDOM TOOTH EXTRACTION  1970   Family History  Problem Relation Age of Onset   Hypertension Mother    Heart disease Mother    Colon polyps Mother 43   Multiple sclerosis Father    Hypertension Brother    Breast cancer Maternal Aunt    Stroke Other    Colon cancer Neg Hx    Esophageal cancer Neg Hx    Rectal cancer Neg Hx    Stomach cancer Neg Hx    Crohn's disease Neg Hx    Social History   Socioeconomic History   Marital status: Married    Spouse name: Not on file   Number of children: Not on file   Years of education: Not on file   Highest education level: Not on file  Occupational History   Not on file  Tobacco Use   Smoking status: Every Day    Packs/day: 1.00    Years: 51.00    Total pack years: 51.00    Types: Cigarettes    Passive exposure: Never   Smokeless tobacco: Never   Tobacco comments:    Contemplation phase; 08-02-2019 "about 0.5 to 1 pack"   Vaping Use   Vaping Use: Never used  Substance and Sexual Activity   Alcohol use: Yes    Alcohol/week: 0.0 standard drinks of alcohol    Comment: rarely   Drug use: No   Sexual activity: Not on file  Other Topics Concern   Not on file  Social History Narrative   Not on file   Social Determinants of Health   Financial Resource Strain: Low Risk  (04/16/2022)   Overall Financial Resource Strain (CARDIA)    Difficulty of Paying Living Expenses: Not hard at all  Food Insecurity: No Food Insecurity (04/16/2022)   Hunger Vital Sign    Worried About Running Out of Food in the Last Year: Never true     Ran Out of Food in the Last Year: Never true  Transportation Needs: No Transportation Needs (04/16/2022)   PRAPARE - Hydrologist (Medical): No    Lack of Transportation (Non-Medical): No  Physical Activity: Inactive (04/16/2022)   Exercise Vital Sign    Days of Exercise per Week: 0 days    Minutes of Exercise per Session: 0 min  Stress: No Stress Concern Present (04/16/2022)   [place]    Feeling of Stress : Not at all  Social Connections: Moderately Isolated (  04/16/2022)   Social Connection and Isolation Panel [NHANES]    Frequency of Communication with Friends and Family: More than three times a week    Frequency of Social Gatherings with Friends and Family: More than three times a week    Attends Religious Services: Never    Marine scientist or Organizations: No    Attends Archivist Meetings: Never    Marital Status: Married    Tobacco Counseling Ready to quit: No Counseling given: Yes Tobacco comments: Contemplation phase; 08-02-2019 "about 0.5 to 1 pack"    Clinical Intake:  Pre-visit preparation completed: No  Diabetic?  No    Activities of Daily Living    04/16/2022   11:04 AM  In your present state of health, do you have any difficulty performing the following activities:  Hearing? 0  Vision? 0  Difficulty concentrating or making decisions? 0  Walking or climbing stairs? 0  Dressing or bathing? 0  Doing errands, shopping? 0  Preparing Food and eating ? N  Using the Toilet? N  In the past six months, have you accidently leaked urine? N  Do you have problems with loss of bowel control? N  Managing your Medications? N  Managing your Finances? N  Housekeeping or managing your Housekeeping? N    Patient Care Team: Pleas Koch, NP as PCP - General (Internal Medicine)  Indicate any recent Medical Services you may have received from other than  [facility] providers in the past year (date may be approximate).     Assessment:   This is a routine wellness examination for [facility].  Hearing/Vision screen Hearing Screening - Comments:: No hearing difficulty Vision Screening - Comments:: Wears reading glasses. Followed by [facility]  Dietary issues and exercise activities discussed: Exercise limited by: None identified   Goals Addressed               This Visit's Progress     No current goals (pt-stated)         Depression Screen    04/16/2022   11:02 AM 04/12/2021    3:33 PM 02/01/2020   11:17 AM 04/20/2018    7:51 PM 12/08/2015    8:52 AM 04/16/2013    8:55 AM  PHQ 2/9 Scores  PHQ - 2 Score 0 0 0 0 0 0  PHQ- 9 Score  0 0       Fall Risk    04/16/2022   11:05 AM 06/06/2021   11:55 AM 04/12/2021    3:33 PM 02/01/2020   11:17 AM 12/08/2015    8:52 AM  Fall Risk   Falls in the past year? 0 0 0 0 No  Number falls in past yr: 0 0 0 0   Injury with Fall? 0 0 0 0   Risk for fall due to : No Fall Risks  No Fall Risks No Fall Risks   Follow up   Falls evaluation completed;Falls prevention discussed Falls evaluation completed;Falls prevention discussed     FALL RISK PREVENTION PERTAINING TO THE HOME:  Any stairs in or around the home? Yes  If so, are there any without handrails? No  Home free of loose throw rugs in walkways, pet beds, electrical cords, etc? Yes  Adequate lighting in your home to reduce risk of falls? Yes   ASSISTIVE DEVICES UTILIZED TO PREVENT FALLS:  Life alert? No  Use of a cane, walker or w/c? No  Grab bars in the bathroom? No  Shower chair or bench in shower? No  Elevated toilet seat or a handicapped toilet? No   TIMED UP AND GO:  Was the test performed? No . Audio Visit  Cognitive Function:    04/12/2021    3:36 PM 02/01/2020   11:20 AM  MMSE - Mini Mental State Exam  Orientation to time 5 5  Orientation to Place 5 5  Registration 3 3  Attention/ Calculation 5 5  Recall 3 3  Language-  repeat 1 1        04/16/2022   11:06 AM  6CIT Screen  What Year? 0 points  What month? 0 points  What time? 0 points  Count back from 20 0 points  Months in reverse 0 points  Repeat phrase 0 points  Total Score 0 points    Immunizations Immunization History  Administered Date(s) Administered   Influenza Split 07/27/2012   Influenza, High Dose Seasonal PF 07/28/2018, 07/17/2019   Influenza-Unspecified 07/17/2019   PFIZER(Purple Top)SARS-COV-2 Vaccination 12/12/2019, 01/03/2020, 09/29/2020   Pneumococcal Conjugate-13 09/29/2015   Pneumococcal Polysaccharide-23 02/21/2012, 04/21/2020   Td 09/30/2001   Tdap 02/21/2012   Zoster, Live 02/09/2013    TDAP status: Due, Education has been provided regarding the importance of this vaccine. Advised may receive this vaccine at local pharmacy or Health Dept. Aware to provide a copy of the vaccination record if obtained from local pharmacy or Health Dept. Verbalized acceptance and understanding.    Pneumococcal vaccine status: Up to date  Covid-19 vaccine status: Completed vaccines  Qualifies for Shingles Vaccine? Yes   Zostavax completed No   Shingrix Completed?: No.    Education has been provided regarding the importance of this vaccine. Patient has been advised to call insurance company to determine out of pocket expense if they have not yet received this vaccine. Advised may also receive vaccine at local pharmacy or Health Dept. Verbalized acceptance and understanding.  Screening Tests Health Maintenance  Topic Date Due   COVID-19 Vaccine (4 - Pfizer series) 05/02/2022 (Originally 11/24/2020)   TETANUS/TDAP  04/17/2023 (Originally 02/20/2022)   INFLUENZA VACCINE  04/30/2022   COLONOSCOPY (Pts 45-32yrs Insurance coverage will need to be confirmed)  01/16/2023   MAMMOGRAM  05/10/2023   Pneumonia Vaccine 70+ Years old  Completed   DEXA SCAN  Completed   Hepatitis C Screening  Completed   HPV VACCINES  Aged Out   Fecal DNA  (Cologuard)  Discontinued   Zoster Vaccines- Shingrix  Discontinued    Health Maintenance  There are no preventive care reminders to display for this patient.   Colorectal cancer screening: Type of screening: Colonoscopy. Completed 01/15/22. Repeat every 1 years  Mammogram status: Completed 05/09/21. Repeat every year  Bone Density status: Completed 05/09/21. Results reflect: Bone density results: OSTEOPOROSIS. Repeat every   years.  Lung Cancer Screening: (Low Dose CT Chest recommended if Age 60-80 years, 30 pack-year currently smoking OR have quit w/in 15years.) does qualify.   Lung Cancer Screening Referral: Patient deferred  Additional Screening:  Hepatitis C Screening: does qualify; Completed 01/26/22  Vision Screening: Recommended annual ophthalmology exams for early detection of glaucoma and other disorders of the eye. Is the patient up to date with their annual eye exam?  Yes  Who is the provider or what is the name of the office in which the patient attends annual eye exams? [facility] If pt is not established with a provider, would they like to be referred to a provider to establish care? No .  Dental Screening: Recommended annual dental exams for proper oral hygiene  Community Resource Referral / Chronic Care Management:  CRR required this visit?  No   CCM required this visit?  No      Plan:     I have personally reviewed and noted the following in the patient's chart:   Medical and social history Use of alcohol, tobacco or illicit drugs  Current medications and supplements including opioid prescriptions.  Functional ability and status Nutritional status Physical activity Advanced directives List of other physicians Hospitalizations, surgeries, and ER visits in previous 12 months Vitals Screenings to include cognitive, depression, and falls Referrals and appointments  In addition, I have reviewed and discussed with patient certain preventive protocols,  quality metrics, and best practice recommendations. A written personalized care plan for preventive services as well as general preventive health recommendations were provided to patient.     Criselda Peaches, LPN   [id number]   Nurse Notes: None

## 2022-04-18 NOTE — Telephone Encounter (Signed)
Patient called in stating she just received this message regarding her [place]. She stated that it is fine to proceed and she would like it to be done again in [place]. Thank you!

## 2022-04-18 NOTE — Telephone Encounter (Signed)
Pt is agreeable to have US done ,pt would like to have this done in [place]

## 2022-04-22 ENCOUNTER — Ambulatory Visit
Admission: RE | Admit: 2022-04-22 | Discharge: 2022-04-22 | Disposition: A | Discharge status: Home / Self Care | Payer: Medicare Other | Source: Ambulatory Visit | Attending: Primary Care | Admitting: Primary Care

## 2022-04-22 DIAGNOSIS — E042 Nontoxic multinodular goiter: Secondary | ICD-10-CM

## 2022-04-22 DIAGNOSIS — E010 Iodine-deficiency related diffuse (endemic) goiter: Secondary | ICD-10-CM | POA: Diagnosis not present

## 2022-04-23 ENCOUNTER — Other Ambulatory Visit: Payer: Self-pay | Admitting: Primary Care

## 2022-04-23 DIAGNOSIS — E041 Nontoxic single thyroid nodule: Secondary | ICD-10-CM

## 2022-04-26 ENCOUNTER — Ambulatory Visit (INDEPENDENT_AMBULATORY_CARE_PROVIDER_SITE_OTHER): Payer: Medicare Other | Admitting: Acute Care

## 2022-04-26 ENCOUNTER — Encounter: Payer: Self-pay | Admitting: Acute Care

## 2022-04-26 VITALS — BP 116/74 | HR 76 | Temp 98.5°F | Ht 62.0 in | Wt 146.0 lb

## 2022-04-26 DIAGNOSIS — Z87891 Personal history of nicotine dependence: Secondary | ICD-10-CM | POA: Diagnosis not present

## 2022-04-26 DIAGNOSIS — R911 Solitary pulmonary nodule: Secondary | ICD-10-CM

## 2022-04-26 NOTE — Patient Instructions (Addendum)
It is good to see you today. I have asked Dr. Lamonte Sakai to look at the PET scan to see what he recommends for follow up. Options are continue to follow up CT scans, or biopsy this now. For now we will tentatively plan on a 3 month follow up CT. Chest.  If Dr. Lamonte Sakai feels we should biopsy this, we will call you and let you know.  Continue to work on quitting smoking.  Follow up appointment after follow up CT Chest with Judson Roch NP after CT has been done >> 3 months Please contact office for sooner follow up if symptoms do not improve or worsen or seek emergency care     Addendum after speaking with Dr. Lamonte Sakai 7/28 4 pm >> Pt was called with the below changes and agreed to the plan as outlined below.  We will schedule PFT's If they show you are a good candidate for surgery, we will refer you to [facility] for evaluation for surgical resection If you are not a good candidate for surgery, we will plan on biopsy and possible radiation treatment to treat if it is indeed a slow growing adenocarcinoma. .  Continue working on quitting smoking.  Follow up appointment with Judson Roch NP after PFT's to review We will not cancel the 3 month Ct chest until we are sure biopsy is not in the plan.  Please contact office for sooner follow up if symptoms do not improve or worsen or seek emergency care

## 2022-04-26 NOTE — Progress Notes (Signed)
History of Present Illness Jessica [name] is a 71 y.o. female current every day smoker followed through the screening program. She is here for follow up of a  right lower lobe nodule which shows minimal growth over multiple studies dating back to 06/29/2020 when it measured 5.2 mm. This nodule now demonstrates threshold growth from 6.8 mm on 04/03/2021 to 9.4 mm today.   04/26/2022 Pt. Presents for follow up after abnormal CT Chest Lung Cancer Screening. She had a LDCT screening CT Chest whjich showed She had a PET scan that shows  minimal FDG avidity of the 9 mm RLL nodule, however given interval growth of the nodule, a very low-grade primary bronchogenic adenocarcinoma remains a pertinent differential consideration. Radiology recommend continued CT Chest surveillance. I have messaged Dr. Lamonte Sakai , and I have asked him to review the scan to see if he agrees with surveillance imaging vs biopsy. Patient is agreeable to both . We will plan for a 3 month follow up, but if Dr. Lamonte Sakai feels we should move froward with biopsy, we will call patient and let her know, and order appropriately. She is in her baseline state of health. No complaints. She has never had PFT's. She denies unexplained weight loss, no hemoptysis. She is still smoking 0.5- 1 PPD. We have discussed smoking cessation. She is working on it now.   She is currently also getting her thyroid worked up. She has had an [place] of her thyroid, which recommends FNA biopsy of moderately suspicious inferior right nodule.This is being managed and scheduled through her PCP, Jessica Friendly, NP, who has placed all orders.   Addendum 04/26/2022 I have spoken with Dr. Lamonte Sakai . He feels this nodule is a slow growing adenocarcinoma. He recommended speaking with the patient to se if she wants to be agressive or do a follow up Ct Chest. I have called Jessica [name]. She would like to do something now. I am getting PFT's, and will bring her in to see me after to  determine if she is a good surgical candidate. If she is a good surgical candidate, I will sent her to [facility] to be seen. If she is not a surgical candidate, we will plan on biopsy and SBRT if it is a cancer.    Test Results: 04/15/2022 PET Scan Very low-level, similar to that of background lung parenchyma, FDG activity in the 9 mm right lower lobe pulmonary nodule on image 36/7 with a max SUV of 1.3. IMPRESSION: 1. Minimal FDG avidity in the 9 mm right lower lobe pulmonary nodule, similar to that of background lung parenchyma, which is nonspecific and typically indicating a benign nodule. However, given the nodules slow interval growth a very low-grade primary bronchogenic adenocarcinoma remains a pertinent differential consideration. Continued CT surveillance is suggested. 2. Asymmetric hypermetabolic thickening of the right glossotonsillar fold, nonspecific suggest correlation with direct visualization. 3.  Aortic Atherosclerosis (ICD10-I70.0).  04/04/2022 CT Chest Lung Cancer Screening Centrilobular and paraseptal emphysema evident. Biapical pleuroparenchymal scarring evident. Multiple scattered tiny bilateral pulmonary nodules are stable in the interval. An additional right lower lobe nodule on image 139 shows minimal growth over multiple studies dating back to 06/29/2020 when it measured 5.2 mm. This nodule now demonstrates threshold growth from 6.8 mm on 04/03/2021 to 9.4 mm today. No focal airspace consolidation. No pleural effusion.  Lung-RADS 4B, suspicious. 9.4 mm right lower lobe pulmonary nodule has been minimally progressive over multiple prior studies but now shows threshold growth in 12 months consistent  with Lung-Rads 4B category.     Latest Ref Rng & Units 06/06/2021   12:37 PM 01/11/2020   11:57 AM 08/01/2019    9:37 PM  CBC  WBC 4.0 - 10.5 K/uL 7.3  7.4  7.8   Hemoglobin 12.0 - 15.0 g/dL 14.5  14.2  13.8   Hematocrit 36.0 - 46.0 % 45.1  42.6  43.7   Platelets  150.0 - 400.0 K/uL 202.0  228.0  194        Latest Ref Rng & Units 06/06/2021   12:37 PM 04/21/2020   11:31 AM 01/11/2020   11:57 AM  BMP  Glucose 70 - 99 mg/dL 89  94  84   BUN 6 - 23 mg/dL 23  23  26    Creatinine 0.40 - 1.20 mg/dL 1.11  1.12  1.29   Sodium 135 - 145 mEq/L 141  141  140   Potassium 3.5 - 5.1 mEq/L 4.6  4.6  4.8   Chloride 96 - 112 mEq/L 102  104  106   CO2 19 - 32 mEq/L 29  30  28    Calcium 8.4 - 10.5 mg/dL 9.5  9.5  9.5     BNP No results found for: "BNP"  ProBNP No results found for: "PROBNP"  PFT No results found for: "FEV1PRE", "FEV1POST", "FVCPRE", "FVCPOST", "TLC", "DLCOUNC", "PREFEV1FVCRT", "PSTFEV1FVCRT"  US THYROID  Result Date: 04/22/2022 CLINICAL DATA:  Multinodular goiter EXAM: THYROID ULTRASOUND TECHNIQUE: Ultrasound examination of the thyroid gland and adjacent soft tissues was performed. COMPARISON:  01/19/2021 FINDINGS: Parenchymal Echotexture: Moderately heterogenous Isthmus: 0.4 cm thickness, previously 0.5 Right lobe: 4.6 x 1.4 x 2 cm, previously 4.8 x 1.5 x 1.6 Left lobe: 3.7 x 1.4 x 2.3 cm, previously 3.8 x 1.5 x 1.9 _________________________________________________________ Estimated total number of nodules >/= 1 cm: 6-10 Number of spongiform nodules >/=  2 cm not described below (TR1): 0 Number of mixed cystic and solid nodules >/= 1.5 cm not described below ([place]): 0 _________________________________________________________ Nodule # 1: Prior biopsy: No Location: Right; superior Maximum size: 1.2 cm; Other 2 dimensions: 1 x 0.6 cm, previously, 1 x 0.8 x 0.6 cm Composition: solid/almost completely solid (2) Echogenicity: hypoechoic (2) Shape: not taller-than-wide (0) Margins: ill-defined (0) Echogenic foci: none (0) ACR TI-RADS total points: 4. ACR TI-RADS risk category:  TR 4. Significant change in size (>/= 20% in two dimensions and minimal increase of 2 mm): No Change in features: No Change in ACR TI-RADS risk category: No ACR TI-RADS  recommendations: *Given size (>/= 1 - 1.4 cm) and appearance, a follow-up ultrasound in 1 year should be considered based on TI-RADS criteria. _________________________________________________________ Nodule # 2: Prior biopsy: No Location: Right; superior Maximum size: 1.4 cm; Other 2 dimensions: 1.2 x 0.8 cm, previously, 1.1 x 1 x 0.7 cm Composition: solid/almost completely solid (2) Echogenicity: hypoechoic (2) Shape: not taller-than-wide (0) Margins: ill-defined (0) Echogenic foci: none (0) ACR TI-RADS total points: 4. ACR TI-RADS risk category:  TR 4. Significant change in size (>/= 20% in two dimensions and minimal increase of 2 mm): No Change in features: No Change in ACR TI-RADS risk category: No ACR TI-RADS recommendations: *Given size (>/= 1 - 1.4 cm) and appearance, a follow-up ultrasound in 1 year should be considered based on TI-RADS criteria. _________________________________________________________ Nodule 3: 0.8 cm isoechoic nodule without calcifications, mid right; This nodule does NOT meet TI-RADS criteria for biopsy or dedicated follow-up. Nodule # 4: Prior biopsy: No Location: Right; medial inferior Maximum size:  1.6 cm; Other 2 dimensions: 1.2 x 0.7 cm, previously, 1.4 x 1.1 x 0.7 cm Composition: solid/almost completely solid (2) Echogenicity: hypoechoic (2) Shape: not taller-than-wide (0) Margins: smooth (0) Echogenic foci: none (0) ACR TI-RADS total points: 4. ACR TI-RADS risk category:  TR 4. Significant change in size (>/= 20% in two dimensions and minimal increase of 2 mm): No Change in features: No Change in ACR TI-RADS risk category: No ACR TI-RADS recommendations: **Given size (>/= 1.5 cm) and appearance, fine needle aspiration of this moderately suspicious nodule should be considered based on TI-RADS criteria. _________________________________________________________ Nodule 5: 1 cm isoechoic superior left nodule without calcifications, stable; This nodule does NOT meet TI-RADS criteria  for biopsy or dedicated follow-up. Nodule # 6: Prior biopsy: No Location: Left; mid Maximum size: 1.6 cm; Other 2 dimensions: 1.3 x 1.1 cm, previously, 1.4 x 1.3 x 0.8 cm Composition: solid/almost completely solid (2) Echogenicity: isoechoic (1) Shape: not taller-than-wide (0) Margins: ill-defined (0) Echogenic foci: none (0) ACR TI-RADS total points: 3. ACR TI-RADS risk category:  TR 3. Significant change in size (>/= 20% in two dimensions and minimal increase of 2 mm): No Change in features: No Change in ACR TI-RADS risk category: No ACR TI-RADS recommendations: *Given size (>/= 1.5 - 2.4 cm) and appearance, a follow-up ultrasound in 1 year should be considered based on TI-RADS criteria. _________________________________________________________ Nodule # 7: Prior biopsy: No Location: Left; inferior Maximum size: 1.7 cm; Other 2 dimensions: 1.6 x 0.8 cm, previously, 1.6 x 1.1 x 1.2 cm Composition: solid/almost completely solid (2) Echogenicity: isoechoic (1) Shape: not taller-than-wide (0) Margins: ill-defined (0) Echogenic foci: none (0) ACR TI-RADS total points: 3. ACR TI-RADS risk category:  TR 3. Significant change in size (>/= 20% in two dimensions and minimal increase of 2 mm): No Change in features: No Change in ACR TI-RADS risk category: No ACR TI-RADS recommendations: *Given size (>/= 1.5 - 2.4 cm) and appearance, a follow-up ultrasound in 1 year should be considered based on TI-RADS criteria. _________________________________________________________ No regional cervical adenopathy identified. IMPRESSION: 1. Borderline thyromegaly with bilateral nodules. 2. Recommend FNA biopsy of moderately suspicious inferior right nodule. 3. Recommend annual/biennial ultrasound follow-up of additional nodules as above, until stability x5 years confirmed. The above is in keeping with the ACR TI-RADS recommendations - J Am Coll Radiol 2017;14:587-595. Electronically Signed   By: Lucrezia Europe M.D.   On: 04/22/2022 16:28    NM PET Image Initial (PI) Skull Base To Thigh (F-18 FDG)  Result Date: 04/16/2022 CLINICAL DATA:  Initial treatment strategy for pulmonary nodule. EXAM: NUCLEAR MEDICINE PET SKULL BASE TO THIGH TECHNIQUE: 6.76 mCi F-18 FDG was injected intravenously. Full-ring PET imaging was performed from the skull base to thigh after the radiotracer. CT data was obtained and used for attenuation correction and anatomic localization. Fasting blood glucose: 95 mg/dl COMPARISON:  Multiple priors including chest CT April 04, 2022. FINDINGS: Mediastinal blood pool activity: SUV max 2.05 Liver activity: SUV max NA NECK: Asymmetric hypermetabolic thickening of the right glossotonsillar fold with a max SUV of 5.2. No hypermetabolic cervical adenopathy. Incidental CT findings: none CHEST: Very low-level, similar to that of background lung parenchyma, FDG activity in the 9 mm right lower lobe pulmonary nodule on image 36/7 with a max SUV of 1.3. No hypermetabolic thoracic adenopathy. Incidental CT findings: Aortic atherosclerosis. ABDOMEN/PELVIS: No abnormal hypermetabolic activity within the liver, pancreas, adrenal glands, or spleen. No hypermetabolic lymph nodes in the abdomen or pelvis. Incidental CT findings: Nonobstructive right renal  stones. Surgical clips in the right pericolic gutter. Aortic atherosclerosis. SKELETON: No focal hypermetabolic activity to suggest skeletal metastasis. Incidental CT findings: none IMPRESSION: 1. Minimal FDG avidity in the 9 mm right lower lobe pulmonary nodule, similar to that of background lung parenchyma, which is nonspecific and typically indicating a benign nodule. However, given the nodules slow interval growth a very low-grade primary bronchogenic adenocarcinoma remains a pertinent differential consideration. Continued CT surveillance is suggested. 2. Asymmetric hypermetabolic thickening of the right glossotonsillar fold, nonspecific suggest correlation with direct visualization. 3.  Aortic  Atherosclerosis (ICD10-I70.0). Electronically Signed   By: Dahlia Bailiff M.D.   On: 04/16/2022 14:04   CT CHEST LUNG CA SCREEN LOW DOSE W/O CM  Result Date: 04/04/2022 CLINICAL DATA:  71 year old female with 54 pack-year history of smoking. Lung cancer screening. EXAM: CT CHEST WITHOUT CONTRAST LOW-DOSE FOR LUNG CANCER SCREENING TECHNIQUE: Multidetector CT imaging of the chest was performed following the standard protocol without IV contrast. RADIATION DOSE REDUCTION: This exam was performed according to the departmental dose-optimization program which includes automated exposure control, adjustment of the mA and/or kV according to patient size and/or use of iterative reconstruction technique. COMPARISON:  04/03/2021 FINDINGS: Cardiovascular: The heart size is normal. No substantial pericardial effusion. Mild atherosclerotic calcification is noted in the wall of the thoracic aorta. Coronary artery calcification is evident. Mediastinum/Nodes: No mediastinal lymphadenopathy. The esophagus has normal imaging features. No evidence for gross hilar lymphadenopathy although assessment is limited by the lack of intravenous contrast on the current study. There is no axillary lymphadenopathy. Lungs/Pleura: Centrilobular and paraseptal emphysema evident. Biapical pleuroparenchymal scarring evident. Multiple scattered tiny bilateral pulmonary nodules are stable in the interval. An additional right lower lobe nodule on image 139 shows minimal growth over multiple studies dating back to 06/29/2020 when it measured 5.2 mm. This nodule now demonstrates threshold growth from 6.8 mm on 04/03/2021 to 9.4 mm today. No focal airspace consolidation. No pleural effusion. Upper Abdomen: Unremarkable. Musculoskeletal: No worrisome lytic or sclerotic osseous abnormality. IMPRESSION: 1. Lung-RADS 4B, suspicious. 9.4 mm right lower lobe pulmonary nodule has been minimally progressive over multiple prior studies but now shows threshold  growth in 12 months consistent with Lung-Rads 4B category. Additional imaging evaluation or consultation with Pulmonology or Thoracic Surgery recommended. 2.  Emphysema (ICD10-J43.9) and Aortic Atherosclerosis (ICD10-170.0) These results will be called to the ordering clinician or representative by the Radiologist Assistant, and communication documented in the PACS or [employer]. Electronically Signed   By: Misty Stanley M.D.   On: 04/04/2022 14:54     Past medical hx Past Medical History:  Diagnosis Date   Allergy    seasonal allergies   Anemia    as a teenager   Arthrosis of right acromioclavicular joint 2015   Cataract    sx 2019   Chronic kidney disease    hx kidney stones   COPD (chronic obstructive pulmonary disease) (HCC)    infreq exac, continued tobacco abuse   Dyslipidemia    GERD (gastroesophageal reflux disease)    with certain foods   History of kidney stones    Hyperlipidemia    on meds   Osteoarthritis of knee    R knee   Seasonal allergies      Social History   Tobacco Use   Smoking status: Every Day    Packs/day: 1.00    Years: 51.00    Total pack years: 51.00    Types: Cigarettes    Passive exposure: Never  Smokeless tobacco: Never   Tobacco comments:    "about 0.5 to 1 pack"  04/26/22.   Vaping Use   Vaping Use: Never used  Substance Use Topics   Alcohol use: Yes    Alcohol/week: 0.0 standard drinks of alcohol    Comment: rarely   Drug use: No    JessicaLeske reports that she has been smoking cigarettes. She has a 51.00 pack-year smoking history. She has never been exposed to tobacco smoke. She has never used smokeless tobacco. She reports current alcohol use. She reports that she does not use drugs.  Tobacco Cessation: Current every day smoker with a 51 pack year smoking history. Working hard in quitting  Past surgical hx, Family hx, Social hx all reviewed.  Current Outpatient Medications on File Prior to Visit  Medication Sig    Calcium-Vitamins C & D (CALCIUM/C/D PO) Take by mouth 2 (two) times daily. Good sense take one chew twice a day   fluticasone (FLONASE) 50 MCG/ACT nasal spray Place 1 spray into both nostrils 2 (two) times daily. (Patient taking differently: Place 1 spray into both nostrils as needed.)   Multiple Vitamin (MULTIVITAMIN ADULT PO) Take by mouth daily. [place] -multivitamin take one daily   rosuvastatin (CRESTOR) 20 MG tablet TAKE 1 TABLET BY MOUTH ONCE DAILY IN THE EVENING FOR  CHOLESTEROL   No current facility-administered medications on file prior to visit.     Allergies  Allergen Reactions   Chantix [Varenicline Tartrate] Other (See Comments)    Nausea,vomiting,spitting,when switched to higher dosage    Review Of Systems:  Constitutional:   No  weight loss, night sweats,  Fevers, chills, fatigue, or  lassitude.  HEENT:   No headaches,  Difficulty swallowing,  Tooth/dental problems, or  Sore throat,                No sneezing, itching, ear ache, nasal congestion, post nasal drip,   CV:  No chest pain,  Orthopnea, PND, swelling in lower extremities, anasarca, dizziness, palpitations, syncope.   GI  No heartburn, indigestion, abdominal pain, nausea, vomiting, diarrhea, change in bowel habits, loss of appetite, bloody stools.   Resp: No shortness of breath with exertion or at rest.  No excess mucus, no productive cough,  No non-productive cough,  No coughing up of blood.  No change in color of mucus.  No wheezing.  No chest wall deformity  Skin: no rash or lesions.  GU: no dysuria, change in color of urine, no urgency or frequency.  No flank pain, no hematuria   MS:  No joint pain or swelling.  No decreased range of motion.  No back pain.  Psych:  No change in mood or affect. No depression or anxiety.  No memory loss.   Vital Signs BP 116/74 (BP Location: Left Arm, Cuff Size: Normal)   Pulse 76   Temp 98.5 F (36.9 C) (Oral)   Ht 5\' 2"  (1.575 m)   Wt 146 lb (66.2 kg)   SpO2 96%    BMI 26.70 kg/m    Physical Exam:  General- No distress,  A&Ox3, very pleasant ENT: No sinus tenderness, TM clear, pale nasal mucosa, no oral exudate,no post nasal drip, no LAN Cardiac: S1, S2, regular rate and rhythm, no murmur Chest: No wheeze/ rales/ dullness; no accessory muscle use, no nasal flaring, no sternal retractions Abd.: Soft Non-tender, ND, BS +,Body mass index is 26.7 kg/m. Ext: No clubbing cyanosis, edema Neuro:  normal strength, MAE x 4, A&O x  3 Skin: No rashes, warm and dry, no lesions Psych: normal mood and behavior   Assessment/Plan RLL Pulmonary nodule with progressive growth over several years to 9.4 mm Minimal FDG avidity on PET>> possibly a very low-grade primary bronchogenic adenocarcinoma  Pt. Prefers to be aggressive and proceed with PFT's to see if she is a surgical candidate Plan We will schedule PFT's If they show you are a good candidate for surgery, we will refer you to [facility] for evaluation for surgical resection If you are not a good candidate for surgery, we will plan on biopsy and possible radiation treatment to treat.  Continue working on quitting smoking.  Follow up appointment with Judson Roch NP after PFT's to review Please contact office for sooner follow up if symptoms do not improve or worsen or seek emergency care    I spent 50 minutes dedicated to the care of this patient on the date of this encounter to include pre-visit review of records, face-to-face time with the patient discussing conditions above, post visit ordering of testing, clinical documentation with the electronic health record, making appropriate referrals as documented, and communicating necessary information to the patient's healthcare team.    Magdalen Spatz, NP 04/26/2022  10:47 AM

## 2022-04-30 NOTE — Telephone Encounter (Signed)
Spoke to pt, she is aware that it is a guided US biopsy and she can call GI to get it scheduled.

## 2022-05-03 ENCOUNTER — Other Ambulatory Visit: Payer: Self-pay

## 2022-05-03 DIAGNOSIS — R911 Solitary pulmonary nodule: Secondary | ICD-10-CM

## 2022-05-06 ENCOUNTER — Ambulatory Visit (INDEPENDENT_AMBULATORY_CARE_PROVIDER_SITE_OTHER): Payer: Medicare Other | Admitting: Emergency Medicine

## 2022-05-06 DIAGNOSIS — R911 Solitary pulmonary nodule: Secondary | ICD-10-CM

## 2022-05-06 LAB — PULMONARY FUNCTION TEST
DL/VA % pred: 66 %
DL/VA: 2.74 ml/min/mmHg/L
DLCO cor % pred: 52 %
DLCO cor: 10.19 ml/min/mmHg
DLCO unc % pred: 52 %
DLCO unc: 10.19 ml/min/mmHg
FEF 25-75 Post: 0.76 L/sec
FEF 25-75 Pre: 0.58 L/sec
FEF2575-%Change-Post: 31 %
FEF2575-%Pred-Post: 43 %
FEF2575-%Pred-Pre: 32 %
FEV1-%Change-Post: 11 %
FEV1-%Pred-Post: 51 %
FEV1-%Pred-Pre: 46 %
FEV1-Post: 1.13 L
FEV1-Pre: 1.01 L
FEV1FVC-%Change-Post: 4 %
FEV1FVC-%Pred-Pre: 84 %
FEV6-%Change-Post: -1 %
FEV6-%Pred-Post: 56 %
FEV6-%Pred-Pre: 57 %
FEV6-Post: 1.56 L
FEV6-Pre: 1.59 L
FEV6FVC-%Change-Post: 0 %
FEV6FVC-%Pred-Post: 104 %
FEV6FVC-%Pred-Pre: 104 %
FVC-%Change-Post: 6 %
FVC-%Pred-Post: 58 %
FVC-%Pred-Pre: 55 %
FVC-Post: 1.69 L
FVC-Pre: 1.59 L
Post FEV1/FVC ratio: 67 %
Post FEV6/FVC ratio: 100 %
Pre FEV1/FVC ratio: 64 %
Pre FEV6/FVC Ratio: 100 %
RV % pred: 138 %
RV: 3.1 L
TLC % pred: 97 %
TLC: 4.93 L

## 2022-05-06 NOTE — Progress Notes (Signed)
PFT done today. 

## 2022-05-09 ENCOUNTER — Other Ambulatory Visit (HOSPITAL_COMMUNITY)
Admission: RE | Admit: 2022-05-09 | Discharge: 2022-05-09 | Disposition: A | Discharge status: Home / Self Care | Payer: Medicare Other | Source: Ambulatory Visit | Attending: Primary Care | Admitting: Primary Care

## 2022-05-09 ENCOUNTER — Ambulatory Visit
Admission: RE | Admit: 2022-05-09 | Discharge: 2022-05-09 | Disposition: A | Discharge status: Home / Self Care | Payer: Medicare Other | Source: Ambulatory Visit | Attending: Primary Care | Admitting: Primary Care

## 2022-05-09 DIAGNOSIS — E041 Nontoxic single thyroid nodule: Secondary | ICD-10-CM | POA: Insufficient documentation

## 2022-05-09 DIAGNOSIS — E0789 Other specified disorders of thyroid: Secondary | ICD-10-CM | POA: Diagnosis not present

## 2022-05-10 LAB — CYTOLOGY - NON PAP

## 2022-05-15 DIAGNOSIS — E041 Nontoxic single thyroid nodule: Secondary | ICD-10-CM | POA: Diagnosis not present

## 2022-05-15 DIAGNOSIS — Z1231 Encounter for screening mammogram for malignant neoplasm of breast: Secondary | ICD-10-CM | POA: Diagnosis not present

## 2022-05-15 LAB — HM MAMMOGRAPHY

## 2022-05-17 ENCOUNTER — Ambulatory Visit (INDEPENDENT_AMBULATORY_CARE_PROVIDER_SITE_OTHER): Payer: Medicare Other | Admitting: Acute Care

## 2022-05-17 ENCOUNTER — Encounter: Payer: Self-pay | Admitting: Primary Care

## 2022-05-17 ENCOUNTER — Encounter: Payer: Self-pay | Admitting: Acute Care

## 2022-05-17 VITALS — BP 128/70 | HR 78 | Ht 63.0 in | Wt 147.0 lb

## 2022-05-17 DIAGNOSIS — R911 Solitary pulmonary nodule: Secondary | ICD-10-CM

## 2022-05-17 DIAGNOSIS — F17210 Nicotine dependence, cigarettes, uncomplicated: Secondary | ICD-10-CM

## 2022-05-17 DIAGNOSIS — F17200 Nicotine dependence, unspecified, uncomplicated: Secondary | ICD-10-CM

## 2022-05-17 MED ORDER — VARENICLINE TARTRATE 0.5 MG X 11 & 1 MG X 42 PO TBPK: ORAL_TABLET | ORAL | 0 refills | Status: DC

## 2022-05-17 NOTE — Patient Instructions (Addendum)
It is good to see you today. Your Pulmonary Function Tests are borderline for surgery. We will refer you to thoracic Surgery to be evaluated for surgery. You need to stop smoking to be considered for surgery.  I will send in a Chantix Starter pack.  Use the 0.5 mg tablets only. Start with one 0.5 mg tablet once daily x 3 days. Then increase to 0.5 mg twice daily x 4 days. Then cut the one mg tablets in half  and  continue  0.5 mg 2 times daily. If you have any adverse effects, stop at once and call me and let me know Do not take the 1 mg tablets as this is what caused problems in the past.  You should get a call from Thoracic surgery to schedule a consult. You should get a call from Radiation oncology to schedule a consult.  Follow up as needed.  Please contact office for sooner follow up if symptoms do not improve or worsen or seek emergency care

## 2022-05-17 NOTE — Progress Notes (Signed)
History of Present Illness Jessica [name] is a 71 y.o. female current every day smoker followed through the screening program. She is here for follow up of a  right lower lobe nodule which shows minimal growth over multiple studies dating back to 06/29/2020 when it measured 5.2 mm. This nodule now demonstrates threshold growth from 6.8 mm on 04/03/2021 to 9.4 mm .   05/17/2022 Pt. Presents for follow up after last appointment 04/26/2022 after abnormal CT Chest Lung Cancer Screening. She had a LDCT screening CT Chest whjich showed She had a PET scan that shows  minimal FDG avidity of the 9 mm RLL nodule, however given interval growth of the nodule, a very low-grade primary bronchogenic adenocarcinoma remains a pertinent differential consideration. Radiology recommend continued CT Chest surveillance. I spoke with Dr. Lamonte Sakai, He feels this nodule is a slow growing adenocarcinoma. He recommended speaking with the patient to see if she wants to be agressive or do a follow up Ct Chest.She wanted to be aggressive. We have done PFT's to determine if she is a good candidate for surgical option. Plan was to refer to Thoracic surgery vs radiation oncology based on PFT's.   Test Results: 04/15/2022 PET Scan Very low-level, similar to that of background lung parenchyma, FDG activity in the 9 mm right lower lobe pulmonary nodule on image 36/7 with a max SUV of 1.3. IMPRESSION: 1. Minimal FDG avidity in the 9 mm right lower lobe pulmonary nodule, similar to that of background lung parenchyma, which is nonspecific and typically indicating a benign nodule. However, given the nodules slow interval growth a very low-grade primary bronchogenic adenocarcinoma remains a pertinent differential consideration. Continued CT surveillance is suggested. 2. Asymmetric hypermetabolic thickening of the right glossotonsillar fold, nonspecific suggest correlation with direct visualization. 3.  Aortic Atherosclerosis  (ICD10-I70.0).   04/04/2022 CT Chest Lung Cancer Screening Centrilobular and paraseptal emphysema evident. Biapical pleuroparenchymal scarring evident. Multiple scattered tiny bilateral pulmonary nodules are stable in the interval. An additional right lower lobe nodule on image 139 shows minimal growth over multiple studies dating back to 06/29/2020 when it measured 5.2 mm. This nodule now demonstrates threshold growth from 6.8 mm on 04/03/2021 to 9.4 mm today. No focal airspace consolidation. No pleural effusion.   Lung-RADS 4B, suspicious. 9.4 mm right lower lobe pulmonary nodule has been minimally progressive over multiple prior studies but now shows threshold growth in 12 months consistent with Lung-Rads 4B category.       Latest Ref Rng & Units 06/06/2021   12:37 PM 01/11/2020   11:57 AM 08/01/2019    9:37 PM  CBC  WBC 4.0 - 10.5 K/uL 7.3  7.4  7.8   Hemoglobin 12.0 - 15.0 g/dL 14.5  14.2  13.8   Hematocrit 36.0 - 46.0 % 45.1  42.6  43.7   Platelets 150.0 - 400.0 K/uL 202.0  228.0  194        Latest Ref Rng & Units 06/06/2021   12:37 PM 04/21/2020   11:31 AM 01/11/2020   11:57 AM  BMP  Glucose 70 - 99 mg/dL 89  94  84   BUN 6 - 23 mg/dL 23  23  26    Creatinine 0.40 - 1.20 mg/dL 1.11  1.12  1.29   Sodium 135 - 145 mEq/L 141  141  140   Potassium 3.5 - 5.1 mEq/L 4.6  4.6  4.8   Chloride 96 - 112 mEq/L 102  104  106   CO2 19 -  32 mEq/L 29  30  28    Calcium 8.4 - 10.5 mg/dL 9.5  9.5  9.5     BNP No results found for: "BNP"  ProBNP No results found for: "PROBNP"  PFT    Component Value Date/Time   FEV1PRE 1.01 05/06/2022 1414   FEV1POST 1.13 05/06/2022 1414   FVCPRE 1.59 05/06/2022 1414   FVCPOST 1.69 05/06/2022 1414   TLC 4.93 05/06/2022 1414   DLCOUNC 10.19 05/06/2022 1414   PREFEV1FVCRT 64 05/06/2022 1414   PSTFEV1FVCRT 67 05/06/2022 1414    [place] FNA BX THYROID 1ST LESION AFIRMA  Result Date: 05/09/2022 INDICATION: Right medial/inferior thyroid nodule 1.6 cm  EXAM: ULTRASOUND GUIDED FINE NEEDLE ASPIRATION OF INDETERMINATE THYROID NODULE COMPARISON:  US Thyroid 04/22/22. MEDICATIONS: 5 cc 1% lidocaine COMPLICATIONS: None immediate. TECHNIQUE: Informed written consent was obtained from the patient after a discussion of the risks, benefits and alternatives to treatment. Questions regarding the procedure were encouraged and answered. A timeout was performed prior to the initiation of the procedure. Pre-procedural ultrasound scanning demonstrated unchanged size and appearance of the indeterminate nodule within the right thyroid The procedure was planned. The neck was prepped in the usual sterile fashion, and a sterile drape was applied covering the operative field. A timeout was performed prior to the initiation of the procedure. Local anesthesia was provided with 1% lidocaine. Under direct ultrasound guidance, 5 FNA biopsies were performed of the right medial/inferior thyroid nodule with a 27 gauge needle. 2 of these samples were obtained for [facility] Multiple ultrasound images were saved for procedural documentation purposes. The samples were prepared and submitted to pathology. Limited post procedural scanning was negative for hematoma or additional complication. Dressings were placed. The patient tolerated the above procedures procedure well without immediate postprocedural complication. FINDINGS: Nodule reference number based on prior diagnostic ultrasound: 4 Maximum size: 1.6 cm Location: Right; Inferior ACR TI-RADS risk category: TR4 (4-6 points) Reason for biopsy: meets ACR TI-RADS criteria Ultrasound imaging confirms appropriate placement of the needles within the thyroid nodule. IMPRESSION: Technically successful ultrasound guided fine needle aspiration of right medial/inferior thyroid nodule Read by Lavonia Drafts [facility] Electronically Signed   By: Miachel Roux M.D.   On: 05/09/2022 15:37   US THYROID  Result Date: 04/22/2022 CLINICAL DATA:  Multinodular goiter  EXAM: THYROID ULTRASOUND TECHNIQUE: Ultrasound examination of the thyroid gland and adjacent soft tissues was performed. COMPARISON:  01/19/2021 FINDINGS: Parenchymal Echotexture: Moderately heterogenous Isthmus: 0.4 cm thickness, previously 0.5 Right lobe: 4.6 x 1.4 x 2 cm, previously 4.8 x 1.5 x 1.6 Left lobe: 3.7 x 1.4 x 2.3 cm, previously 3.8 x 1.5 x 1.9 _________________________________________________________ Estimated total number of nodules >/= 1 cm: 6-10 Number of spongiform nodules >/=  2 cm not described below (TR1): 0 Number of mixed cystic and solid nodules >/= 1.5 cm not described below ([place]): 0 _________________________________________________________ Nodule # 1: Prior biopsy: No Location: Right; superior Maximum size: 1.2 cm; Other 2 dimensions: 1 x 0.6 cm, previously, 1 x 0.8 x 0.6 cm Composition: solid/almost completely solid (2) Echogenicity: hypoechoic (2) Shape: not taller-than-wide (0) Margins: ill-defined (0) Echogenic foci: none (0) ACR TI-RADS total points: 4. ACR TI-RADS risk category:  TR 4. Significant change in size (>/= 20% in two dimensions and minimal increase of 2 mm): No Change in features: No Change in ACR TI-RADS risk category: No ACR TI-RADS recommendations: *Given size (>/= 1 - 1.4 cm) and appearance, a follow-up ultrasound in 1 year should be considered based on TI-RADS  criteria. _________________________________________________________ Nodule # 2: Prior biopsy: No Location: Right; superior Maximum size: 1.4 cm; Other 2 dimensions: 1.2 x 0.8 cm, previously, 1.1 x 1 x 0.7 cm Composition: solid/almost completely solid (2) Echogenicity: hypoechoic (2) Shape: not taller-than-wide (0) Margins: ill-defined (0) Echogenic foci: none (0) ACR TI-RADS total points: 4. ACR TI-RADS risk category:  TR 4. Significant change in size (>/= 20% in two dimensions and minimal increase of 2 mm): No Change in features: No Change in ACR TI-RADS risk category: No ACR TI-RADS recommendations: *Given  size (>/= 1 - 1.4 cm) and appearance, a follow-up ultrasound in 1 year should be considered based on TI-RADS criteria. _________________________________________________________ Nodule 3: 0.8 cm isoechoic nodule without calcifications, mid right; This nodule does NOT meet TI-RADS criteria for biopsy or dedicated follow-up. Nodule # 4: Prior biopsy: No Location: Right; medial inferior Maximum size: 1.6 cm; Other 2 dimensions: 1.2 x 0.7 cm, previously, 1.4 x 1.1 x 0.7 cm Composition: solid/almost completely solid (2) Echogenicity: hypoechoic (2) Shape: not taller-than-wide (0) Margins: smooth (0) Echogenic foci: none (0) ACR TI-RADS total points: 4. ACR TI-RADS risk category:  TR 4. Significant change in size (>/= 20% in two dimensions and minimal increase of 2 mm): No Change in features: No Change in ACR TI-RADS risk category: No ACR TI-RADS recommendations: **Given size (>/= 1.5 cm) and appearance, fine needle aspiration of this moderately suspicious nodule should be considered based on TI-RADS criteria. _________________________________________________________ Nodule 5: 1 cm isoechoic superior left nodule without calcifications, stable; This nodule does NOT meet TI-RADS criteria for biopsy or dedicated follow-up. Nodule # 6: Prior biopsy: No Location: Left; mid Maximum size: 1.6 cm; Other 2 dimensions: 1.3 x 1.1 cm, previously, 1.4 x 1.3 x 0.8 cm Composition: solid/almost completely solid (2) Echogenicity: isoechoic (1) Shape: not taller-than-wide (0) Margins: ill-defined (0) Echogenic foci: none (0) ACR TI-RADS total points: 3. ACR TI-RADS risk category:  TR 3. Significant change in size (>/= 20% in two dimensions and minimal increase of 2 mm): No Change in features: No Change in ACR TI-RADS risk category: No ACR TI-RADS recommendations: *Given size (>/= 1.5 - 2.4 cm) and appearance, a follow-up ultrasound in 1 year should be considered based on TI-RADS criteria.  _________________________________________________________ Nodule # 7: Prior biopsy: No Location: Left; inferior Maximum size: 1.7 cm; Other 2 dimensions: 1.6 x 0.8 cm, previously, 1.6 x 1.1 x 1.2 cm Composition: solid/almost completely solid (2) Echogenicity: isoechoic (1) Shape: not taller-than-wide (0) Margins: ill-defined (0) Echogenic foci: none (0) ACR TI-RADS total points: 3. ACR TI-RADS risk category:  TR 3. Significant change in size (>/= 20% in two dimensions and minimal increase of 2 mm): No Change in features: No Change in ACR TI-RADS risk category: No ACR TI-RADS recommendations: *Given size (>/= 1.5 - 2.4 cm) and appearance, a follow-up ultrasound in 1 year should be considered based on TI-RADS criteria. _________________________________________________________ No regional cervical adenopathy identified. IMPRESSION: 1. Borderline thyromegaly with bilateral nodules. 2. Recommend FNA biopsy of moderately suspicious inferior right nodule. 3. Recommend annual/biennial ultrasound follow-up of additional nodules as above, until stability x5 years confirmed. The above is in keeping with the ACR TI-RADS recommendations - J Am Coll Radiol 2017;14:[id number]. Electronically Signed   By: Lucrezia Europe M.D.   On: 04/22/2022 16:28     Past medical hx Past Medical History:  Diagnosis Date   Allergy    seasonal allergies   Anemia    as a teenager   Arthrosis of right acromioclavicular joint 2015  Cataract    sx 2019   Chronic kidney disease    hx kidney stones   COPD (chronic obstructive pulmonary disease) (HCC)    infreq exac, continued tobacco abuse   Dyslipidemia    GERD (gastroesophageal reflux disease)    with certain foods   History of kidney stones    Hyperlipidemia    on meds   Osteoarthritis of knee    R knee   Seasonal allergies      Social History   Tobacco Use   Smoking status: Every Day    Packs/day: 1.00    Years: 51.00    Total pack years: 51.00    Types: Cigarettes     Passive exposure: Never   Smokeless tobacco: Never   Tobacco comments:    "about 0.5 to 1 pack"  04/26/22.   Vaping Use   Vaping Use: Never used  Substance Use Topics   Alcohol use: Yes    Alcohol/week: 0.0 standard drinks of alcohol    Comment: rarely   Drug use: No    Ms.Boschert reports that she has been smoking cigarettes. She has a 51.00 pack-year smoking history. She has never been exposed to tobacco smoke. She has never used smokeless tobacco. She reports current alcohol use. She reports that she does not use drugs.  Tobacco Cessation: Current Every Day smoker with a 51 pack year smoking history   Past surgical hx, Family hx, Social hx all reviewed.  Current Outpatient Medications on File Prior to Visit  Medication Sig   Calcium-Vitamins C & D (CALCIUM/C/D PO) Take by mouth 2 (two) times daily. Good sense take one chew twice a day   fluticasone (FLONASE) 50 MCG/ACT nasal spray Place 1 spray into both nostrils 2 (two) times daily. (Patient taking differently: Place 1 spray into both nostrils as needed.)   Multiple Vitamin (MULTIVITAMIN ADULT PO) Take by mouth daily. Sentry -multivitamin take one daily   rosuvastatin (CRESTOR) 20 MG tablet TAKE 1 TABLET BY MOUTH ONCE DAILY IN THE EVENING FOR  CHOLESTEROL   No current facility-administered medications on file prior to visit.     Allergies  Allergen Reactions   Chantix [Varenicline Tartrate] Other (See Comments)    Nausea,vomiting,spitting,when switched to higher dosage    Review Of Systems:  Constitutional:   No  weight loss, night sweats,  Fevers, chills, fatigue, or  lassitude.  HEENT:   No headaches,  Difficulty swallowing,  Tooth/dental problems, or  Sore throat,                No sneezing, itching, ear ache, nasal congestion, post nasal drip,   CV:  No chest pain,  Orthopnea, PND, swelling in lower extremities, anasarca, dizziness, palpitations, syncope.   GI  No heartburn, indigestion, abdominal pain, nausea,  vomiting, diarrhea, change in bowel habits, loss of appetite, bloody stools.   Resp: No shortness of breath with exertion or at rest.  No excess mucus, no productive cough,  No non-productive cough,  No coughing up of blood.  No change in color of mucus.  No wheezing.  No chest wall deformity  Skin: no rash or lesions.  GU: no dysuria, change in color of urine, no urgency or frequency.  No flank pain, no hematuria   MS:  No joint pain or swelling.  No decreased range of motion.  No back pain.  Psych:  No change in mood or affect. No depression or anxiety.  No memory loss.   Vital Signs BP  128/70   Pulse 78   Ht 5\' 3"  (1.6 m)   Wt 147 lb (66.7 kg)   SpO2 96%   BMI 26.04 kg/m    Physical Exam:  General- No distress,  A&Ox3 ENT: No sinus tenderness, TM clear, pale nasal mucosa, no oral exudate,no post nasal drip, no LAN Cardiac: S1, S2, regular rate and rhythm, no murmur Chest: No wheeze/ rales/ dullness; no accessory muscle use, no nasal flaring, no sternal retractions, slightly diminished per bases Abd.: Soft Non-tender, ND, BS +, Body mass index is 26.04 kg/m. Marland Kitchen  Ext: No clubbing cyanosis, edema Neuro:  normal strength, MAE x 4, A&O x 3 Skin: No rashes, warm and dry, no lesions  Psych: normal mood and behavior   Assessment/Plan Possible slow growing adenocarcinoma Pt. Prefers to be aggressive vs watchful waiting Continues to smoke daily Plan Your Pulmonary Function Tests are borderline for surgery. We will refer you to thoracic Surgery to be evaluated for surgery. You need to stop smoking to be considered for surgery.  I will send in a Chantix Starter pack.  Use the 0.5 mg tablets only. Start with one 0.5 mg tablet once daily x 3 days. Then increase to 0.5 mg twice daily x 4 days. Then cut the one mg tablets in half  and  continue  0.5 mg 2 times daily. If you have any adverse effects, stop at once and call me and let me know Do not take the 1 mg tablets as this is  what caused problems in the past.  You should get a call from Thoracic surgery to schedule a consult. You should get a call from Radiation oncology to schedule a consult.  Follow up as needed.  Please contact office for sooner follow up if symptoms do not improve or worsen or seek emergency care    I spent 40 minutes dedicated to the care of this patient on the date of this encounter to include pre-visit review of records, face-to-face time with the patient discussing conditions above, post visit ordering of testing, clinical documentation with the electronic health record, making appropriate referrals as documented, and communicating necessary information to the patient's healthcare team.      Magdalen Spatz, NP 05/17/2022  10:43 AM

## 2022-05-20 NOTE — Progress Notes (Signed)
Thoracic Location of Tumor / Histology: RLL nodule  Patient presented with the CT Chest Lung Cancer Screening program.  Tobacco/Marijuana/Snuff/ETOH use: current smoker - 1 ppd.  Past/Anticipated interventions by cardiothoracic surgery, if any: refer to thoracic surgery vs radiation oncology based on PFT's. Has appointment with Dr. Kipp Brood on 05/31/2022.   Past/Anticipated interventions by medical oncology, if any: no  Signs/Symptoms Weight changes, if any: no Respiratory complaints, if any: yes - has a productive cough with clear sputum. Hemoptysis, if any: no Pain issues, if any:  no  SAFETY ISSUES: Prior radiation? no Pacemaker/ICD? no  Possible current pregnancy?no Is the patient on methotrexate? no  Current Complaints / other details:  None noted.  BP (!) 116/58 (BP Location: Left Arm, Patient Position: Sitting, Cuff Size: Normal)   Pulse 86   Temp 98.9 F (37.2 C) (Oral)   Resp 17   Wt 150 lb 8 oz (68.3 kg)   SpO2 98%   BMI 26.66 kg/m

## 2022-05-23 ENCOUNTER — Ambulatory Visit
Admission: RE | Admit: 2022-05-23 | Discharge: 2022-05-23 | Disposition: A | Discharge status: Home / Self Care | Payer: Medicare Other | Source: Ambulatory Visit | Attending: Radiation Oncology | Admitting: Radiation Oncology

## 2022-05-23 ENCOUNTER — Encounter: Payer: Self-pay | Admitting: Radiation Oncology

## 2022-05-23 ENCOUNTER — Other Ambulatory Visit: Payer: Self-pay

## 2022-05-23 VITALS — BP 116/58 | HR 86 | Temp 98.9°F | Resp 17 | Wt 150.5 lb

## 2022-05-23 DIAGNOSIS — K219 Gastro-esophageal reflux disease without esophagitis: Secondary | ICD-10-CM | POA: Insufficient documentation

## 2022-05-23 DIAGNOSIS — J449 Chronic obstructive pulmonary disease, unspecified: Secondary | ICD-10-CM | POA: Diagnosis not present

## 2022-05-23 DIAGNOSIS — R911 Solitary pulmonary nodule: Secondary | ICD-10-CM | POA: Insufficient documentation

## 2022-05-23 DIAGNOSIS — M1990 Unspecified osteoarthritis, unspecified site: Secondary | ICD-10-CM | POA: Diagnosis not present

## 2022-05-23 DIAGNOSIS — Z87442 Personal history of urinary calculi: Secondary | ICD-10-CM | POA: Insufficient documentation

## 2022-05-23 DIAGNOSIS — E785 Hyperlipidemia, unspecified: Secondary | ICD-10-CM | POA: Diagnosis not present

## 2022-05-23 DIAGNOSIS — Z79899 Other long term (current) drug therapy: Secondary | ICD-10-CM | POA: Diagnosis not present

## 2022-05-23 DIAGNOSIS — F17210 Nicotine dependence, cigarettes, uncomplicated: Secondary | ICD-10-CM | POA: Insufficient documentation

## 2022-05-23 NOTE — Progress Notes (Signed)
Radiation Oncology         [phone] ________________________________  Initial Outpatient Consultation  Name: Jessica [name] MRN: [id number]  Date: 05/23/2022  DOB: [birth year]  WU:[id number], Leticia Penna, NP  Collene Gobble, MD   REFERRING PHYSICIAN: Collene Gobble, MD  DIAGNOSIS: The primary encounter diagnosis was Right lower lobe pulmonary nodule. A diagnosis of Solitary pulmonary nodule was also pertinent to this visit.  Right lower lobe nodule   HISTORY OF PRESENT ILLNESS::Jessica [name] is a 71 y.o. female, every day smoker with a 51.00 pack-year smoking history who is followed through the lung cancer screening program.. she is seen as a courtesy of Dr. Lamonte Sakai for an opinion concerning radiation therapy as part of management for her recently diagnosed right lung nodule.   The patient presented for a routine lung cancer screening CT on 04/04/22 which revealed a minimally progressive but suspicious 9.4 mm right lower lobe pulmonary nodule consistent with Lung-Rads 4B category. This was seen over multiple prior studies over the course of 12 months, but was seen with benign features on last chest CT dated 04/03/21.   PET scan on 04/15/22 further demonstrated minimal FDG avidity in the 9 mm right lower lobe pulmonary nodule, similar to that of background lung parenchyma. This finding was noted as nonspecific and typically indicative of a benign nodule. However, given the nodules slow interval growth, concern for a very low-grade primary bronchogenic adenocarcinoma could not be excluded. PET also showed nonspecific asymmetric hypermetabolic thickening of the right glossotonsillar fold.  Accordingly, the patient followed up with pulmonology, Eric Form NP, on 04/26/22 for further evaluation and management. Dr. Lamonte Sakai was consulted during this visit who reviewed imaging and noted concern for slow growing adenocarcinoma. Given so, the patient agreed to further workup consisting of PFT to  determine if she would be a good surgical candidate. From a respiratory standpoint, the patient was noted to deny any shortness of breath with exertion or at rest, mucus, cough, or wheezing.    Pulmonary function testing performed on 05/06/22 revealed borderline results for surgery. For consideration of surgery, Dr. Lamonte Sakai has referred the patient to thoracic surgery (scheduled to meet with Dr. Kipp Brood on 05/31/22). She was also given a chantix started pack to aid her in smoking cessation so that she may be considered for surgery.   Of note: Thyroid US on 04/22/22 for evaluation of a multinodular goiter showed borderline thyromegaly with bilateral nodules including a suspicious inferior right thyroid nodule. This prompted FNA on the inferior right thyroid nodule on 05/09/22 which revealed atypia of undetermined significance (bethesda category III).   Other recent pertinent imaging includes a bilateral screening mammogram on 05/15/22 which showed no evidence of malignancy in either breast.    PREVIOUS RADIATION THERAPY: No  PAST MEDICAL HISTORY:  Past Medical History:  Diagnosis Date   Allergy    seasonal allergies   Anemia    as a teenager   Arthrosis of right acromioclavicular joint 2015   Cataract    sx 2019   Chronic kidney disease    hx kidney stones   COPD (chronic obstructive pulmonary disease) (HCC)    infreq exac, continued tobacco abuse   Dyslipidemia    GERD (gastroesophageal reflux disease)    with certain foods   History of kidney stones    Hyperlipidemia    on meds   Osteoarthritis of knee    R knee   Seasonal allergies     PAST SURGICAL HISTORY: Past  Surgical History:  Procedure Laterality Date   ABDOMINAL HYSTERECTOMY  1992   APPENDECTOMY  1995   COLONOSCOPY     CYSTOSCOPY/URETEROSCOPY/HOLMIUM LASER/STENT PLACEMENT Right 08/03/2019   Procedure: CYSTOSCOPY/URETEROSCOPY/STENT PLACEMENT;  Surgeon: Robley Fries, MD;  Location: [facility];  Service: Urology;   Laterality: Right;  90 MINS   CYSTOSCOPY/URETEROSCOPY/HOLMIUM LASER/STENT PLACEMENT Right 08/17/2019   Procedure: CYSTOSCOPY/URETEROSCOPY/HOLMIUM LASER/STENT EXCHANGED;  Surgeon: Robley Fries, MD;  Location: [facility];  Service: Urology;  Laterality: Right;   MOUTH SURGERY  2011   dental implants   POLYPECTOMY     right knee surgery  1988   "torn cartledge"   1998/2002   ROTATOR CUFF REPAIR Right 2015   TONSILLECTOMY  1956   TUBAL LIGATION  1980   WISDOM TOOTH EXTRACTION  1970    FAMILY HISTORY:  Family History  Problem Relation Age of Onset   Hypertension Mother    Heart disease Mother    Colon polyps Mother 67   Multiple sclerosis Father    Cancer Brother        Pancreatic   Hypertension Brother    Breast cancer Maternal Aunt    Stroke Other    Colon cancer Neg Hx    Esophageal cancer Neg Hx    Rectal cancer Neg Hx    Stomach cancer Neg Hx    Crohn's disease Neg Hx     SOCIAL HISTORY:  Social History   Tobacco Use   Smoking status: Every Day    Packs/day: 1.00    Years: 51.00    Total pack years: 51.00    Types: Cigarettes    Passive exposure: Never   Smokeless tobacco: Never   Tobacco comments:    "about 0.5 to 1 pack"  04/26/22.   Vaping Use   Vaping Use: Never used  Substance Use Topics   Alcohol use: Yes    Alcohol/week: 0.0 standard drinks of alcohol    Comment: rarely   Drug use: No    ALLERGIES:  Allergies  Allergen Reactions   Chantix [Varenicline Tartrate] Other (See Comments)    Nausea,vomiting,spitting,when switched to higher dosage    MEDICATIONS:  Current Outpatient Medications  Medication Sig Dispense Refill   Calcium-Vitamins C & D (CALCIUM/C/D PO) Take by mouth 2 (two) times daily. Good sense take one chew twice a day     fluticasone (FLONASE) 50 MCG/ACT nasal spray Place 1 spray into both nostrils 2 (two) times daily. (Patient taking differently: Place 1 spray into both nostrils as needed.) 16 g 5   Multiple  Vitamin (MULTIVITAMIN ADULT PO) Take by mouth daily. Sentry -multivitamin take one daily     rosuvastatin (CRESTOR) 20 MG tablet TAKE 1 TABLET BY MOUTH ONCE DAILY IN THE EVENING FOR  CHOLESTEROL 90 tablet 3   varenicline (CHANTIX PAK) 0.5 MG X 11 & 1 MG X 42 tablet Take one 0.5 mg tablet by mouth once daily for 3 days, then increase to one 0.5 mg tablet twice daily for 4 days, then increase to one 1 mg tablet twice daily. 53 tablet 0   No current facility-administered medications for this encounter.    REVIEW OF SYSTEMS:  A 10+ POINT REVIEW OF SYSTEMS WAS OBTAINED including neurology, dermatology, psychiatry, cardiac, respiratory, lymph, extremities, GI, GU, musculoskeletal, constitutional, reproductive, HEENT.  She denies any pain within the chest area significant cough or hemoptysis.   PHYSICAL EXAM:  weight is 150 lb 8 oz (68.3 kg). Her oral temperature is 98.9  F (37.2 C). Her blood pressure is 116/58 (abnormal) and her pulse is 86. Her respiration is 17 and oxygen saturation is 98%.   General: Alert and oriented, in no acute distress HEENT: Head is normocephalic. Extraocular movements are intact.  Neck: Neck is supple, no palpable cervical or supraclavicular lymphadenopathy. Heart: Regular in rate and rhythm with no murmurs, rubs, or gallops. Chest: Clear to auscultation bilaterally, with occasional wheezing noted along the left lung field Abdomen: Soft, nontender, nondistended, with no rigidity or guarding. Extremities: No cyanosis or edema. Lymphatics: see Neck Exam Skin: No concerning lesions. Musculoskeletal: symmetric strength and muscle tone throughout. Neurologic: Cranial nerves II through XII are grossly intact. No obvious focalities. Speech is fluent. Coordination is intact. Psychiatric: Judgment and insight are intact. Affect is appropriate.   ECOG = 1  0 - Asymptomatic (Fully active, able to carry on all predisease activities without restriction)  1 - Symptomatic but  completely ambulatory (Restricted in physically strenuous activity but ambulatory and able to carry out work of a light or sedentary nature. For example, light housework, office work)  2 - Symptomatic, <50% in bed during the day (Ambulatory and capable of all self care but unable to carry out any work activities. Up and about more than 50% of waking hours)  3 - Symptomatic, >50% in bed, but not bedbound (Capable of only limited self-care, confined to bed or chair 50% or more of waking hours)  4 - Bedbound (Completely disabled. Cannot carry on any self-care. Totally confined to bed or chair)  5 - Death   Eustace Pen MM, Creech RH, Tormey DC, et al. [phone]). "Toxicity and response criteria of the [facility]". [place] Oncol. 5 (6): 649-55  LABORATORY DATA:  Lab Results  Component Value Date   WBC 7.3 06/06/2021   HGB 14.5 06/06/2021   HCT 45.1 06/06/2021   MCV 90.7 06/06/2021   PLT 202.0 06/06/2021   NEUTROABS 5.9 08/01/2019   Lab Results  Component Value Date   NA 141 06/06/2021   K 4.6 06/06/2021   CL 102 06/06/2021   CO2 29 06/06/2021   GLUCOSE 89 06/06/2021   BUN 23 06/06/2021   CREATININE 1.11 06/06/2021   CALCIUM 9.5 06/06/2021      RADIOGRAPHY: [place] FNA BX THYROID 1ST LESION AFIRMA  Result Date: 05/09/2022 INDICATION: Right medial/inferior thyroid nodule 1.6 cm EXAM: ULTRASOUND GUIDED FINE NEEDLE ASPIRATION OF INDETERMINATE THYROID NODULE COMPARISON:  US Thyroid 04/22/22. MEDICATIONS: 5 cc 1% lidocaine COMPLICATIONS: None immediate. TECHNIQUE: Informed written consent was obtained from the patient after a discussion of the risks, benefits and alternatives to treatment. Questions regarding the procedure were encouraged and answered. A timeout was performed prior to the initiation of the procedure. Pre-procedural ultrasound scanning demonstrated unchanged size and appearance of the indeterminate nodule within the right thyroid The procedure was planned. The  neck was prepped in the usual sterile fashion, and a sterile drape was applied covering the operative field. A timeout was performed prior to the initiation of the procedure. Local anesthesia was provided with 1% lidocaine. Under direct ultrasound guidance, 5 FNA biopsies were performed of the right medial/inferior thyroid nodule with a 27 gauge needle. 2 of these samples were obtained for [facility] Multiple ultrasound images were saved for procedural documentation purposes. The samples were prepared and submitted to pathology. Limited post procedural scanning was negative for hematoma or additional complication. Dressings were placed. The patient tolerated the above procedures procedure well without immediate postprocedural complication. FINDINGS:  Nodule reference number based on prior diagnostic ultrasound: 4 Maximum size: 1.6 cm Location: Right; Inferior ACR TI-RADS risk category: TR4 (4-6 points) Reason for biopsy: meets ACR TI-RADS criteria Ultrasound imaging confirms appropriate placement of the needles within the thyroid nodule. IMPRESSION: Technically successful ultrasound guided fine needle aspiration of right medial/inferior thyroid nodule Read by Lavonia Drafts [facility] Electronically Signed   By: Miachel Roux M.D.   On: 05/09/2022 15:37      IMPRESSION: Right lower lobe nodule   She has a solitary pulmonary nodule in the right lower lobe which is suspicious for slow-growing tumor such as adenocarcinoma.  We discussed general options for management including SBRT and surgery.  She has been taking Chantix and has been able to cut down on her smoking.  We discussed that surgery would have the best chance for cure assuming this is a malignancy and that SBRT would be also a very reasonable option if she could not have surgery or if she decides against surgery.  Based on the location of her lesion she would be a excellent candidate for 3 fractions which would give her the best chance for cure.  I discussed the  general course of SBRT treatments,  anticipated side effects and potential long-term toxicities.    PLAN: She will meet with Dr. Kipp Brood in early September to discuss surgery and to see if she would be a candidate for surgery.  I have asked her to call us once she has met with Dr. Kipp Brood with her decision either way.  If she does not proceed with surgery also discussed that it would be optimal for her to proceed with bronchoscopy and biopsy to confirm malignancy.  She would see Dr. Lamonte Sakai for this procedure.   60 minutes of total time was spent for this patient encounter, including preparation, face-to-face counseling with the patient and coordination of care, physical exam, and documentation of the encounter.   ------------------------------------------------  Blair Promise, PhD, MD  This document serves as a record of services personally performed by Gery Pray, MD. It was created on his behalf by Roney Mans, a trained medical scribe. The creation of this record is based on the scribe's personal observations and the provider's statements to them. This document has been checked and approved by the attending provider.

## 2022-05-27 ENCOUNTER — Encounter: Payer: Self-pay | Admitting: Acute Care

## 2022-05-28 ENCOUNTER — Encounter (HOSPITAL_COMMUNITY): Payer: Self-pay

## 2022-05-30 NOTE — Progress Notes (Signed)
[address]       [place] [address]             [phone]                    Jessica [name]  Medical Record #[id number] Date of Birth: 11/15/1950  Referring: Pleas Koch, NP Primary Care: Pleas Koch, NP Primary Cardiologist: None  Chief Complaint:    Chief Complaint  Patient presents with   Lung Lesion    Surgical consult, Chest CT 04/04/22/ PET Scan 04/15/22/ PFT's 05/06/22    History of Present Illness:    Jessica [name] 71 y.o. female presents for surgical evaluation of a right lower lobe pulmonary nodule.  She has a significant smoking history and continues to smoke currently.  She has cut down now that she is on Chantix.  She was a part of her lung cancer screening program.  She did undergo a PET/CT which showed scant activity however there has been some growth in the nodule.       Zubrod Score: At the time of surgery this patient's most appropriate activity status/level should be described as: [x]     0    Normal activity, no symptoms []     1    Restricted in physical strenuous activity but ambulatory, able to do out light work []     2    Ambulatory and capable of self care, unable to do work activities, up and about               >50 % of waking hours                              []     3    Only limited self care, in bed greater than 50% of waking hours []     4    Completely disabled, no self care, confined to bed or chair []     5    Moribund   Past Medical History:  Diagnosis Date   Allergy    seasonal allergies   Anemia    as a teenager   Arthrosis of right acromioclavicular joint 2015   Cataract    sx 2019   Chronic kidney disease    hx kidney stones   COPD (chronic obstructive pulmonary disease) ([place])    infreq exac, continued tobacco abuse   Dyslipidemia    GERD (gastroesophageal reflux disease)    with certain foods   History of kidney stones    Hyperlipidemia    on meds   Osteoarthritis of knee     R knee   Seasonal allergies     Past Surgical History:  Procedure Laterality Date   [place]   COLONOSCOPY     CYSTOSCOPY/URETEROSCOPY/HOLMIUM LASER/STENT PLACEMENT Right 08/03/2019   Procedure: CYSTOSCOPY/URETEROSCOPY/STENT PLACEMENT;  Surgeon: Robley Fries, MD;  Location: [facility];  Service: Urology;  Laterality: Right;  90 MINS   CYSTOSCOPY/URETEROSCOPY/HOLMIUM LASER/STENT PLACEMENT Right 08/17/2019   Procedure: CYSTOSCOPY/URETEROSCOPY/HOLMIUM LASER/STENT EXCHANGED;  Surgeon: Robley Fries, MD;  Location: [facility];  Service: Urology;  Laterality: Right;   MOUTH SURGERY  2011   dental implants   POLYPECTOMY     right knee surgery  1988   "torn cartledge"   1998/2002   ROTATOR CUFF REPAIR Right 2015   TONSILLECTOMY  1956   TUBAL LIGATION  1980   WISDOM TOOTH EXTRACTION  1970    Family History  Problem Relation Age of Onset   Hypertension Mother    Heart disease Mother    Colon polyps Mother 59   Multiple sclerosis Father    Cancer Brother        Pancreatic   Hypertension Brother    Breast cancer Maternal Aunt    Stroke Other    Colon cancer Neg Hx    Esophageal cancer Neg Hx    Rectal cancer Neg Hx    Stomach cancer Neg Hx    Crohn's disease Neg Hx      Social History   Tobacco Use  Smoking Status Every Day   Packs/day: 1.00   Years: 51.00   Total pack years: 51.00   Types: Cigarettes   Passive exposure: Never  Smokeless Tobacco Never  Tobacco Comments   "about 0.5 to 1 pack"  04/26/22.     Social History   Substance and Sexual Activity  Alcohol Use Yes   Alcohol/week: 0.0 standard drinks of alcohol   Comment: rarely     Allergies  Allergen Reactions   Chantix [Varenicline Tartrate] Other (See Comments)    Nausea,vomiting,spitting,when switched to higher dosage    Current Outpatient Medications  Medication Sig Dispense Refill   Calcium-Vitamins C & D (CALCIUM/C/D PO) Take by  mouth 2 (two) times daily. Good sense take one chew twice a day     fluticasone (FLONASE) 50 MCG/ACT nasal spray Place 1 spray into both nostrils 2 (two) times daily. (Patient taking differently: Place 1 spray into both nostrils as needed.) 16 g 5   Multiple Vitamin (MULTIVITAMIN ADULT PO) Take by mouth daily. Sentry -multivitamin take one daily     rosuvastatin (CRESTOR) 20 MG tablet TAKE 1 TABLET BY MOUTH ONCE DAILY IN THE EVENING FOR  CHOLESTEROL 90 tablet 3   varenicline (CHANTIX PAK) 0.5 MG X 11 & 1 MG X 42 tablet Take one 0.5 mg tablet by mouth once daily for 3 days, then increase to one 0.5 mg tablet twice daily for 4 days, then increase to one 1 mg tablet twice daily. 53 tablet 0   No current facility-administered medications for this visit.    Review of Systems  Constitutional:  Negative for malaise/fatigue and weight loss.  Respiratory:  Positive for cough. Negative for shortness of breath.   Cardiovascular:  Negative for chest pain.  Neurological: Negative.      PHYSICAL EXAMINATION: BP 99/63   Pulse 86   Resp 20   Ht 5\' 3"  (1.6 m)   Wt 150 lb (68 kg)   SpO2 96% Comment: RA  BMI 26.57 kg/m  Physical Exam Constitutional:      Appearance: Normal appearance. She is normal weight. She is not ill-appearing.  Eyes:     Extraocular Movements: Extraocular movements intact.  Cardiovascular:     Rate and Rhythm: Normal rate.  Pulmonary:     Effort: Pulmonary effort is normal. No respiratory distress.  Abdominal:     General: There is no distension.  Musculoskeletal:        General: Normal range of motion.     Cervical back: Normal range of motion.  Skin:    General: Skin is warm and dry.  Neurological:     General: No focal deficit present.     Mental Status: She is alert and oriented to person, place, and time.     Diagnostic  Studies & Laboratory data:     Recent Radiology Findings:   [place] FNA BX THYROID 1ST LESION AFIRMA  Result Date: 05/09/2022 INDICATION: Right  medial/inferior thyroid nodule 1.6 cm EXAM: ULTRASOUND GUIDED FINE NEEDLE ASPIRATION OF INDETERMINATE THYROID NODULE COMPARISON:  US Thyroid 04/22/22. MEDICATIONS: 5 cc 1% lidocaine COMPLICATIONS: None immediate. TECHNIQUE: Informed written consent was obtained from the patient after a discussion of the risks, benefits and alternatives to treatment. Questions regarding the procedure were encouraged and answered. A timeout was performed prior to the initiation of the procedure. Pre-procedural ultrasound scanning demonstrated unchanged size and appearance of the indeterminate nodule within the right thyroid The procedure was planned. The neck was prepped in the usual sterile fashion, and a sterile drape was applied covering the operative field. A timeout was performed prior to the initiation of the procedure. Local anesthesia was provided with 1% lidocaine. Under direct ultrasound guidance, 5 FNA biopsies were performed of the right medial/inferior thyroid nodule with a 27 gauge needle. 2 of these samples were obtained for [facility] Multiple ultrasound images were saved for procedural documentation purposes. The samples were prepared and submitted to pathology. Limited post procedural scanning was negative for hematoma or additional complication. Dressings were placed. The patient tolerated the above procedures procedure well without immediate postprocedural complication. FINDINGS: Nodule reference number based on prior diagnostic ultrasound: 4 Maximum size: 1.6 cm Location: Right; Inferior ACR TI-RADS risk category: TR4 (4-6 points) Reason for biopsy: meets ACR TI-RADS criteria Ultrasound imaging confirms appropriate placement of the needles within the thyroid nodule. IMPRESSION: Technically successful ultrasound guided fine needle aspiration of right medial/inferior thyroid nodule Read by Lavonia Drafts [facility] Electronically Signed   By: Miachel Roux M.D.   On: 05/09/2022 15:37       I have independently reviewed the  above radiology studies  and reviewed the findings with the patient.   Recent Lab Findings: Lab Results  Component Value Date   WBC 7.3 06/06/2021   HGB 14.5 06/06/2021   HCT 45.1 06/06/2021   PLT 202.0 06/06/2021   GLUCOSE 89 06/06/2021   CHOL 156 09/03/2021   TRIG 185.0 (H) 09/03/2021   HDL 49.50 09/03/2021   LDLDIRECT 172.0 04/20/2018   LDLCALC 70 09/03/2021   ALT 12 06/06/2021   AST 15 06/06/2021   NA 141 06/06/2021   K 4.6 06/06/2021   CL 102 06/06/2021   CREATININE 1.11 06/06/2021   BUN 23 06/06/2021   CO2 29 06/06/2021   TSH 1.65 06/06/2021   HGBA1C 5.4 04/21/2020     PFTs:  - FVC: 55% - FEV1: 46% -DLCO: 52%   Assessment / Plan:   71 year old female with a 9 mm right lower lobe pulmonary nodule.  She also has marginal lung function, and is currently smoking.  If she is able to quit smoking she potentially would be a candidate for right lower lobe superior segmentectomy she is at risk of quiring supplemental oxygen postoperatively she is not quite ready to make a decision.  If she elects to proceed with surgery then she will require a navigational bronchoscopy with biopsy and marking as a single anesthetic event with the surgery.      I  spent 40 minutes with  the patient face to face in counseling and coordination of care.    Lajuana Matte 05/31/2022 2:56 PM

## 2022-05-31 ENCOUNTER — Institutional Professional Consult (permissible substitution): Payer: Medicare Other | Admitting: Thoracic Surgery (Cardiothoracic Vascular Surgery)

## 2022-05-31 VITALS — BP 99/63 | HR 86 | Resp 20 | Ht 63.0 in | Wt 150.0 lb

## 2022-05-31 DIAGNOSIS — R911 Solitary pulmonary nodule: Secondary | ICD-10-CM

## 2022-06-10 ENCOUNTER — Telehealth: Payer: Self-pay

## 2022-06-10 NOTE — Telephone Encounter (Signed)
Received call from patient stating that she wants to proceed with radiation treatment to right lung.Patient states that she does not wish to have surgery to right lobe pulmonary nodule.  Patient questioned if she needs biopsy done prior to starting radiation treatment.

## 2022-06-11 ENCOUNTER — Other Ambulatory Visit: Payer: Self-pay

## 2022-06-11 ENCOUNTER — Telehealth: Payer: Self-pay | Admitting: *Deleted

## 2022-06-11 DIAGNOSIS — R911 Solitary pulmonary nodule: Secondary | ICD-10-CM

## 2022-06-11 NOTE — Telephone Encounter (Signed)
Received call from patient asking if she could cancel upcoming CT scan on 07/12/22 and follow up appointment with NP on 07/16/22. Per Dr. Sondra Come patient may cancel CT scan and follow up due to patient proceeding with radiation treatments.  Patient to follow up with Dr. Lamonte Sakai on 07/17/22 for biopsy.Patient called and made aware. Patient voiced understanding.

## 2022-06-11 NOTE — Telephone Encounter (Signed)
CALLED PATIENT TO INFORM OF APPT. WITH DR. Lamonte Sakai ON 07-17-22- ARRIVAL TIME- 3:30 PM - ADDRESS- [address], [address], PHONE NUMBER - [phone] (THIS APPT. IS FOR BRANCH BIOPSY), SPOKE WITH PATIENT AND SHE IS AWARE OF THIS APPT.

## 2022-07-12 ENCOUNTER — Other Ambulatory Visit (HOSPITAL_COMMUNITY): Payer: Medicare Other

## 2022-07-16 ENCOUNTER — Ambulatory Visit: Payer: Medicare Other | Admitting: Acute Care

## 2022-07-17 ENCOUNTER — Encounter: Payer: Self-pay | Admitting: Emergency Medicine

## 2022-07-17 ENCOUNTER — Ambulatory Visit: Payer: Medicare Other | Admitting: Emergency Medicine

## 2022-07-17 VITALS — BP 122/60 | HR 79 | Ht 62.0 in | Wt 151.0 lb

## 2022-07-17 DIAGNOSIS — R911 Solitary pulmonary nodule: Secondary | ICD-10-CM

## 2022-07-17 DIAGNOSIS — J439 Emphysema, unspecified: Secondary | ICD-10-CM

## 2022-07-17 DIAGNOSIS — Z720 Tobacco use: Secondary | ICD-10-CM | POA: Diagnosis not present

## 2022-07-17 NOTE — Patient Instructions (Signed)
We will work on arranging navigational bronchoscopy to evaluate your right lower lobe pulmonary nodule.  This will be done as an outpatient under general anesthesia at [facility] endoscopy.  You will need a designated driver.  We will try to get this set up for 08/05/2022 We will arrange for a repeat CT scan of your chest to use during your procedure. We reviewed your pulmonary function testing today.  We talked about possibly starting inhaled medication.  Since you are not having significant breathing symptoms we will hold off for now.  We may decide to revisit in the future. Congratulations on decreasing your cigarettes.  Keep working on this.  Ultimate goal will be to stop altogether. Follow with Dr. Lamonte Sakai in 1 month or next available.

## 2022-07-17 NOTE — Progress Notes (Signed)
Subjective:    Patient ID: Jessica [name], female    DOB: [birth year], 71 y.o.   MRN: [id number]  HPI 71 year old smoker (51 pack years) with a history of COPD, GERD, dyslipidemia, chronic kidney disease, seasonal allergies.  She participates in lung cancer screening program.  She has a 9 mm right lower lobe pulmonary nodule that has been increasing in size.  She was evaluated by Dr. Kipp Brood for consideration of surgical resection.  She has also been seen by Dr. Sondra Come with radiation oncology to discuss the option of SBRT. She reports that she has cough, bronchitis sx. She has been treated with abx   PET scan 04/15/2022 reviewed by me showed 9 mm right lower lobe pulmonary nodule with some very low level FDG activity.  No evidence of hypermetabolic thoracic adenopathy or any distant disease  PFT 05/06/22 reviewed by me shows severe obstruction without a bronchodilator response, borderline hyperinflation, decreased diffusion capacity that does not correct to the normal range when adjusted for alveolar volume.  Review of Systems As per HPI  Past Medical History:  Diagnosis Date   Allergy    seasonal allergies   Anemia    as a teenager   Arthrosis of right acromioclavicular joint 2015   Cataract    sx 2019   Chronic kidney disease    hx kidney stones   COPD (chronic obstructive pulmonary disease) (HCC)    infreq exac, continued tobacco abuse   Dyslipidemia    GERD (gastroesophageal reflux disease)    with certain foods   History of kidney stones    Hyperlipidemia    on meds   Osteoarthritis of knee    R knee   Seasonal allergies      Family History  Problem Relation Age of Onset   Hypertension Mother    Heart disease Mother    Colon polyps Mother 85   Multiple sclerosis Father    Cancer Brother        Pancreatic   Hypertension Brother    Breast cancer Maternal Aunt    Stroke Other    Colon cancer Neg Hx    Esophageal cancer Neg Hx    Rectal cancer Neg Hx     Stomach cancer Neg Hx    Crohn's disease Neg Hx      Social History   Socioeconomic History   Marital status: Married    Spouse name: Not on file   Number of children: Not on file   Years of education: Not on file   Highest education level: Not on file  Occupational History   Not on file  Tobacco Use   Smoking status: Every Day    Packs/day: 1.00    Years: 51.00    Total pack years: 51.00    Types: Cigarettes    Passive exposure: Current   Smokeless tobacco: Never   Tobacco comments:    About 3 cigarettes per day 07/17/2022 PAP  Vaping Use   Vaping Use: Never used  Substance and Sexual Activity   Alcohol use: Yes    Alcohol/week: 0.0 standard drinks of alcohol    Comment: rarely   Drug use: No   Sexual activity: Not on file  Other Topics Concern   Not on file  Social History Narrative   Not on file   Social Determinants of Health   Financial Resource Strain: Low Risk  (04/16/2022)   Overall Financial Resource Strain (CARDIA)    Difficulty of Paying Living Expenses: Not  hard at all  Food Insecurity: No Food Insecurity (04/16/2022)   Hunger Vital Sign    Worried About Running Out of Food in the Last Year: Never true    Ran Out of Food in the Last Year: Never true  Transportation Needs: No Transportation Needs (04/16/2022)   PRAPARE - Hydrologist (Medical): No    Lack of Transportation (Non-Medical): No  Physical Activity: Inactive (04/16/2022)   Exercise Vital Sign    Days of Exercise per Week: 0 days    Minutes of Exercise per Session: 0 min  Stress: No Stress Concern Present (04/16/2022)   [place]    Feeling of Stress : Not at all  Social Connections: Moderately Isolated (04/16/2022)   Social Connection and Isolation Panel [NHANES]    Frequency of Communication with Friends and Family: More than three times a week    Frequency of Social Gatherings with Friends and  Family: More than three times a week    Attends Religious Services: Never    Marine scientist or Organizations: No    Attends Archivist Meetings: Never    Marital Status: Married  Human resources officer Violence: Not At Risk (04/16/2022)   Humiliation, Afraid, Rape, and Kick questionnaire    Fear of Current or Ex-Partner: No    Emotionally Abused: No    Physically Abused: No    Sexually Abused: No     Allergies  Allergen Reactions   Chantix [Varenicline Tartrate] Other (See Comments)    Nausea,vomiting,spitting,when switched to higher dosage     Outpatient Medications Prior to Visit  Medication Sig Dispense Refill   Calcium-Vitamins C & D (CALCIUM/C/D PO) Take by mouth 2 (two) times daily. Good sense take one chew twice a day     fluticasone (FLONASE) 50 MCG/ACT nasal spray Place 1 spray into both nostrils 2 (two) times daily. (Patient taking differently: Place 1 spray into both nostrils as needed.) 16 g 5   Multiple Vitamin (MULTIVITAMIN ADULT PO) Take by mouth daily. Sentry -multivitamin take one daily     rosuvastatin (CRESTOR) 20 MG tablet TAKE 1 TABLET BY MOUTH ONCE DAILY IN THE EVENING FOR  CHOLESTEROL 90 tablet 3   varenicline (CHANTIX PAK) 0.5 MG X 11 & 1 MG X 42 tablet Take one 0.5 mg tablet by mouth once daily for 3 days, then increase to one 0.5 mg tablet twice daily for 4 days, then increase to one 1 mg tablet twice daily. 53 tablet 0   No facility-administered medications prior to visit.        Objective:   Physical Exam Vitals:   07/17/22 1532  BP: 122/60  Pulse: 79  SpO2: 99%  Weight: 151 lb (68.5 kg)  Height: 5\' 2"  (1.575 m)   Gen: Pleasant, well-nourished, in no distress,  normal affect  ENT: No lesions,  mouth clear,  oropharynx clear, no postnasal drip  Neck: No JVD, no stridor  Lungs: No use of accessory muscles, no crackles or wheezing on normal respiration, no wheeze on forced expiration  Cardiovascular: RRR, heart sounds normal, no  murmur or gallops, no peripheral edema  Musculoskeletal: No deformities, no cyanosis or clubbing  Neuro: alert, awake, non focal  Skin: Warm, no lesions or rash      Assessment & Plan:  Solitary pulmonary nodule Right lower lobe nodule, consistent with primary adenocarcinoma.  We will arrange for navigational bronchoscopy, biopsies to facilitate SBRT.  We will work on arranging navigational bronchoscopy to evaluate your right lower lobe pulmonary nodule.  This will be done as an outpatient under general anesthesia at [facility] endoscopy.  You will need a designated driver.  We will try to get this set up for 08/05/2022 We will arrange for a repeat CT scan of your chest to use during your procedure. Follow with Dr. Lamonte Sakai in 1 month or next available.  Tobacco abuse Congratulations on decreasing your cigarettes.  Keep working on this.  Ultimate goal will be to stop altogether.  COPD (chronic obstructive pulmonary disease) Severe obstruction on her pulmonary function testing but she does not have intrusive symptoms.  Good functional capacity.  She does get frequent bronchitis but is fine in between these episodes.  She may merit BD therapy.  We can revisit this going forward.   Baltazar Apo, MD, PhD 07/17/2022, 4:23 PM [facility] [phone] or if no answer before 7:00PM call [phone] For any issues after 7:00PM please call eLink [phone]

## 2022-07-17 NOTE — Assessment & Plan Note (Signed)
Severe obstruction on her pulmonary function testing but she does not have intrusive symptoms.  Good functional capacity.  She does get frequent bronchitis but is fine in between these episodes.  She may merit BD therapy.  We can revisit this going forward.

## 2022-07-17 NOTE — Assessment & Plan Note (Signed)
Congratulations on decreasing your cigarettes.  Keep working on this.  Ultimate goal will be to stop altogether.

## 2022-07-17 NOTE — H&P (View-Only) (Signed)
Subjective:    Patient ID: Jessica [name], female    DOB: 08-13-1951, 71 y.o.   MRN: [id number]  HPI 71 year old smoker (51 pack years) with a history of COPD, GERD, dyslipidemia, chronic kidney disease, seasonal allergies.  She participates in lung cancer screening program.  She has a 9 mm right lower lobe pulmonary nodule that has been increasing in size.  She was evaluated by Dr. Kipp Brood for consideration of surgical resection.  She has also been seen by Dr. Sondra Come with radiation oncology to discuss the option of SBRT. She reports that she has cough, bronchitis sx. She has been treated with abx   PET scan 04/15/2022 reviewed by me showed 9 mm right lower lobe pulmonary nodule with some very low level FDG activity.  No evidence of hypermetabolic thoracic adenopathy or any distant disease  PFT 05/06/22 reviewed by me shows severe obstruction without a bronchodilator response, borderline hyperinflation, decreased diffusion capacity that does not correct to the normal range when adjusted for alveolar volume.  Review of Systems As per HPI  Past Medical History:  Diagnosis Date   Allergy    seasonal allergies   Anemia    as a teenager   Arthrosis of right acromioclavicular joint 2015   Cataract    sx 2019   Chronic kidney disease    hx kidney stones   COPD (chronic obstructive pulmonary disease) (HCC)    infreq exac, continued tobacco abuse   Dyslipidemia    GERD (gastroesophageal reflux disease)    with certain foods   History of kidney stones    Hyperlipidemia    on meds   Osteoarthritis of knee    R knee   Seasonal allergies      Family History  Problem Relation Age of Onset   Hypertension Mother    Heart disease Mother    Colon polyps Mother 47   Multiple sclerosis Father    Cancer Brother        Pancreatic   Hypertension Brother    Breast cancer Maternal Aunt    Stroke Other    Colon cancer Neg Hx    Esophageal cancer Neg Hx    Rectal cancer Neg Hx     Stomach cancer Neg Hx    Crohn's disease Neg Hx      Social History   Socioeconomic History   Marital status: Married    Spouse name: Not on file   Number of children: Not on file   Years of education: Not on file   Highest education level: Not on file  Occupational History   Not on file  Tobacco Use   Smoking status: Every Day    Packs/day: 1.00    Years: 51.00    Total pack years: 51.00    Types: Cigarettes    Passive exposure: Current   Smokeless tobacco: Never   Tobacco comments:    About 3 cigarettes per day 07/17/2022 PAP  Vaping Use   Vaping Use: Never used  Substance and Sexual Activity   Alcohol use: Yes    Alcohol/week: 0.0 standard drinks of alcohol    Comment: rarely   Drug use: No   Sexual activity: Not on file  Other Topics Concern   Not on file  Social History Narrative   Not on file   Social Determinants of Health   Financial Resource Strain: Low Risk  (04/16/2022)   Overall Financial Resource Strain (CARDIA)    Difficulty of Paying Living Expenses: Not  hard at all  Food Insecurity: No Food Insecurity (04/16/2022)   Hunger Vital Sign    Worried About Running Out of Food in the Last Year: Never true    Ran Out of Food in the Last Year: Never true  Transportation Needs: No Transportation Needs (04/16/2022)   PRAPARE - Hydrologist (Medical): No    Lack of Transportation (Non-Medical): No  Physical Activity: Inactive (04/16/2022)   Exercise Vital Sign    Days of Exercise per Week: 0 days    Minutes of Exercise per Session: 0 min  Stress: No Stress Concern Present (04/16/2022)   [place]    Feeling of Stress : Not at all  Social Connections: Moderately Isolated (04/16/2022)   Social Connection and Isolation Panel [NHANES]    Frequency of Communication with Friends and Family: More than three times a week    Frequency of Social Gatherings with Friends and  Family: More than three times a week    Attends Religious Services: Never    Marine scientist or Organizations: No    Attends Archivist Meetings: Never    Marital Status: Married  Human resources officer Violence: Not At Risk (04/16/2022)   Humiliation, Afraid, Rape, and Kick questionnaire    Fear of Current or Ex-Partner: No    Emotionally Abused: No    Physically Abused: No    Sexually Abused: No     Allergies  Allergen Reactions   Chantix [Varenicline Tartrate] Other (See Comments)    Nausea,vomiting,spitting,when switched to higher dosage     Outpatient Medications Prior to Visit  Medication Sig Dispense Refill   Calcium-Vitamins C & D (CALCIUM/C/D PO) Take by mouth 2 (two) times daily. Good sense take one chew twice a day     fluticasone (FLONASE) 50 MCG/ACT nasal spray Place 1 spray into both nostrils 2 (two) times daily. (Patient taking differently: Place 1 spray into both nostrils as needed.) 16 g 5   Multiple Vitamin (MULTIVITAMIN ADULT PO) Take by mouth daily. Sentry -multivitamin take one daily     rosuvastatin (CRESTOR) 20 MG tablet TAKE 1 TABLET BY MOUTH ONCE DAILY IN THE EVENING FOR  CHOLESTEROL 90 tablet 3   varenicline (CHANTIX PAK) 0.5 MG X 11 & 1 MG X 42 tablet Take one 0.5 mg tablet by mouth once daily for 3 days, then increase to one 0.5 mg tablet twice daily for 4 days, then increase to one 1 mg tablet twice daily. 53 tablet 0   No facility-administered medications prior to visit.        Objective:   Physical Exam Vitals:   07/17/22 1532  BP: 122/60  Pulse: 79  SpO2: 99%  Weight: 151 lb (68.5 kg)  Height: 5\' 2"  (1.575 m)   Gen: Pleasant, well-nourished, in no distress,  normal affect  ENT: No lesions,  mouth clear,  oropharynx clear, no postnasal drip  Neck: No JVD, no stridor  Lungs: No use of accessory muscles, no crackles or wheezing on normal respiration, no wheeze on forced expiration  Cardiovascular: RRR, heart sounds normal, no  murmur or gallops, no peripheral edema  Musculoskeletal: No deformities, no cyanosis or clubbing  Neuro: alert, awake, non focal  Skin: Warm, no lesions or rash      Assessment & Plan:  Solitary pulmonary nodule Right lower lobe nodule, consistent with primary adenocarcinoma.  We will arrange for navigational bronchoscopy, biopsies to facilitate SBRT.  We will work on arranging navigational bronchoscopy to evaluate your right lower lobe pulmonary nodule.  This will be done as an outpatient under general anesthesia at [facility] endoscopy.  You will need a designated driver.  We will try to get this set up for 08/05/2022 We will arrange for a repeat CT scan of your chest to use during your procedure. Follow with Dr. Lamonte Sakai in 1 month or next available.  Tobacco abuse Congratulations on decreasing your cigarettes.  Keep working on this.  Ultimate goal will be to stop altogether.  COPD (chronic obstructive pulmonary disease) Severe obstruction on her pulmonary function testing but she does not have intrusive symptoms.  Good functional capacity.  She does get frequent bronchitis but is fine in between these episodes.  She may merit BD therapy.  We can revisit this going forward.   Baltazar Apo, MD, PhD 07/17/2022, 4:23 PM [facility] [phone] or if no answer before 7:00PM call [phone] For any issues after 7:00PM please call eLink [phone]

## 2022-07-17 NOTE — Assessment & Plan Note (Signed)
Right lower lobe nodule, consistent with primary adenocarcinoma.  We will arrange for navigational bronchoscopy, biopsies to facilitate SBRT.  We will work on arranging navigational bronchoscopy to evaluate your right lower lobe pulmonary nodule.  This will be done as an outpatient under general anesthesia at [facility] endoscopy.  You will need a designated driver.  We will try to get this set up for 08/05/2022 We will arrange for a repeat CT scan of your chest to use during your procedure. Follow with Dr. Lamonte Sakai in 1 month or next available.

## 2022-07-25 ENCOUNTER — Telehealth: Payer: Self-pay | Admitting: Primary Care

## 2022-07-25 DIAGNOSIS — I700 Atherosclerosis of aorta: Secondary | ICD-10-CM

## 2022-07-25 DIAGNOSIS — E785 Hyperlipidemia, unspecified: Secondary | ICD-10-CM

## 2022-07-26 ENCOUNTER — Ambulatory Visit (HOSPITAL_BASED_OUTPATIENT_CLINIC_OR_DEPARTMENT_OTHER): Payer: Medicare Other

## 2022-07-26 NOTE — Telephone Encounter (Signed)
Patient scheduled for her CPE on 08/01/2022 at 11:00 am.

## 2022-07-26 NOTE — Telephone Encounter (Signed)
Patient is due for CPE/follow up now, this will be required prior to any further refills.  Please schedule.

## 2022-08-01 ENCOUNTER — Ambulatory Visit (HOSPITAL_COMMUNITY)
Admission: RE | Admit: 2022-08-01 | Discharge: 2022-08-01 | Disposition: A | Discharge status: Home / Self Care | Payer: Medicare Other | Source: Ambulatory Visit | Attending: Emergency Medicine | Admitting: Emergency Medicine

## 2022-08-01 ENCOUNTER — Encounter (HOSPITAL_COMMUNITY): Payer: Self-pay | Admitting: Emergency Medicine

## 2022-08-01 ENCOUNTER — Other Ambulatory Visit: Payer: Medicare Other

## 2022-08-01 DIAGNOSIS — R911 Solitary pulmonary nodule: Secondary | ICD-10-CM

## 2022-08-01 DIAGNOSIS — R918 Other nonspecific abnormal finding of lung field: Secondary | ICD-10-CM | POA: Diagnosis not present

## 2022-08-01 DIAGNOSIS — J439 Emphysema, unspecified: Secondary | ICD-10-CM | POA: Diagnosis not present

## 2022-08-02 ENCOUNTER — Encounter (HOSPITAL_COMMUNITY): Payer: Self-pay | Admitting: Emergency Medicine

## 2022-08-02 ENCOUNTER — Other Ambulatory Visit (INDEPENDENT_AMBULATORY_CARE_PROVIDER_SITE_OTHER): Payer: Medicare Other

## 2022-08-02 ENCOUNTER — Other Ambulatory Visit: Payer: Self-pay

## 2022-08-02 ENCOUNTER — Encounter: Payer: Self-pay | Admitting: Primary Care

## 2022-08-02 ENCOUNTER — Ambulatory Visit (INDEPENDENT_AMBULATORY_CARE_PROVIDER_SITE_OTHER): Payer: Medicare Other | Admitting: Primary Care

## 2022-08-02 VITALS — BP 136/78 | HR 73 | Temp 98.6°F | Ht 62.0 in | Wt 152.0 lb

## 2022-08-02 DIAGNOSIS — I700 Atherosclerosis of aorta: Secondary | ICD-10-CM | POA: Diagnosis not present

## 2022-08-02 DIAGNOSIS — R911 Solitary pulmonary nodule: Secondary | ICD-10-CM

## 2022-08-02 DIAGNOSIS — E785 Hyperlipidemia, unspecified: Secondary | ICD-10-CM | POA: Diagnosis not present

## 2022-08-02 DIAGNOSIS — D126 Benign neoplasm of colon, unspecified: Secondary | ICD-10-CM | POA: Diagnosis not present

## 2022-08-02 DIAGNOSIS — K219 Gastro-esophageal reflux disease without esophagitis: Secondary | ICD-10-CM | POA: Diagnosis not present

## 2022-08-02 DIAGNOSIS — J439 Emphysema, unspecified: Secondary | ICD-10-CM | POA: Diagnosis not present

## 2022-08-02 DIAGNOSIS — E042 Nontoxic multinodular goiter: Secondary | ICD-10-CM | POA: Diagnosis not present

## 2022-08-02 DIAGNOSIS — M810 Age-related osteoporosis without current pathological fracture: Secondary | ICD-10-CM

## 2022-08-02 DIAGNOSIS — Z0000 Encounter for general adult medical examination without abnormal findings: Secondary | ICD-10-CM | POA: Diagnosis not present

## 2022-08-02 MED ORDER — ROSUVASTATIN CALCIUM 20 MG PO TABS: ORAL_TABLET | ORAL | 3 refills | Status: DC

## 2022-08-02 NOTE — Progress Notes (Signed)
Subjective:    Patient ID: Jessica [name], female    DOB: 1951/04/21, 71 y.o.   MRN: [id number]  HPI  Jessica [name] is a very pleasant 71 y.o. female who presents today for complete physical and follow up of chronic conditions.  Immunizations: -Tetanus: 2013 -Influenza: Due today, declines today, will obtain at pharmacy  -Shingles: Zostavax -Pneumonia: Prevnar 13 in 2016, Pneumovax 23 in 2021  Diet: [place].  Exercise: No regular exercise.  Eye exam: Completes annually  Dental exam: Completed years ago   Mammogram: Completed in August 2023  Colonoscopy: Completed in 2023, no further colonoscopy recommended given narrowed lumen to the left colon.  Lung Cancer Screening: Completed in July 2023, follow-up CT completed yesterday. Dexa: Completed in 2022        Review of Systems  Constitutional:  Negative for unexpected weight change.  HENT:  Negative for rhinorrhea.   Respiratory:  Negative for cough and shortness of breath.   Cardiovascular:  Negative for chest pain.  Gastrointestinal:  Negative for constipation and diarrhea.  Genitourinary:  Negative for difficulty urinating.  Musculoskeletal:  Positive for arthralgias.  Skin:  Negative for rash.  Allergic/Immunologic: Negative for environmental allergies.  Neurological:  Negative for dizziness and headaches.  Psychiatric/Behavioral:  The patient is not nervous/anxious.          Past Medical History:  Diagnosis Date   Allergy    seasonal allergies   Anemia    as a teenager   Arthrosis of right acromioclavicular joint 2015   Cataract    sx 2019   COPD (chronic obstructive pulmonary disease) (HCC)    infreq exac, continued tobacco abuse   Dyslipidemia    GERD (gastroesophageal reflux disease)    with certain foods   History of kidney stones    Hyperlipidemia    on meds   Kidney stone on right side 07/29/2019   In ureter  With hydronephrosis   CT in epic   symptomatic   Microscopic hematuria  07/28/2019   Osteoarthritis of knee    R knee   Seasonal allergies     Social History   Socioeconomic History   Marital status: Married    Spouse name: Not on file   Number of children: Not on file   Years of education: Not on file   Highest education level: Not on file  Occupational History   Not on file  Tobacco Use   Smoking status: Every Day    Packs/day: 1.00    Years: 51.00    Total pack years: 51.00    Types: Cigarettes    Passive exposure: Current   Smokeless tobacco: Never   Tobacco comments:    About 3 cigarettes per day 07/17/2022 PAP  Vaping Use   Vaping Use: Never used  Substance and Sexual Activity   Alcohol use: Yes    Alcohol/week: 0.0 standard drinks of alcohol    Comment: rarely   Drug use: No   Sexual activity: Not on file  Other Topics Concern   Not on file  Social History Narrative   Not on file   Social Determinants of Health   Financial Resource Strain: Low Risk  (04/16/2022)   Overall Financial Resource Strain (CARDIA)    Difficulty of Paying Living Expenses: Not hard at all  Food Insecurity: No Food Insecurity (04/16/2022)   Hunger Vital Sign    Worried About Running Out of Food in the Last Year: Never true    Ran  Out of Food in the Last Year: Never true  Transportation Needs: No Transportation Needs (04/16/2022)   PRAPARE - Hydrologist (Medical): No    Lack of Transportation (Non-Medical): No  Physical Activity: Inactive (04/16/2022)   Exercise Vital Sign    Days of Exercise per Week: 0 days    Minutes of Exercise per Session: 0 min  Stress: No Stress Concern Present (04/16/2022)   [place]    Feeling of Stress : Not at all  Social Connections: Moderately Isolated (04/16/2022)   Social Connection and Isolation Panel [NHANES]    Frequency of Communication with Friends and Family: More than three times a week    Frequency of Social Gatherings  with Friends and Family: More than three times a week    Attends Religious Services: Never    Marine scientist or Organizations: No    Attends Archivist Meetings: Never    Marital Status: Married  Human resources officer Violence: Not At Risk (04/16/2022)   Humiliation, Afraid, Rape, and Kick questionnaire    Fear of Current or Ex-Partner: No    Emotionally Abused: No    Physically Abused: No    Sexually Abused: No    Past Surgical History:  Procedure Laterality Date   [place]   COLONOSCOPY     CYSTOSCOPY/URETEROSCOPY/HOLMIUM LASER/STENT PLACEMENT Right 08/03/2019   Procedure: CYSTOSCOPY/URETEROSCOPY/STENT PLACEMENT;  Surgeon: Robley Fries, MD;  Location: [facility];  Service: Urology;  Laterality: Right;  90 MINS   CYSTOSCOPY/URETEROSCOPY/HOLMIUM LASER/STENT PLACEMENT Right 08/17/2019   Procedure: CYSTOSCOPY/URETEROSCOPY/HOLMIUM LASER/STENT EXCHANGED;  Surgeon: Robley Fries, MD;  Location: [facility];  Service: Urology;  Laterality: Right;   MOUTH SURGERY  2011   dental implants   POLYPECTOMY     right knee surgery  1988   "torn cartledge"   1998/2002   ROTATOR CUFF REPAIR Right 2015   TONSILLECTOMY  1956   TUBAL LIGATION  1980   WISDOM TOOTH EXTRACTION  1970    Family History  Problem Relation Age of Onset   Hypertension Mother    Heart disease Mother    Colon polyps Mother 69   Multiple sclerosis Father    Cancer Brother        Pancreatic   Hypertension Brother    Breast cancer Maternal Aunt    Stroke Other    Colon cancer Neg Hx    Esophageal cancer Neg Hx    Rectal cancer Neg Hx    Stomach cancer Neg Hx    Crohn's disease Neg Hx     Allergies  Allergen Reactions   Chantix [Varenicline Tartrate] Other (See Comments)    Nausea,vomiting,spitting,when switched to higher dosage    Current Outpatient Medications on File Prior to Visit  Medication Sig Dispense Refill   Calcium-Vitamins C  & D (CALCIUM/C/D PO) Take 1 each by mouth 2 (two) times daily. Good sense 1 chew twice a day     fluticasone (FLONASE) 50 MCG/ACT nasal spray Place 1 spray into both nostrils 2 (two) times daily. (Patient taking differently: Place 1 spray into both nostrils daily as needed for allergies.) 16 g 5   Multiple Vitamin (MULTIVITAMIN ADULT PO) Take 1 tablet by mouth daily. [place] -multivitamin take one daily     No current facility-administered medications on file prior to visit.    BP 136/78   Pulse 73  Temp 98.6 F (37 C) (Temporal)   Ht 5\' 2"  (1.575 m)   Wt 152 lb (68.9 kg)   SpO2 98%   BMI 27.80 kg/m  Objective:   Physical Exam HENT:     Right Ear: Tympanic membrane and ear canal normal.     Left Ear: Tympanic membrane and ear canal normal.     Nose: Nose normal.  Eyes:     Conjunctiva/sclera: Conjunctivae normal.     Pupils: Pupils are equal, round, and reactive to light.  Neck:     Thyroid: No thyromegaly.  Cardiovascular:     Rate and Rhythm: Normal rate and regular rhythm.     Heart sounds: No murmur heard. Pulmonary:     Effort: Pulmonary effort is normal.     Breath sounds: Normal breath sounds. No rales.  Abdominal:     General: Bowel sounds are normal.     Palpations: Abdomen is soft.     Tenderness: There is no abdominal tenderness.  Musculoskeletal:        General: Normal range of motion.     Cervical back: Neck supple.  Lymphadenopathy:     Cervical: No cervical adenopathy.  Skin:    General: Skin is warm and dry.     Findings: No rash.  Neurological:     Mental Status: She is alert and oriented to person, place, and time.     Cranial Nerves: No cranial nerve deficit.     Deep Tendon Reflexes: Reflexes are normal and symmetric.  Psychiatric:        Mood and Affect: Mood normal.           Assessment & Plan:   Problem List Items Addressed This Visit       Respiratory   COPD (chronic obstructive pulmonary disease) ([place])    Controlled.  No  concerns today. Following with pulmonology, office notes reviewed from October 2023.         Digestive   GERD (gastroesophageal reflux disease)    Controlled.  Not on treatment. Manages with avoid of triggers.      Colon polyps    Reviewed colonoscopy from April 2023. No further testing recommended given her narrow left colon.        Endocrine   Multiple thyroid nodules    Reviewed thyroid ultrasound report and thyroid biopsy report with patient today. Repeat thyroid ultrasound in July 2024. Discussed this with patient today.         Musculoskeletal and Integument   Osteoporosis without current pathological fracture    She did not complete bone density scan last year. Orders placed again. She will complete.         Other   Dyslipidemia    She resumed rosuvastatin 20 mg three days ago.  Continue rosuvastatin 20 mg daily. Repeat lipids in 2 months.      Relevant Medications   rosuvastatin (CRESTOR) 20 MG tablet   Other Relevant Orders   Comprehensive metabolic panel   Routine general medical examination at a health care facility - Primary    Immunizations UTD. Declines influenza vaccine, will obtain at pharmacy. Discussed Shingrix. Mammogram and bone density scans UTD. Colonoscopy UTD, no further imaging required.   Discussed the importance of a healthy diet and regular exercise in order for weight loss, and to reduce the risk of further co-morbidity.  Exam stable. Labs pending.  Follow up in 1 year for repeat physical.       Solitary pulmonary nodule  Following with pulmonology, office notes reviewed from October 2023. CT chest pending. Lung biopsy scheduled for next week.       Other Visit Diagnoses     Aortic atherosclerosis ([place])       Relevant Medications   rosuvastatin (CRESTOR) 20 MG tablet          Pleas Koch, NP

## 2022-08-02 NOTE — Assessment & Plan Note (Signed)
Controlled.  No concerns today. Following with pulmonology, office notes reviewed from October 2023.

## 2022-08-02 NOTE — Assessment & Plan Note (Signed)
She resumed rosuvastatin 20 mg three days ago.  Continue rosuvastatin 20 mg daily. Repeat lipids in 2 months.

## 2022-08-02 NOTE — Patient Instructions (Signed)
Stop by the lab prior to leaving today. I will notify you of your results once received.   Schedule a lab only appointment for 2 months to check cholesterol.  It was a pleasure to see you today!  Preventive Care 21 Years and Older, Female Preventive care refers to lifestyle choices and visits with your health care provider that can promote health and wellness. Preventive care visits are also called wellness exams. What can I expect for my preventive care visit? Counseling Your health care provider may ask you questions about your: Medical history, including: Past medical problems. Family medical history. Pregnancy and menstrual history. History of falls. Current health, including: Memory and ability to understand (cognition). Emotional well-being. Home life and relationship well-being. Sexual activity and sexual health. Lifestyle, including: Alcohol, nicotine or tobacco, and drug use. Access to firearms. Diet, exercise, and sleep habits. Work and work Statistician. Sunscreen use. Safety issues such as seatbelt and bike helmet use. Physical exam Your health care provider will check your: Height and weight. These may be used to calculate your BMI (body mass index). BMI is a measurement that tells if you are at a healthy weight. Waist circumference. This measures the distance around your waistline. This measurement also tells if you are at a healthy weight and may help predict your risk of certain diseases, such as type 2 diabetes and high blood pressure. Heart rate and blood pressure. Body temperature. Skin for abnormal spots. What immunizations do I need?  Vaccines are usually given at various ages, according to a schedule. Your health care provider will recommend vaccines for you based on your age, medical history, and lifestyle or other factors, such as travel or where you work. What tests do I need? Screening Your health care provider may recommend screening tests for certain  conditions. This may include: Lipid and cholesterol levels. Hepatitis C test. Hepatitis B test. HIV (human immunodeficiency virus) test. STI (sexually transmitted infection) testing, if you are at risk. Lung cancer screening. Colorectal cancer screening. Diabetes screening. This is done by checking your blood sugar (glucose) after you have not eaten for a while (fasting). Mammogram. Talk with your health care provider about how often you should have regular mammograms. BRCA-related cancer screening. This may be done if you have a family history of breast, ovarian, tubal, or peritoneal cancers. Bone density scan. This is done to screen for osteoporosis. Talk with your health care provider about your test results, treatment options, and if necessary, the need for more tests. Follow these instructions at home: Eating and drinking  Eat a diet that includes fresh fruits and vegetables, whole grains, lean protein, and low-fat dairy products. Limit your intake of foods with high amounts of sugar, saturated fats, and salt. Take vitamin and mineral supplements as recommended by your health care provider. Do not drink alcohol if your health care provider tells you not to drink. If you drink alcohol: Limit how much you have to 0-1 drink a day. Know how much alcohol is in your drink. In the [place] one drink equals one 12 oz bottle of beer (355 mL), one 5 oz glass of wine (148 mL), or one 1 oz glass of hard liquor (44 mL). Lifestyle Brush your teeth every morning and night with fluoride toothpaste. Floss one time each day. Exercise for at least 30 minutes 5 or more days each week. Do not use any products that contain nicotine or tobacco. These products include cigarettes, chewing tobacco, and vaping devices, such as e-cigarettes. If  you need help quitting, ask your health care provider. Do not use drugs. If you are sexually active, practice safe sex. Use a condom or other form of protection in order to  prevent STIs. Take aspirin only as told by your health care provider. Make sure that you understand how much to take and what form to take. Work with your health care provider to find out whether it is safe and beneficial for you to take aspirin daily. Ask your health care provider if you need to take a cholesterol-lowering medicine (statin). Find healthy ways to manage stress, such as: Meditation, yoga, or listening to music. Journaling. Talking to a trusted person. Spending time with friends and family. Minimize exposure to UV radiation to reduce your risk of skin cancer. Safety Always wear your seat belt while driving or riding in a vehicle. Do not drive: If you have been drinking alcohol. Do not ride with someone who has been drinking. When you are tired or distracted. While texting. If you have been using any mind-altering substances or drugs. Wear a helmet and other protective equipment during sports activities. If you have firearms in your house, make sure you follow all gun safety procedures. What's next? Visit your health care provider once a year for an annual wellness visit. Ask your health care provider how often you should have your eyes and teeth checked. Stay up to date on all vaccines. This information is not intended to replace advice given to you by your health care provider. Make sure you discuss any questions you have with your health care provider. Document Revised: 03/14/2021 Document Reviewed: 03/14/2021 Elsevier Patient Education  [place].

## 2022-08-02 NOTE — Assessment & Plan Note (Signed)
Reviewed thyroid ultrasound report and thyroid biopsy report with patient today. Repeat thyroid ultrasound in July 2024. Discussed this with patient today.

## 2022-08-02 NOTE — Assessment & Plan Note (Signed)
Following with pulmonology, office notes reviewed from October 2023. CT chest pending. Lung biopsy scheduled for next week.

## 2022-08-02 NOTE — Assessment & Plan Note (Signed)
Controlled.  Not on treatment. Manages with avoid of triggers.

## 2022-08-02 NOTE — Assessment & Plan Note (Signed)
Immunizations UTD. Declines influenza vaccine, will obtain at pharmacy. Discussed Shingrix. Mammogram and bone density scans UTD. Colonoscopy UTD, no further imaging required.   Discussed the importance of a healthy diet and regular exercise in order for weight loss, and to reduce the risk of further co-morbidity.  Exam stable. Labs pending.  Follow up in 1 year for repeat physical.

## 2022-08-02 NOTE — Progress Notes (Signed)
Spoke with pt for pre-op call. Pt denies cardiac history, HTN or Diabetes.   Shower instructions given to pt and she voiced understanding.  

## 2022-08-02 NOTE — Assessment & Plan Note (Signed)
She did not complete bone density scan last year. Orders placed again. She will complete.

## 2022-08-02 NOTE — Assessment & Plan Note (Signed)
Reviewed colonoscopy from April 2023. No further testing recommended given her narrow left colon.

## 2022-08-02 NOTE — Addendum Note (Signed)
Addended by: Beryle Lathe S on: 08/02/2022 02:15 PM   Modules accepted: Orders

## 2022-08-02 NOTE — Addendum Note (Signed)
Addended by: Tammi Sou on: 08/02/2022 11:15 AM   Modules accepted: Orders

## 2022-08-03 LAB — COMPREHENSIVE METABOLIC PANEL
AG Ratio: 1.3 (calc) (ref 1.0–2.5)
ALT: 21 U/L (ref 6–29)
AST: 20 U/L (ref 10–35)
Albumin: 4.3 g/dL (ref 3.6–5.1)
Alkaline phosphatase (APISO): 76 U/L (ref 37–153)
BUN/Creatinine Ratio: 19 (calc) (ref 6–22)
BUN: 23 mg/dL (ref 7–25)
CO2: 26 mmol/L (ref 20–32)
Calcium: 9.3 mg/dL (ref 8.6–10.4)
Chloride: 105 mmol/L (ref 98–110)
Creat: 1.18 mg/dL — ABNORMAL HIGH (ref 0.60–1.00)
Globulin: 3.2 g/dL (calc) (ref 1.9–3.7)
Glucose, Bld: 133 mg/dL — ABNORMAL HIGH (ref 65–99)
Potassium: 4.5 mmol/L (ref 3.5–5.3)
Sodium: 142 mmol/L (ref 135–146)
Total Bilirubin: 0.4 mg/dL (ref 0.2–1.2)
Total Protein: 7.5 g/dL (ref 6.1–8.1)

## 2022-08-03 LAB — SPECIMEN STATUS REPORT

## 2022-08-03 LAB — NOVEL CORONAVIRUS, NAA: SARS-CoV-2, NAA: NOT DETECTED

## 2022-08-05 ENCOUNTER — Encounter (HOSPITAL_COMMUNITY)
Admission: RE | Disposition: A | Discharge status: Home / Self Care | Payer: Self-pay | Source: Ambulatory Visit | Attending: Emergency Medicine

## 2022-08-05 ENCOUNTER — Ambulatory Visit (HOSPITAL_COMMUNITY): Payer: Medicare Other | Admitting: Anesthesiology

## 2022-08-05 ENCOUNTER — Ambulatory Visit (HOSPITAL_COMMUNITY): Payer: Medicare Other

## 2022-08-05 ENCOUNTER — Ambulatory Visit (HOSPITAL_BASED_OUTPATIENT_CLINIC_OR_DEPARTMENT_OTHER): Payer: Medicare Other | Admitting: Anesthesiology

## 2022-08-05 ENCOUNTER — Encounter (HOSPITAL_COMMUNITY): Payer: Self-pay | Admitting: Emergency Medicine

## 2022-08-05 ENCOUNTER — Ambulatory Visit (HOSPITAL_COMMUNITY)
Admission: RE | Admit: 2022-08-05 | Discharge: 2022-08-05 | Disposition: A | Discharge status: Home / Self Care | Payer: Medicare Other | Source: Ambulatory Visit | Attending: Emergency Medicine | Admitting: Emergency Medicine

## 2022-08-05 ENCOUNTER — Other Ambulatory Visit: Payer: Self-pay

## 2022-08-05 DIAGNOSIS — J449 Chronic obstructive pulmonary disease, unspecified: Secondary | ICD-10-CM | POA: Insufficient documentation

## 2022-08-05 DIAGNOSIS — E785 Hyperlipidemia, unspecified: Secondary | ICD-10-CM | POA: Insufficient documentation

## 2022-08-05 DIAGNOSIS — K219 Gastro-esophageal reflux disease without esophagitis: Secondary | ICD-10-CM | POA: Insufficient documentation

## 2022-08-05 DIAGNOSIS — H9202 Otalgia, left ear: Secondary | ICD-10-CM

## 2022-08-05 DIAGNOSIS — M1990 Unspecified osteoarthritis, unspecified site: Secondary | ICD-10-CM | POA: Diagnosis not present

## 2022-08-05 DIAGNOSIS — C3431 Malignant neoplasm of lower lobe, right bronchus or lung: Secondary | ICD-10-CM | POA: Diagnosis not present

## 2022-08-05 DIAGNOSIS — R846 Abnormal cytological findings in specimens from respiratory organs and thorax: Secondary | ICD-10-CM | POA: Insufficient documentation

## 2022-08-05 DIAGNOSIS — I700 Atherosclerosis of aorta: Secondary | ICD-10-CM | POA: Diagnosis not present

## 2022-08-05 DIAGNOSIS — J984 Other disorders of lung: Secondary | ICD-10-CM | POA: Diagnosis not present

## 2022-08-05 DIAGNOSIS — R911 Solitary pulmonary nodule: Secondary | ICD-10-CM | POA: Insufficient documentation

## 2022-08-05 DIAGNOSIS — N189 Chronic kidney disease, unspecified: Secondary | ICD-10-CM | POA: Diagnosis not present

## 2022-08-05 DIAGNOSIS — F17200 Nicotine dependence, unspecified, uncomplicated: Secondary | ICD-10-CM | POA: Diagnosis not present

## 2022-08-05 DIAGNOSIS — F17210 Nicotine dependence, cigarettes, uncomplicated: Secondary | ICD-10-CM | POA: Insufficient documentation

## 2022-08-05 DIAGNOSIS — R918 Other nonspecific abnormal finding of lung field: Secondary | ICD-10-CM | POA: Diagnosis not present

## 2022-08-05 HISTORY — PX: VIDEO BRONCHOSCOPY WITH RADIAL ENDOBRONCHIAL ULTRASOUND: SHX6849

## 2022-08-05 HISTORY — PX: FIDUCIAL MARKER PLACEMENT: SHX6858

## 2022-08-05 HISTORY — PX: BRONCHIAL NEEDLE ASPIRATION BIOPSY: SHX5106

## 2022-08-05 HISTORY — PX: BRONCHIAL WASHINGS: SHX5105

## 2022-08-05 HISTORY — PX: BRONCHIAL BRUSHINGS: SHX5108

## 2022-08-05 HISTORY — PX: BRONCHIAL BIOPSY: SHX5109

## 2022-08-05 HISTORY — DX: Pneumonia, unspecified organism: J18.9

## 2022-08-05 LAB — CBC
HCT: 41.7 % (ref 36.0–46.0)
Hemoglobin: 13.7 g/dL (ref 12.0–15.0)
MCH: 30.6 pg (ref 26.0–34.0)
MCHC: 32.9 g/dL (ref 30.0–36.0)
MCV: 93.3 fL (ref 80.0–100.0)
Platelets: 163 10*3/uL (ref 150–400)
RBC: 4.47 MIL/uL (ref 3.87–5.11)
RDW: 12.5 % (ref 11.5–15.5)
WBC: 6.9 10*3/uL (ref 4.0–10.5)
nRBC: 0 % (ref 0.0–0.2)

## 2022-08-05 SURGERY — BRONCHOSCOPY, WITH BIOPSY USING ELECTROMAGNETIC NAVIGATION
Anesthesia: General | Laterality: Right

## 2022-08-05 MED ORDER — PHENYLEPHRINE HCL-NACL 20-0.9 MG/250ML-% IV SOLN
INTRAVENOUS | Status: DC | PRN
  Administered 2022-08-05: 25 ug/min via INTRAVENOUS

## 2022-08-05 MED ORDER — ESMOLOL HCL 100 MG/10ML IV SOLN
INTRAVENOUS | Status: DC | PRN
  Administered 2022-08-05: 30 mg via INTRAVENOUS

## 2022-08-05 MED ORDER — CHLORHEXIDINE GLUCONATE 0.12 % MT SOLN
OROMUCOSAL | Status: AC
  Administered 2022-08-05: 15 mL via OROMUCOSAL
  Filled 2022-08-05: qty 15

## 2022-08-05 MED ORDER — OXYCODONE HCL 5 MG/5ML PO SOLN: 5.0000 mg | Freq: Once | ORAL | Status: DC | PRN

## 2022-08-05 MED ORDER — DEXAMETHASONE SODIUM PHOSPHATE 4 MG/ML IJ SOLN
INTRAMUSCULAR | Status: DC | PRN
  Administered 2022-08-05: 5 mg via INTRAVENOUS

## 2022-08-05 MED ORDER — PROPOFOL 10 MG/ML IV BOLUS
INTRAVENOUS | Status: DC | PRN
  Administered 2022-08-05: 40 mg via INTRAVENOUS
  Administered 2022-08-05: 20 mg via INTRAVENOUS
  Administered 2022-08-05: 120 mg via INTRAVENOUS
  Administered 2022-08-05: 20 mg via INTRAVENOUS

## 2022-08-05 MED ORDER — LACTATED RINGERS IV SOLN: INTRAVENOUS | Status: DC

## 2022-08-05 MED ORDER — LIDOCAINE 2% (20 MG/ML) 5 ML SYRINGE
INTRAMUSCULAR | Status: DC | PRN
  Administered 2022-08-05: 100 mg via INTRAVENOUS

## 2022-08-05 MED ORDER — ONDANSETRON HCL 4 MG/2ML IJ SOLN
INTRAMUSCULAR | Status: DC | PRN
  Administered 2022-08-05: 4 mg via INTRAVENOUS

## 2022-08-05 MED ORDER — ROCURONIUM BROMIDE 10 MG/ML (PF) SYRINGE
PREFILLED_SYRINGE | INTRAVENOUS | Status: DC | PRN
  Administered 2022-08-05: 60 mg via INTRAVENOUS

## 2022-08-05 MED ORDER — OXYCODONE HCL 5 MG PO TABS: 5.0000 mg | ORAL_TABLET | Freq: Once | ORAL | Status: DC | PRN

## 2022-08-05 MED ORDER — FENTANYL CITRATE (PF) 100 MCG/2ML IJ SOLN: 25.0000 ug | INTRAMUSCULAR | Status: DC | PRN

## 2022-08-05 MED ORDER — CHLORHEXIDINE GLUCONATE 0.12 % MT SOLN
15.0000 mL | Freq: Once | OROMUCOSAL | Status: AC
  Filled 2022-08-05: qty 15

## 2022-08-05 MED ORDER — LACTATED RINGERS IV SOLN: INTRAVENOUS | Status: DC | PRN

## 2022-08-05 MED ORDER — FLUTICASONE PROPIONATE 50 MCG/ACT NA SUSP: 1.0000 | Freq: Every day | NASAL | Status: AC | PRN

## 2022-08-05 SURGICAL SUPPLY — 1 items: Super Lock fiducial marker IMPLANT

## 2022-08-05 NOTE — Transfer of Care (Signed)
Immediate Anesthesia Transfer of Care Note  Patient: Jessica [name]  Procedure(s) Performed: ROBOTIC ASSISTED NAVIGATIONAL BRONCHOSCOPY (Right) VIDEO BRONCHOSCOPY WITH RADIAL ENDOBRONCHIAL ULTRASOUND BRONCHIAL NEEDLE ASPIRATION BIOPSIES BRONCHIAL BRUSHINGS BRONCHIAL BIOPSIES FIDUCIAL MARKER PLACEMENT BRONCHIAL WASHINGS  Patient Location: PACU  Anesthesia Type:General  Level of Consciousness: awake, alert , oriented, and patient cooperative  Airway & Oxygen Therapy: Patient Spontanous Breathing and Patient connected to face mask oxygen  Post-op Assessment: Report given to RN, Post -op Vital signs reviewed and stable, and Patient moving all extremities X 4  Post vital signs: Reviewed and stable  Last Vitals:  Vitals Value Taken Time  BP 134/78 08/05/22 1218  Temp 36.5 C 08/05/22 1217  Pulse 70 08/05/22 1225  Resp 17 08/05/22 1225  SpO2 100 % 08/05/22 1225  Vitals shown include unvalidated device data.  Last Pain:  Vitals:   08/05/22 1217  TempSrc:   PainSc: 0-No pain         Complications: No notable events documented.

## 2022-08-05 NOTE — Anesthesia Postprocedure Evaluation (Signed)
Anesthesia Post Note  Patient: Jessica [name]  Procedure(s) Performed: ROBOTIC ASSISTED NAVIGATIONAL BRONCHOSCOPY (Right) VIDEO BRONCHOSCOPY WITH RADIAL ENDOBRONCHIAL ULTRASOUND BRONCHIAL NEEDLE ASPIRATION BIOPSIES BRONCHIAL BRUSHINGS BRONCHIAL BIOPSIES FIDUCIAL MARKER PLACEMENT BRONCHIAL WASHINGS     Patient location during evaluation: PACU Anesthesia Type: General Level of consciousness: awake and alert Pain management: pain level controlled Vital Signs Assessment: post-procedure vital signs reviewed and stable Respiratory status: spontaneous breathing, nonlabored ventilation and respiratory function stable Cardiovascular status: blood pressure returned to baseline Postop Assessment: no apparent nausea or vomiting Anesthetic complications: no   No notable events documented.  Last Vitals:  Vitals:   08/05/22 1245 08/05/22 1315  BP: 125/65 123/68  Pulse: 72 67  Resp: 17 15  Temp:    SpO2: 93% 91%    Last Pain:  Vitals:   08/05/22 1315  TempSrc:   PainSc: 0-No pain                 Marthenia Rolling

## 2022-08-05 NOTE — Discharge Instructions (Signed)
Flexible Bronchoscopy, Care After This sheet gives you information about how to care for yourself after your test. Your doctor may also give you more specific instructions. If you have problems or questions, contact your doctor. Follow these instructions at home: Eating and drinking When your numbness is gone and your cough and gag reflexes have come back, you may: Eat only soft foods. Slowly drink liquids. The day after the test, go back to your normal diet. Driving Do not drive for 24 hours if you were given a medicine to help you relax (sedative). Do not drive or use heavy machinery while taking prescription pain medicine. General instructions  Take over-the-counter and prescription medicines only as told by your doctor. Return to your normal activities as told. Ask what activities are safe for you. Do not use any products that have nicotine or tobacco in them. This includes cigarettes and e-cigarettes. If you need help quitting, ask your doctor. Keep all follow-up visits as told by your doctor. This is important. It is very important if you had a tissue sample (biopsy) taken. Get help right away if: You have shortness of breath that gets worse. You get light-headed. You feel like you are going to pass out (faint). You have chest pain. You cough up: More than a little blood. More blood than before. Summary Do not eat or drink anything (not even water) for 2 hours after your test, or until your numbing medicine wears off. Do not use cigarettes. Do not use e-cigarettes. Get help right away if you have chest pain.  Please call our office for any questions or concerns.  [phone].  This information is not intended to replace advice given to you by your health care provider. Make sure you discuss any questions you have with your health care provider. Document Released: 07/14/2009 Document Revised: 08/29/2017 Document Reviewed: 10/04/2016 Elsevier Patient Education  2020 Elsevier  Inc.  

## 2022-08-05 NOTE — Op Note (Signed)
Video Bronchoscopy with Robotic Assisted Bronchoscopic Navigation   Date of Operation: 08/05/2022   Pre-op Diagnosis: Right lower lobe pulmonary nodule  Post-op Diagnosis: Same  Surgeon: Baltazar Apo  Assistants: None  Anesthesia: General endotracheal anesthesia  Operation: Flexible video fiberoptic bronchoscopy with robotic assistance and biopsies.  Estimated Blood Loss: Minimal  Complications: None  Indications and History: Jessica [name] is a 71 y.o. female with history of tobacco use.  She has a slowly enlarging right lower lobe pulmonary nodule noted on CT chest.  Recommendation made to achieve a tissue diagnosis via robotic assisted navigational bronchoscopy. The risks, benefits, complications, treatment options and expected outcomes were discussed with the patient.  The possibilities of pneumothorax, pneumonia, reaction to medication, pulmonary aspiration, perforation of a viscus, bleeding, failure to diagnose a condition and creating a complication requiring transfusion or operation were discussed with the patient who freely signed the consent.    Description of Procedure: The patient was seen in the Preoperative Area, was examined and was deemed appropriate to proceed.  The patient was taken to [facility] endoscopy room 3, identified as Jessica [name] and the procedure verified as Flexible Video Fiberoptic Bronchoscopy.  A Time Out was held and the above information confirmed.   Prior to the date of the procedure a high-resolution CT scan of the chest was performed. Utilizing ION software program a virtual tracheobronchial tree was generated to allow the creation of distinct navigation pathways to the patient's parenchymal abnormalities. After being taken to the operating room general anesthesia was initiated and the patient  was orally intubated. The video fiberoptic bronchoscope was introduced via the endotracheal tube and a general inspection was performed which showed normal right  and left lung anatomy. Aspiration of the bilateral mainstems was completed to remove any remaining secretions. Robotic catheter inserted into patient's endotracheal tube.   Target #1 right lower lobe pulmonary nodule: The distinct navigation pathways prepared prior to this procedure were then utilized to navigate to patient's lesion identified on CT scan. The robotic catheter was secured into place and the vision probe was withdrawn.  Lesion location was approximated using fluoroscopy and radial endobronchial ultrasound for peripheral targeting. Under fluoroscopic guidance transbronchial needle brushings, transbronchial needle biopsies, and transbronchial forceps biopsies were performed to be sent for cytology and pathology. A bronchioalveolar lavage was performed in the right lower lobe and sent for cytology.  Under fluoroscopic guidance a single fiducial marker was placed adjacent to the nodule.   At the end of the procedure a general airway inspection was performed and there was no evidence of active bleeding. The bronchoscope was removed.  The patient tolerated the procedure well. There was no significant blood loss and there were no obvious complications. A post-procedural chest x-ray is pending.  Samples Target #1: 1. Transbronchial needle brushings from right lower lobe nodule 2. Transbronchial Wang needle biopsies from right lower lobe nodule 3. Transbronchial forceps biopsies from right lower lobe nodule 4. Bronchoalveolar lavage from right lower lobe    Plans:  The patient will be discharged from the PACU to home when recovered from anesthesia and after chest x-ray is reviewed. We will review the cytology, pathology and microbiology results with the patient when they become available. Outpatient followup will be with Dr. Lamonte Sakai.    Baltazar Apo, MD, PhD 08/05/2022, 12:10 PM [facility] [phone] or if no answer before 7:00PM call [phone] For any issues  after 7:00PM please call eLink [phone]

## 2022-08-05 NOTE — Interval H&P Note (Signed)
History and Physical Interval Note:  08/05/2022 11:14 AM  Jessica [name]  has presented today for surgery, with the diagnosis of RIGHT LOWER LOBE NODULE.  The various methods of treatment have been discussed with the patient and family. After consideration of risks, benefits and other options for treatment, the patient has consented to  Procedure(s): ROBOTIC ASSISTED NAVIGATIONAL BRONCHOSCOPY (Right) as a surgical intervention.  The patient's history has been reviewed, patient examined, no change in status, stable for surgery.  I have reviewed the patient's chart and labs.  Questions were answered to the patient's satisfaction.     Collene Gobble

## 2022-08-05 NOTE — Anesthesia Procedure Notes (Signed)
Procedure Name: Intubation Date/Time: 08/05/2022 11:26 AM  Performed by: Reeves Dam, CRNAPre-anesthesia Checklist: Patient identified, Patient being monitored, Timeout performed, Emergency Drugs available and Suction available Patient Re-evaluated:Patient Re-evaluated prior to induction Oxygen Delivery Method: Circle system utilized Preoxygenation: Pre-oxygenation with 100% oxygen Induction Type: IV induction Ventilation: Mask ventilation without difficulty Laryngoscope Size: 3 and Glidescope Grade View: Grade I Tube type: Oral Tube size: 8.5 mm Number of attempts: 1 Airway Equipment and Method: Stylet Placement Confirmation: ETT inserted through vocal cords under direct vision, positive ETCO2 and breath sounds checked- equal and bilateral Secured at: 22 cm Tube secured with: Tape Dental Injury: Teeth and Oropharynx as per pre-operative assessment  Comments: Very poor dentition and multiple missing and loose teeth, GlideGo used to prevent dental dislodgement.Pt aware and accepted dental risk.

## 2022-08-05 NOTE — Anesthesia Preprocedure Evaluation (Addendum)
Anesthesia Evaluation  Patient identified by MRN, date of birth, ID band Patient awake    Reviewed: Allergy & Precautions, NPO status , Patient's Chart, lab work & pertinent test results  History of Anesthesia Complications Negative for: history of anesthetic complications  Airway Mallampati: II  TM Distance: >3 FB Neck ROM: Full    Dental  (+) Poor Dentition,    Pulmonary COPD, Current Smoker and Patient abstained from smoking.   Pulmonary exam normal        Cardiovascular negative cardio ROS Normal cardiovascular exam     Neuro/Psych negative neurological ROS  negative psych ROS   GI/Hepatic Neg liver ROS,GERD  ,,  Endo/Other  negative endocrine ROS    Renal/GU negative Renal ROS  negative genitourinary   Musculoskeletal  (+) Arthritis ,    Abdominal   Peds  Hematology negative hematology ROS (+)   Anesthesia Other Findings Day of surgery medications reviewed with patient.  Reproductive/Obstetrics negative OB ROS                              Anesthesia Physical Anesthesia Plan  ASA: 3  Anesthesia Plan: General   Post-op Pain Management: Minimal or no pain anticipated   Induction: Intravenous  PONV Risk Score and Plan: Treatment may vary due to age or medical condition, Dexamethasone and Ondansetron  Airway Management Planned: Oral ETT  Additional Equipment: None  Intra-op Plan:   Post-operative Plan: Extubation in OR  Informed Consent: I have reviewed the patients History and Physical, chart, labs and discussed the procedure including the risks, benefits and alternatives for the proposed anesthesia with the patient or authorized representative who has indicated his/her understanding and acceptance.     Dental advisory given  Plan Discussed with: CRNA  Anesthesia Plan Comments:          Anesthesia Quick Evaluation

## 2022-08-08 ENCOUNTER — Encounter (HOSPITAL_COMMUNITY): Payer: Self-pay | Admitting: Emergency Medicine

## 2022-08-08 ENCOUNTER — Telehealth: Payer: Self-pay | Admitting: Student

## 2022-08-08 LAB — CYTOLOGY - NON PAP

## 2022-08-08 NOTE — Telephone Encounter (Signed)
Called and discussed bronch results. She wanted to make sure I passed results along to Dr. Sondra Come.

## 2022-08-09 ENCOUNTER — Telehealth: Payer: Self-pay | Admitting: Radiation Oncology

## 2022-08-09 DIAGNOSIS — C3431 Malignant neoplasm of lower lobe, right bronchus or lung: Secondary | ICD-10-CM | POA: Diagnosis not present

## 2022-08-09 DIAGNOSIS — R911 Solitary pulmonary nodule: Secondary | ICD-10-CM | POA: Diagnosis not present

## 2022-08-09 DIAGNOSIS — F17210 Nicotine dependence, cigarettes, uncomplicated: Secondary | ICD-10-CM | POA: Diagnosis not present

## 2022-08-09 NOTE — Telephone Encounter (Signed)
Telephone call  Jessica [name] recently underwent bronchoscopy and biopsy of the right lower lobe nodule.  This confirmed adenocarcinoma.  Today I spoke with her concerning her decision on management of this issue.  She did meet with Dr. Kipp Brood who felt she was  borderline for surgery.  She would like to proceed with SBRT and will be scheduled for this planning and treatment in the near future.  Anticipate 3 SBRT treatments directed at the solitary pulmonary nodule in the right lower lobe.   Blair Promise, MD

## 2022-08-14 ENCOUNTER — Ambulatory Visit
Admission: RE | Admit: 2022-08-14 | Discharge: 2022-08-14 | Disposition: A | Discharge status: Home / Self Care | Payer: Medicare Other | Source: Ambulatory Visit | Attending: Radiation Oncology | Admitting: Radiation Oncology

## 2022-08-14 ENCOUNTER — Other Ambulatory Visit: Payer: Self-pay

## 2022-08-14 VITALS — BP 108/74 | HR 64 | Temp 97.8°F | Resp 18

## 2022-08-14 DIAGNOSIS — Z510 Encounter for antineoplastic radiation therapy: Secondary | ICD-10-CM | POA: Insufficient documentation

## 2022-08-14 DIAGNOSIS — C3431 Malignant neoplasm of lower lobe, right bronchus or lung: Secondary | ICD-10-CM | POA: Diagnosis not present

## 2022-08-14 DIAGNOSIS — R911 Solitary pulmonary nodule: Secondary | ICD-10-CM

## 2022-08-14 DIAGNOSIS — F17210 Nicotine dependence, cigarettes, uncomplicated: Secondary | ICD-10-CM | POA: Diagnosis not present

## 2022-08-14 NOTE — Addendum Note (Signed)
Encounter addended by: Jacqulyn Liner, RN on: 08/14/2022 12:22 PM  Actions taken: Vitals modified

## 2022-08-14 NOTE — Telephone Encounter (Signed)
Thank you :)

## 2022-08-14 NOTE — Progress Notes (Signed)
Radiation Oncology         [phone] ________________________________  Follow-up New Visit   Outpatient  Name: Jessica [name] MRN: [id number]  Date: 08/14/2022  DOB: [birth year]  VH:QIONG, Leticia Penna, NP  Collene Gobble, MD   REFERRING PHYSICIAN: Collene Gobble, MD  DIAGNOSIS: The primary encounter diagnosis was Solitary pulmonary nodule. A diagnosis of Primary cancer of right lower lobe of lung (HCC) was also pertinent to this visit.  Adenocarcinoma of the right lower lobe; Stage IA2 (cT1b,cN0,cM0)  HISTORY OF PRESENT ILLNESS::Jessica [name] is a 71 y.o. female who returns today as a courtesy of Dr. Lamonte Sakai for further discussion/consideration of radiation therapy as part of management for her recently diagnosed right lung cancer.  I last met with the patient for her initial consultation on 05/23/22. To review from our last visit, we discussed general options for management including SBRT and surgery for the right lower lobe pulmonary nodule. We also discussed how surgery would give her the best curative outcome in the event that the nodule is determined to be malignant, and that SBRT would also be a reasonable option if she was found to not be a surgical candidate. She was already arranged to meet with Dr. Kipp Brood in September at the time of our last visit, and I requested that she inform us of her decision based on her discussion with Dr. Kipp Brood.   In the interval, the patient accordingly met with Dr. Kipp Brood on 05/31/22 for surgical consultation. Given her marginal lung function and ongoing smoking use, Dr. Kipp Brood reviewed potential post-operative risks including dependence on supplemental O2 following surgery. The patient was understandably hesitant to pursue surgery given the risks, and she requested time to reflect on her options. Dr. Kipp Brood also counseled the patient on the benefits of smoking cessation, particularly to allow her to pursue treatment.    The  patient opted to proceeded with bronchoscopy with biopsies on 08/05/22 to obtain a tissue diagnosis. FNA of the RLL revealed malignant cells consistent with adenocarcinoma. (RLL lavage showed benign findings).    Given the risks associated with surgery, the patient has opted to pursue SBRT to the RLL.     PREVIOUS RADIATION THERAPY: No  PAST MEDICAL HISTORY:  Past Medical History:  Diagnosis Date   Allergy    seasonal allergies   Anemia    as a teenager   Arthrosis of right acromioclavicular joint 2015   Cataract    sx 2019   COPD (chronic obstructive pulmonary disease) (HCC)    infreq exac, continued tobacco abuse   Dyslipidemia    GERD (gastroesophageal reflux disease)    with certain foods   History of kidney stones    Hyperlipidemia    on meds   Microscopic hematuria 07/28/2019   Osteoarthritis of knee    R knee   Pneumonia    Seasonal allergies     PAST SURGICAL HISTORY: Past Surgical History:  Procedure Laterality Date   [place]   BRONCHIAL BIOPSY  08/05/2022   Procedure: BRONCHIAL BIOPSIES;  Surgeon: Collene Gobble, MD;  Location: [facility];  Service: Pulmonary;;   BRONCHIAL BRUSHINGS  08/05/2022   Procedure: BRONCHIAL BRUSHINGS;  Surgeon: Collene Gobble, MD;  Location: [place];  Service: Pulmonary;;   BRONCHIAL NEEDLE ASPIRATION BIOPSY  08/05/2022   Procedure: BRONCHIAL NEEDLE ASPIRATION BIOPSIES;  Surgeon: Collene Gobble, MD;  Location: [facility];  Service: Pulmonary;;   BRONCHIAL WASHINGS  08/05/2022   Procedure: BRONCHIAL WASHINGS;  Surgeon: Collene Gobble, MD;  Location: [facility] ENDOSCOPY;  Service: Pulmonary;;   COLONOSCOPY     CYSTOSCOPY/URETEROSCOPY/HOLMIUM LASER/STENT PLACEMENT Right 08/03/2019   Procedure: CYSTOSCOPY/URETEROSCOPY/STENT PLACEMENT;  Surgeon: Robley Fries, MD;  Location: [facility];  Service: Urology;  Laterality: Right;  90 MINS   CYSTOSCOPY/URETEROSCOPY/HOLMIUM LASER/STENT PLACEMENT Right  08/17/2019   Procedure: CYSTOSCOPY/URETEROSCOPY/HOLMIUM LASER/STENT EXCHANGED;  Surgeon: Robley Fries, MD;  Location: [facility];  Service: Urology;  Laterality: Right;   FIDUCIAL MARKER PLACEMENT  08/05/2022   Procedure: FIDUCIAL MARKER PLACEMENT;  Surgeon: Collene Gobble, MD;  Location: [facility] ENDOSCOPY;  Service: Pulmonary;;   MOUTH SURGERY  2011   dental implants   POLYPECTOMY     right knee surgery  1988   "torn cartledge"   1998/2002   ROTATOR CUFF REPAIR Right 2015   [place]   VIDEO BRONCHOSCOPY WITH RADIAL ENDOBRONCHIAL ULTRASOUND  08/05/2022   Procedure: VIDEO BRONCHOSCOPY WITH RADIAL ENDOBRONCHIAL ULTRASOUND;  Surgeon: Collene Gobble, MD;  Location: [facility];  Service: Pulmonary;;   WISDOM TOOTH EXTRACTION  1970    FAMILY HISTORY:  Family History  Problem Relation Age of Onset   Hypertension Mother    Heart disease Mother    Colon polyps Mother 25   Multiple sclerosis Father    Cancer Brother        Pancreatic   Hypertension Brother    Breast cancer Maternal Aunt    Stroke Other    Colon cancer Neg Hx    Esophageal cancer Neg Hx    Rectal cancer Neg Hx    Stomach cancer Neg Hx    Crohn's disease Neg Hx     SOCIAL HISTORY:  Social History   Tobacco Use   Smoking status: Every Day    Packs/day: 0.25    Years: 51.00    Total pack years: 12.75    Types: Cigarettes    Passive exposure: Current   Smokeless tobacco: Never   Tobacco comments:    About 3 cigarettes per day 07/17/2022 PAP  Vaping Use   Vaping Use: Never used  Substance Use Topics   Alcohol use: Yes    Alcohol/week: 0.0 standard drinks of alcohol    Comment: rarely   Drug use: No    ALLERGIES:  Allergies  Allergen Reactions   Chantix [Varenicline Tartrate] Other (See Comments)    Nausea,vomiting,spitting,when switched to higher dosage    MEDICATIONS:  Current Outpatient Medications  Medication Sig Dispense Refill    Calcium-Vitamins C & D (CALCIUM/C/D PO) Take 1 each by mouth 2 (two) times daily. Good sense 1 chew twice a day     fluticasone (FLONASE) 50 MCG/ACT nasal spray Place 1 spray into both nostrils daily as needed for allergies.     Multiple Vitamin (MULTIVITAMIN ADULT PO) Take 1 tablet by mouth daily. Sentry -multivitamin take one daily     rosuvastatin (CRESTOR) 20 MG tablet TAKE 1 TABLET BY MOUTH ONCE DAILY IN THE EVENING FOR  CHOLESTEROL. 90 tablet 3   No current facility-administered medications for this encounter.    REVIEW OF SYSTEMS:  A 10+ POINT REVIEW OF SYSTEMS WAS OBTAINED including neurology, dermatology, psychiatry, cardiac, respiratory, lymph, extremities, GI, GU, musculoskeletal, constitutional, reproductive, HEENT.  She reports no issues since her last evaluation   PHYSICAL EXAM:  oral temperature is 97.8 F (36.6 C). Her blood pressure is 108/74 and her pulse is 64.  Her respiration is 18 and oxygen saturation is 100%.   General: Alert and oriented, in no acute distress HEENT: Head is normocephalic. Extraocular movements are intact.  Neck: Neck is supple, no palpable cervical or supraclavicular lymphadenopathy. Heart: Regular in rate and rhythm with no murmurs, rubs, or gallops. Chest: Clear to auscultation bilaterally, with no rhonchi, wheezes, or rales.     LABORATORY DATA:  Lab Results  Component Value Date   WBC 6.9 08/05/2022   HGB 13.7 08/05/2022   HCT 41.7 08/05/2022   MCV 93.3 08/05/2022   PLT 163 08/05/2022   NEUTROABS 5.9 08/01/2019   Lab Results  Component Value Date   NA 142 08/02/2022   K 4.5 08/02/2022   CL 105 08/02/2022   CO2 26 08/02/2022   GLUCOSE 133 (H) 08/02/2022   BUN 23 08/02/2022   CREATININE 1.18 (H) 08/02/2022   CALCIUM 9.3 08/02/2022      RADIOGRAPHY: DG Chest Port 1 View  Result Date: 08/05/2022 CLINICAL DATA:  Follow-up bronchoscopy and biopsy EXAM: PORTABLE CHEST 1 VIEW COMPARISON:  12/19/2011 FINDINGS: Heart size is normal. Mild  aortic atherosclerotic calcification is noted. Marker clip present in the right infrahilar region. Small focal density in that area. No evidence of pneumothorax or hemothorax. No lobar collapse. No evidence of postprocedural hemorrhage. IMPRESSION: Marker clip present in the right infrahilar region. Small focal density in that area. No evidence of postprocedural hemorrhage or pneumothorax. Electronically Signed   By: Nelson Chimes M.D.   On: 08/05/2022 12:52   DG C-ARM BRONCHOSCOPY  Result Date: 08/05/2022 C-ARM BRONCHOSCOPY: Fluoroscopy was utilized by the requesting physician.  No radiographic interpretation.   CT Super D Chest Wo Contrast  Result Date: 08/03/2022 CLINICAL DATA:  Solitary pulmonary nodule in a 71 year old female. EXAM: CT CHEST WITHOUT CONTRAST TECHNIQUE: Multidetector CT imaging of the chest was performed using thin slice collimation for electromagnetic bronchoscopy planning purposes, without intravenous contrast. RADIATION DOSE REDUCTION: This exam was performed according to the departmental dose-optimization program which includes automated exposure control, adjustment of the mA and/or kV according to patient size and/or use of iterative reconstruction technique. COMPARISON:  April 04, 2022 CT of the chest. Previous PET imaging from April 15, 2022. FINDINGS: Cardiovascular: Aortic atherosclerosis with calcified plaque. No aortic dilation. Normal heart size. Normal caliber of central pulmonary vessels. Mediastinum/Nodes: No acute findings in the mediastinum. No adenopathy. Mildly patulous esophagus. Lungs/Pleura: Moderate paraseptal and centrilobular pulmonary emphysema. RIGHT lower lobe nodule now at 13 x 12 mm previously 12 x 7 mm. Measurement comparison to July. (Image 72/5) RIGHT upper lobe pulmonary nodule (image 44/5) 6 mm, stable compared to most recent imaging. Peri fissural nodule in the inferior RIGHT upper lobe (image 69/4) also stable at approximately 4 mm. No pleural effusion.  No consolidation or new pulmonary nodule. Upper Abdomen: Incidental imaging of upper abdominal contents without acute process. Cholelithiasis. Mild pancreatic atrophy. Musculoskeletal: No chest wall mass. No acute bone finding. No destructive bone process. Spinal degenerative changes. Moderate degenerative changes of the thoracic and visualized lumbar spine. IMPRESSION: 1. RIGHT lower lobe nodule now at 13 x 12 mm previously 12 x 7 mm. This shows mildly spiculated margins and irregular lobular morphologic features. This is clearly enlarging and measured only 5 mm in 2021. Growth and morphology is highly suspicious for bronchogenic neoplasm. Referral to multi disciplinary thoracic oncologic setting with biopsy or repeat PET is suggested. 2. Stable smaller pulmonary nodules, attention on follow-up. These are present in the RIGHT upper lobe. 3.  Moderate paraseptal and centrilobular pulmonary emphysema. 4. Cholelithiasis. 5. Emphysema and aortic atherosclerosis. Aortic Atherosclerosis (ICD10-I70.0) and Emphysema (ICD10-J43.9). Electronically Signed   By: Zetta Bills M.D.   On: 08/03/2022 12:36      IMPRESSION: Stage IA2 (cT1b,cN0,cM0) Adenocarcinoma of the right lower lobe  She would be a good candidate for SBRT directed at her solitary nodule in the right lower lobe.    We discussed the natural history of adenocarcinoma of the lung and general treatment, highlighting the role of radiotherapy in the management.  We discussed the available radiation techniques, and focused on the details of logistics and delivery.  We reviewed the anticipated acute and late sequelae associated with radiation in this setting.  The patient was encouraged to ask questions that I answered to the best of my ability.  A patient consent form was discussed and signed.  We retained a copy for our records.  The patient would like to proceed with radiation.   PLAN: She will proceed with SBRT planning and treatment this afternoon.   Anticipate between 3 and 5 high-dose-rate treatments directed to her solitary nodule in the right lower lobe.   35 minutes of total time was spent for this patient encounter, including preparation, face-to-face counseling with the patient and coordination of care, physical exam, and documentation of the encounter.   ------------------------------------------------  Blair Promise, PhD, MD  This document serves as a record of services personally performed by Gery Pray, MD. It was created on his behalf by Roney Mans, a trained medical scribe. The creation of this record is based on the scribe's personal observations and the provider's statements to them. This document has been checked and approved by the attending provider.

## 2022-08-15 ENCOUNTER — Other Ambulatory Visit: Payer: Self-pay | Admitting: *Deleted

## 2022-08-15 NOTE — Progress Notes (Signed)
The proposed treatment discussed in conference is for discussion purpose only and is not a binding recommendation.  The patients have not been physically examined, or presented with their treatment options.  Therefore, final treatment plans cannot be decided.  

## 2022-08-20 DIAGNOSIS — C3431 Malignant neoplasm of lower lobe, right bronchus or lung: Secondary | ICD-10-CM | POA: Diagnosis not present

## 2022-08-20 DIAGNOSIS — Z510 Encounter for antineoplastic radiation therapy: Secondary | ICD-10-CM | POA: Diagnosis not present

## 2022-08-20 DIAGNOSIS — F17210 Nicotine dependence, cigarettes, uncomplicated: Secondary | ICD-10-CM | POA: Diagnosis not present

## 2022-08-20 DIAGNOSIS — R911 Solitary pulmonary nodule: Secondary | ICD-10-CM | POA: Diagnosis not present

## 2022-08-27 ENCOUNTER — Ambulatory Visit
Admission: RE | Admit: 2022-08-27 | Discharge: 2022-08-27 | Disposition: A | Discharge status: Home / Self Care | Payer: Medicare Other | Source: Ambulatory Visit | Attending: Radiation Oncology | Admitting: Radiation Oncology

## 2022-08-27 ENCOUNTER — Other Ambulatory Visit: Payer: Self-pay

## 2022-08-27 DIAGNOSIS — C3431 Malignant neoplasm of lower lobe, right bronchus or lung: Secondary | ICD-10-CM

## 2022-08-27 DIAGNOSIS — Z510 Encounter for antineoplastic radiation therapy: Secondary | ICD-10-CM | POA: Diagnosis not present

## 2022-08-27 LAB — RAD ONC ARIA SESSION SUMMARY
Course Elapsed Days: 0
Plan Fractions Treated to Date: 1
Plan Prescribed Dose Per Fraction: 18 Gy
Plan Total Fractions Prescribed: 3
Plan Total Prescribed Dose: 54 Gy
Reference Point Dosage Given to Date: 18 Gy
Reference Point Session Dosage Given: 18 Gy
Session Number: 1

## 2022-08-29 ENCOUNTER — Ambulatory Visit
Admission: RE | Admit: 2022-08-29 | Discharge: 2022-08-29 | Disposition: A | Discharge status: Home / Self Care | Payer: Medicare Other | Source: Ambulatory Visit | Attending: Radiation Oncology | Admitting: Radiation Oncology

## 2022-08-29 ENCOUNTER — Other Ambulatory Visit: Payer: Self-pay

## 2022-08-29 DIAGNOSIS — Z510 Encounter for antineoplastic radiation therapy: Secondary | ICD-10-CM | POA: Diagnosis not present

## 2022-08-29 DIAGNOSIS — C3431 Malignant neoplasm of lower lobe, right bronchus or lung: Secondary | ICD-10-CM

## 2022-08-29 LAB — RAD ONC ARIA SESSION SUMMARY
Course Elapsed Days: 2
Plan Fractions Treated to Date: 2
Plan Prescribed Dose Per Fraction: 18 Gy
Plan Total Fractions Prescribed: 3
Plan Total Prescribed Dose: 54 Gy
Reference Point Dosage Given to Date: 36 Gy
Reference Point Session Dosage Given: 18 Gy
Session Number: 2

## 2022-09-03 ENCOUNTER — Ambulatory Visit
Admission: RE | Admit: 2022-09-03 | Discharge: 2022-09-03 | Disposition: A | Discharge status: Home / Self Care | Payer: Medicare Other | Source: Ambulatory Visit | Attending: Radiation Oncology | Admitting: Radiation Oncology

## 2022-09-03 ENCOUNTER — Other Ambulatory Visit: Payer: Self-pay

## 2022-09-03 ENCOUNTER — Encounter: Payer: Self-pay | Admitting: Radiation Oncology

## 2022-09-03 DIAGNOSIS — C3431 Malignant neoplasm of lower lobe, right bronchus or lung: Secondary | ICD-10-CM | POA: Diagnosis not present

## 2022-09-03 DIAGNOSIS — R911 Solitary pulmonary nodule: Secondary | ICD-10-CM | POA: Diagnosis not present

## 2022-09-03 DIAGNOSIS — Z510 Encounter for antineoplastic radiation therapy: Secondary | ICD-10-CM | POA: Diagnosis not present

## 2022-09-03 DIAGNOSIS — F17210 Nicotine dependence, cigarettes, uncomplicated: Secondary | ICD-10-CM | POA: Diagnosis not present

## 2022-09-03 LAB — RAD ONC ARIA SESSION SUMMARY
Course Elapsed Days: 7
Plan Fractions Treated to Date: 3
Plan Prescribed Dose Per Fraction: 18 Gy
Plan Total Fractions Prescribed: 3
Plan Total Prescribed Dose: 54 Gy
Reference Point Dosage Given to Date: 54 Gy
Reference Point Session Dosage Given: 18 Gy
Session Number: 3

## 2022-09-10 ENCOUNTER — Ambulatory Visit: Payer: Medicare Other | Admitting: Emergency Medicine

## 2022-09-10 ENCOUNTER — Encounter: Payer: Self-pay | Admitting: Emergency Medicine

## 2022-09-10 VITALS — BP 138/78 | HR 64 | Temp 98.0°F | Ht 62.0 in | Wt 156.2 lb

## 2022-09-10 DIAGNOSIS — Z720 Tobacco use: Secondary | ICD-10-CM

## 2022-09-10 DIAGNOSIS — C3431 Malignant neoplasm of lower lobe, right bronchus or lung: Secondary | ICD-10-CM | POA: Diagnosis not present

## 2022-09-10 DIAGNOSIS — J439 Emphysema, unspecified: Secondary | ICD-10-CM

## 2022-09-10 DIAGNOSIS — Z23 Encounter for immunization: Secondary | ICD-10-CM

## 2022-09-10 MED ORDER — ALBUTEROL SULFATE HFA 108 (90 BASE) MCG/ACT IN AERS: 2.0000 | INHALATION_SPRAY | Freq: Four times a day (QID) | RESPIRATORY_TRACT | 6 refills | Status: DC | PRN

## 2022-09-10 NOTE — Progress Notes (Signed)
Subjective:    Patient ID: Jessica [name], female    DOB: May 05, 1951, 71 y.o.   MRN: [id number]  HPI 71 year old smoker (51 pack years) with a history of COPD, GERD, dyslipidemia, chronic kidney disease, seasonal allergies.  She participates in lung cancer screening program.  She has a 9 mm right lower lobe pulmonary nodule that has been increasing in size.  She was evaluated by Dr. Kipp Brood for consideration of surgical resection.  She has also been seen by Dr. Sondra Come with radiation oncology to discuss the option of SBRT. She reports that she has cough, bronchitis sx. She has been treated with abx   PET scan 04/15/2022 reviewed by me showed 9 mm right lower lobe pulmonary nodule with some very low level FDG activity.  No evidence of hypermetabolic thoracic adenopathy or any distant disease  PFT 05/06/22 reviewed by me shows severe obstruction without a bronchodilator response, borderline hyperinflation, decreased diffusion capacity that does not correct to the normal range when adjusted for alveolar volume.  ROV 09/10/22 --follow-up visit for Jessica [name], active smoker with a history of COPD.  She underwent navigational bronchoscopy 08/05/2022 for a right lower lobe pulmonary nodule.  The pathology showed adenocarcinoma.  She has been seen by Dr. Sondra Come in radiation oncology and is undergoing SBRT.  She reports that she is feeling fatigued. She has been having some cough - happening occasionally, through the day, minimally productive. Also some burning in her mid chest. She has some reflux. She is smoking 3-4 cig a day. No dyspnea. No flu shot yet this year.    Review of Systems As per HPI  Past Medical History:  Diagnosis Date   Allergy    seasonal allergies   Anemia    as a teenager   Arthrosis of right acromioclavicular joint 2015   Cataract    sx 2019   COPD (chronic obstructive pulmonary disease) (HCC)    infreq exac, continued tobacco abuse   Dyslipidemia    GERD  (gastroesophageal reflux disease)    with certain foods   History of kidney stones    Hyperlipidemia    on meds   Microscopic hematuria 07/28/2019   Osteoarthritis of knee    R knee   Pneumonia    Seasonal allergies      Family History  Problem Relation Age of Onset   Hypertension Mother    Heart disease Mother    Colon polyps Mother 75   Multiple sclerosis Father    Cancer Brother        Pancreatic   Hypertension Brother    Breast cancer Maternal Aunt    Stroke Other    Colon cancer Neg Hx    Esophageal cancer Neg Hx    Rectal cancer Neg Hx    Stomach cancer Neg Hx    Crohn's disease Neg Hx      Social History   Socioeconomic History   Marital status: Married    Spouse name: Not on file   Number of children: Not on file   Years of education: Not on file   Highest education level: Not on file  Occupational History   Not on file  Tobacco Use   Smoking status: Every Day    Packs/day: 0.25    Years: 51.00    Total pack years: 12.75    Types: Cigarettes    Passive exposure: Current   Smokeless tobacco: Never   Tobacco comments:    About 4 cigarettes per day  09/10/22 ARJ   Vaping Use   Vaping Use: Never used  Substance and Sexual Activity   Alcohol use: Yes    Alcohol/week: 0.0 standard drinks of alcohol    Comment: rarely   Drug use: No   Sexual activity: Not on file  Other Topics Concern   Not on file  Social History Narrative   Not on file   Social Determinants of Health   Financial Resource Strain: Low Risk  (04/16/2022)   Overall Financial Resource Strain (CARDIA)    Difficulty of Paying Living Expenses: Not hard at all  Food Insecurity: No Food Insecurity (04/16/2022)   Hunger Vital Sign    Worried About Running Out of Food in the Last Year: Never true    Ran Out of Food in the Last Year: Never true  Transportation Needs: No Transportation Needs (04/16/2022)   PRAPARE - Hydrologist (Medical): No    Lack of  Transportation (Non-Medical): No  Physical Activity: Inactive (04/16/2022)   Exercise Vital Sign    Days of Exercise per Week: 0 days    Minutes of Exercise per Session: 0 min  Stress: No Stress Concern Present (04/16/2022)   [place]    Feeling of Stress : Not at all  Social Connections: Moderately Isolated (04/16/2022)   Social Connection and Isolation Panel [NHANES]    Frequency of Communication with Friends and Family: More than three times a week    Frequency of Social Gatherings with Friends and Family: More than three times a week    Attends Religious Services: Never    Marine scientist or Organizations: No    Attends Archivist Meetings: Never    Marital Status: Married  Human resources officer Violence: Not At Risk (04/16/2022)   Humiliation, Afraid, Rape, and Kick questionnaire    Fear of Current or Ex-Partner: No    Emotionally Abused: No    Physically Abused: No    Sexually Abused: No     Allergies  Allergen Reactions   Chantix [Varenicline Tartrate] Other (See Comments)    Nausea,vomiting,spitting,when switched to higher dosage     Outpatient Medications Prior to Visit  Medication Sig Dispense Refill   Calcium-Vitamins C & D (CALCIUM/C/D PO) Take 1 each by mouth 2 (two) times daily. Good sense 1 chew twice a day     fluticasone (FLONASE) 50 MCG/ACT nasal spray Place 1 spray into both nostrils daily as needed for allergies.     Multiple Vitamin (MULTIVITAMIN ADULT PO) Take 1 tablet by mouth daily. Sentry -multivitamin take one daily     rosuvastatin (CRESTOR) 20 MG tablet TAKE 1 TABLET BY MOUTH ONCE DAILY IN THE EVENING FOR  CHOLESTEROL. 90 tablet 3   No facility-administered medications prior to visit.        Objective:   Physical Exam Vitals:   09/10/22 1536  BP: 138/78  Pulse: 64  Temp: 98 F (36.7 C)  TempSrc: Oral  SpO2: 98%  Weight: 156 lb 3.2 oz (70.9 kg)  Height: 5\' 2"   (1.575 m)   Gen: Pleasant, well-nourished, in no distress,  normal affect  ENT: No lesions,  mouth clear, poor dentition, oropharynx clear, no postnasal drip  Neck: No JVD, no stridor  Lungs: No use of accessory muscles, no crackles or wheezing on normal respiration, no wheeze on forced expiration  Cardiovascular: RRR, heart sounds normal, no murmur or gallops, no peripheral edema  Musculoskeletal:  No deformities, no cyanosis or clubbing  Neuro: alert, awake, non focal  Skin: Warm, no lesions or rash      Assessment & Plan:  COPD (chronic obstructive pulmonary disease) We will give you an albuterol inhaler.  You can use 2 puffs up to every 4 hours if needed for shortness of breath, chest tightness, wheezing. We will hold off on starting a scheduled inhaler medication for now. Flu shot today Consider getting the COVID-19 booster vaccine that is available now Follow with Dr. Lamonte Sakai in 12 months or sooner if you have any problems.   Primary cancer of right lower lobe of lung (HCC) Tolerating SBRT.  Following with Dr. Sondra Come.  She will have serial imaging as per his plans.  Tobacco abuse Discussed cessation with her today   Baltazar Apo, MD, PhD 09/10/2022, 3:59 PM [facility] [phone] or if no answer before 7:00PM call [phone] For any issues after 7:00PM please call eLink [phone]

## 2022-09-10 NOTE — Addendum Note (Signed)
Addended by: Gavin Potters R on: 09/10/2022 04:11 PM   Modules accepted: Orders

## 2022-09-10 NOTE — Assessment & Plan Note (Signed)
Discussed cessation with her today

## 2022-09-10 NOTE — Assessment & Plan Note (Signed)
Tolerating SBRT.  Following with Dr. Sondra Come.  She will have serial imaging as per his plans.

## 2022-09-10 NOTE — Patient Instructions (Signed)
Continue to work on stopping smoking. We will give you an albuterol inhaler.  You can use 2 puffs up to every 4 hours if needed for shortness of breath, chest tightness, wheezing. We will hold off on starting a scheduled inhaler medication for now. Continue to follow with Dr. Sondra Come.  Get your repeat CT scan of the chest as per his plans. Flu shot today Consider getting the COVID-19 booster vaccine that is available now Follow with Dr. Lamonte Sakai in 12 months or sooner if you have any problems.

## 2022-09-10 NOTE — Assessment & Plan Note (Signed)
We will give you an albuterol inhaler.  You can use 2 puffs up to every 4 hours if needed for shortness of breath, chest tightness, wheezing. We will hold off on starting a scheduled inhaler medication for now. Flu shot today Consider getting the COVID-19 booster vaccine that is available now Follow with Dr. Lamonte Sakai in 12 months or sooner if you have any problems.

## 2022-09-16 ENCOUNTER — Other Ambulatory Visit: Payer: Self-pay | Admitting: Primary Care

## 2022-09-16 DIAGNOSIS — E785 Hyperlipidemia, unspecified: Secondary | ICD-10-CM

## 2022-09-26 NOTE — Progress Notes (Incomplete)
Ms. Cham presents today following completion of radiation treatment for lung cancer. She completed treatment on 09-03-22.  PAIN: {Pain rating:20411} {PAIN DESCRIPTION:21022940} over {Location on body:14304}.  RESPIRATORY: {CHL RAD ONC LUNG [id number]. Pt is on {Exam; oxygen delivery:30093}. Noted {Exam; skin abnormals:103} {CHL RAD ONC SKIN ROS:11522895}.  SWALLOWING/DIET: Pt {gerd dysphagia:12484} {Desc; oto dysphagia:17829}. {Daily diet habits:20576}.  OTHER: Pt complains of {Blank multiple:19196::"fatigue","weakness","loss of sleep","poor appetite"}.  There were no vitals taken for this visit.   Wt Readings from Last 3 Encounters:  09/10/22 156 lb 3.2 oz (70.9 kg)  08/05/22 152 lb (68.9 kg)  08/02/22 152 lb (68.9 kg)

## 2022-10-01 ENCOUNTER — Encounter: Payer: Self-pay | Admitting: Radiation Oncology

## 2022-10-02 ENCOUNTER — Other Ambulatory Visit (INDEPENDENT_AMBULATORY_CARE_PROVIDER_SITE_OTHER): Payer: Medicare Other

## 2022-10-02 ENCOUNTER — Other Ambulatory Visit: Payer: Medicare Other

## 2022-10-02 DIAGNOSIS — E785 Hyperlipidemia, unspecified: Secondary | ICD-10-CM | POA: Diagnosis not present

## 2022-10-02 LAB — LIPID PANEL
Cholesterol: 172 mg/dL (ref 0–200)
HDL: 49.6 mg/dL (ref >39.00)
NonHDL: 122.11
Total CHOL/HDL Ratio: 3
Triglycerides: 212 mg/dL — ABNORMAL HIGH (ref 0.0–149.0)
VLDL: 42.4 mg/dL — ABNORMAL HIGH (ref 0.0–40.0)

## 2022-10-02 LAB — HEMOGLOBIN A1C: Hgb A1c MFr Bld: 5.5 % (ref 4.6–6.5)

## 2022-10-02 LAB — LDL CHOLESTEROL, DIRECT: Direct LDL: 90 mg/dL

## 2022-10-02 NOTE — Progress Notes (Incomplete)
  Radiation Oncology         [phone] ________________________________  Patient Name: Jessica [name] MRN: [id number] DOB: June 12, 1951 Referring Physician: Baltazar Apo (Profile Not Attached) Date of Service: 09/03/2022 [place] [facility] [place]                                                        End Of Treatment Note  Diagnoses: R91.1-Solitary pulmonary nodule  Cancer Staging: The primary encounter diagnosis was Solitary pulmonary nodule. A diagnosis of Primary cancer of right lower lobe of lung (HCC) was also pertinent to this visit.   Adenocarcinoma of the right lower lobe; Stage IA2 (cT1b,cN0,cM0)  Intent: Curative  Radiation Treatment Dates: 08/27/2022 through 09/03/2022 Site Technique Total Dose (Gy) Dose per Fx (Gy) Completed Fx Beam Energies  Lung, Right: Lung_R IMRT 54/54 18 3/3 6XFFF   Narrative: The patient tolerated radiation therapy relatively well. On the date of her final treatment, the patient endorsed a burning sensation to chest, fatigue, mild redness to right chest,  and dry cough (prone to bronchitis). She denied any other side effects from treatment.   Plan: The patient will follow-up with radiation oncology in one month .  ________________________________________________ -----------------------------------  Blair Promise, PhD, MD  This document serves as a record of services personally performed by Gery Pray, MD. It was created on his behalf by Roney Mans, a trained medical scribe. The creation of this record is based on the scribe's personal observations and the provider's statements to them. This document has been checked and approved by the attending provider.

## 2022-10-02 NOTE — Progress Notes (Signed)
Radiation Oncology         [phone] ________________________________  Name: Jessica [name] MRN: [id number]  Date: 10/03/2022  DOB: 1950-10-04  Follow-Up Visit Note  CC: Pleas Koch, NP  Collene Gobble, MD    ICD-10-CM   1. Primary cancer of right lower lobe of lung (HCC)  C34.31 CT Chest Wo Contrast      Diagnosis: The primary encounter diagnosis was Solitary pulmonary nodule. A diagnosis of Primary cancer of right lower lobe of lung (HCC) was also pertinent to this visit.   Adenocarcinoma of the right lower lobe; Stage IA2 (cT1b,cN0,cM0)   Interval Since Last Radiation: approximately 1 month  Intent: Curative  Radiation Treatment Dates: 08/27/2022 through 09/03/2022 Site Technique Total Dose (Gy) Dose per Fx (Gy) Completed Fx Beam Energies  Lung, Right: Lung_R IMRT 54/54 18 3/3 6XFFF   Narrative:  The patient returns today for routine follow-up. The patient tolerated radiation therapy relatively well. On the date of her final treatment, the patient endorsed a burning sensation to chest, fatigue, mild redness to right chest, and dry cough (prone to bronchitis). She denied any other side effects from treatment.     Since completing radiation, the patient followed up with Dr. Lamonte Sakai on 09/10/22. During which time, the patient endorsed fatigue, occasional cough which productive at times, and occasional burning sensations in her chest (possibly associated with reflux). She also reported ongoing cigarette use (smoking on average 3-4 cigarettes a day). For her symptoms associated with COPD, she was given an albuterol inhaler.   Otherwise, no significant interval history since the patient completed radiation therapy.   Patient states her breathing is normal today. She endorses a dry cough that is baseline for her, but has not produced any phlegm. Her albuterol has been helping. She denies chest pain or hemoptysis. She does state she has experienced a significant amount of  heart burn since the radiation. She is wondering if her supplement, acid-ease, is safe to take after radiation. She also notes some itchiness and redness in her left, inframammary fold.                              Allergies:  is allergic to chantix [varenicline tartrate].  Meds: Current Outpatient Medications  Medication Sig Dispense Refill   albuterol (VENTOLIN HFA) 108 (90 Base) MCG/ACT inhaler Inhale 2 puffs into the lungs every 6 (six) hours as needed for wheezing or shortness of breath. 8 g 6   Calcium-Vitamins C & D (CALCIUM/C/D PO) Take 1 each by mouth 2 (two) times daily. Good sense 1 chew twice a day     fluticasone (FLONASE) 50 MCG/ACT nasal spray Place 1 spray into both nostrils daily as needed for allergies.     Multiple Vitamin (MULTIVITAMIN ADULT PO) Take 1 tablet by mouth daily. Sentry -multivitamin take one daily     rosuvastatin (CRESTOR) 20 MG tablet TAKE 1 TABLET BY MOUTH ONCE DAILY IN THE EVENING FOR  CHOLESTEROL. 90 tablet 3   No current facility-administered medications for this encounter.    Physical Findings: The patient is in no acute distress. Patient is alert and oriented.  weight is 157 lb 2 oz (71.3 kg). Her blood pressure is 112/86 and her pulse is 83. Her respiration is 18 and oxygen saturation is 100%. .   Lungs are clear to auscultation bilaterally. Heart has regular rate and rhythm. No palpable cervical, supraclavicular, or axillary adenopathy. Abdomen  soft, non-tender, normal bowel sounds.  Slight erythema surrounding hyperpigmentation in the right inframammary fold. No concerning signs of inflammation or infection visualized.    Lab Findings: Lab Results  Component Value Date   WBC 6.9 08/05/2022   HGB 13.7 08/05/2022   HCT 41.7 08/05/2022   MCV 93.3 08/05/2022   PLT 163 08/05/2022    Radiographic Findings: I personally reviewed her images.   Impression: The primary encounter diagnosis was Solitary pulmonary nodule. A diagnosis of Primary  cancer of right lower lobe of lung (HCC) was also pertinent to this visit.   Adenocarcinoma of the right lower lobe; Stage IA2 (cT1b,cN0,cM0)   The patient is recovering from the effects of radiation.  No evidence of disease recurrence on clinical exam today.   Plan:  Follow up chest CT in 3 months. Routine office visit 1-2 days after scan to review results. Patient knows to call with any questions or concerns in the meantime.   Acid-ease to help with heartburn. If this does not work try over the counter medications such as Prilosec or Nexium. Also, recommend follow up with PCP for possible further evaluation.   ____________________________________   Leona Singleton, PA   Blair Promise, PhD, MD  This document serves as a record of services personally performed by Gery Pray, MD. It was created on his behalf by Roney Mans, a trained medical scribe. The creation of this record is based on the scribe's personal observations and the provider's statements to them. This document has been checked and approved by the attending provider.

## 2022-10-03 ENCOUNTER — Other Ambulatory Visit: Payer: Self-pay

## 2022-10-03 ENCOUNTER — Ambulatory Visit
Admission: RE | Admit: 2022-10-03 | Discharge: 2022-10-03 | Disposition: A | Discharge status: Home / Self Care | Payer: Medicare Other | Source: Ambulatory Visit | Attending: Radiation Oncology | Admitting: Radiation Oncology

## 2022-10-03 ENCOUNTER — Encounter: Payer: Self-pay | Admitting: Radiation Oncology

## 2022-10-03 VITALS — BP 112/86 | HR 83 | Resp 18 | Wt 157.1 lb

## 2022-10-03 DIAGNOSIS — R12 Heartburn: Secondary | ICD-10-CM | POA: Diagnosis not present

## 2022-10-03 DIAGNOSIS — Z79899 Other long term (current) drug therapy: Secondary | ICD-10-CM | POA: Insufficient documentation

## 2022-10-03 DIAGNOSIS — C3431 Malignant neoplasm of lower lobe, right bronchus or lung: Secondary | ICD-10-CM | POA: Diagnosis not present

## 2022-10-03 DIAGNOSIS — Z923 Personal history of irradiation: Secondary | ICD-10-CM | POA: Diagnosis not present

## 2022-10-03 HISTORY — DX: Personal history of irradiation: Z92.3

## 2022-10-03 NOTE — Progress Notes (Signed)
Jessica [name] is here today for follow up post radiation to the lung.  Lung Side: Right, patient completed treatment on 09/03/22  Does the patient complain of any of the following: Pain: None Shortness of breath w/wo exertion: None Cough: Some Hemoptysis: No Pain with swallowing: No Swallowing/choking concerns: No Appetite: Good Energy Level: Mild fatigue Post radiation skin Changes: some time itching    Additional comments if applicable: Reports having heartburn a;lot at night. Vitals:   10/03/22 1036  BP: 112/86  Pulse: 83  Resp: 18  SpO2: 100%  Weight: 71.3 kg

## 2022-10-08 NOTE — Radiation Completion Notes (Signed)
Patient Name: Jessica [name], Jessica [name] MRN: [id number] Date of Birth: June 27, 1951 Referring Physician: Baltazar Apo, M.D. Date of Service: 2022-10-08 Radiation Oncologist: Teryl Lucy, M.D. [place]                             Radiation Oncology End of Treatment Note     Diagnosis: R91.1 Solitary pulmonary nodule Staging on 2022-08-14: Primary cancer of right lower lobe of lung ([place]) T=cT1b, N=cN0, M=cM0 Intent: Curative     ==========DELIVERED PLANS==========  First Treatment Date: 2022-08-27 - Last Treatment Date: 2022-09-03   Plan Name: Lung_R_SBRT Site: Lung, Right Technique: SBRT/SRT-IMRT Mode: Photon Dose Per Fraction: 18 Gy Prescribed Dose (Delivered / Prescribed): 54 Gy / 54 Gy Prescribed Fxs (Delivered / Prescribed): 3 / 3     ==========ON TREATMENT VISIT DATES========== 2022-08-27, 2022-08-29, 2022-09-03, 2022-09-03     ==========UPCOMING VISITS==========       ==========APPENDIX - ON TREATMENT VISIT NOTES==========   PatEd 2022-09-03 Ongoing education performed.   ImpPlan 2022-09-03 The patient is tolerating radiation. Continue treatment as planned.   PhysExam 2022-09-03 Alert, no acute distress.   ProgNote 2022-09-03 Changes from last week/visit? [ No ] Pain? [ Yes- patient reports burning sensation to chest.  ] Fatigue? [ Yes ] Skin irritation? [ reports mild redness to right chest.  ] Shortness of breath? Cough? [ Yes- dry cough ] Pain or diff. swallowing? [ No ] Getting chemo/immunotherapy? Last tx: [ No ] Need refills: [ No ] Additional  Weekly Progress Notes [  ]

## 2022-12-30 NOTE — Progress Notes (Signed)
[name] is here today for follow up post radiation to the lung.  Lung Side: Rt lung Completed treatment on 09-03-22  Does the patient complain of any of the following: Pain:Denies Shortness of breath w/wo exertion:  Denies Cough: Denies Hemoptysis: Denies Pain with swallowing: denies states that she is having acid reflux .State that she uses acid ease for relief. States that she is burping a lot also. Swallowing/choking concerns: denies Appetite: good Energy Level: mild fatigue Post radiation skin Changes: Denies any issues with her skin besides itching.    Additional comments if applicable: Vitals:   01/06/23 1058  BP: 104/68  Pulse: 79  Resp: 18  SpO2: 100%  Weight: 71.8 kg

## 2023-01-03 ENCOUNTER — Ambulatory Visit (HOSPITAL_COMMUNITY)
Admission: RE | Admit: 2023-01-03 | Discharge: 2023-01-03 | Disposition: A | Discharge status: Home / Self Care | Payer: Medicare Other | Source: Ambulatory Visit | Attending: Radiology | Admitting: Radiology

## 2023-01-03 ENCOUNTER — Encounter (HOSPITAL_COMMUNITY): Payer: Self-pay

## 2023-01-03 DIAGNOSIS — C3431 Malignant neoplasm of lower lobe, right bronchus or lung: Secondary | ICD-10-CM | POA: Diagnosis not present

## 2023-01-03 DIAGNOSIS — C3432 Malignant neoplasm of lower lobe, left bronchus or lung: Secondary | ICD-10-CM | POA: Diagnosis not present

## 2023-01-03 DIAGNOSIS — J432 Centrilobular emphysema: Secondary | ICD-10-CM | POA: Diagnosis not present

## 2023-01-05 NOTE — Progress Notes (Signed)
  Radiation Oncology         (336) [phone] ________________________________  Name: Jessica [name] MRN: [id number]  Date: 01/06/2023  DOB: 03/14/1951  Follow-Up Visit Note  CC: Doreene Nest, NP  Leslye Peer, MD  No diagnosis found.  Diagnosis: The primary encounter diagnosis was Solitary pulmonary nodule. A diagnosis of Primary cancer of right lower lobe of lung (HCC) was also pertinent to this visit.   Adenocarcinoma of the right lower lobe; Stage IA2 (cT1b,cN0,cM0)   Interval Since Last Radiation: 4 months and 3 days   Intent: Curative  Radiation Treatment Dates: 08/27/2022 through 09/03/2022 Site Technique Total Dose (Gy) Dose per Fx (Gy) Completed Fx Beam Energies  Lung, Right: Lung_R IMRT 54/54 18 3/3 6XFFF   Narrative:  The patient returns today for routine follow-up and to review recent imaging. She was last seen here for follow-up on 10/03/22. Her most recent chest CT on 01/03/23 demonstrates: ***                               Allergies:  is allergic to chantix [varenicline tartrate].  Meds: Current Outpatient Medications  Medication Sig Dispense Refill   albuterol (VENTOLIN HFA) 108 (90 Base) MCG/ACT inhaler Inhale 2 puffs into the lungs every 6 (six) hours as needed for wheezing or shortness of breath. 8 g 6   Calcium-Vitamins C & D (CALCIUM/C/D PO) Take 1 each by mouth 2 (two) times daily. Good sense 1 chew twice a day     fluticasone (FLONASE) 50 MCG/ACT nasal spray Place 1 spray into both nostrils daily as needed for allergies.     Multiple Vitamin (MULTIVITAMIN ADULT PO) Take 1 tablet by mouth daily. Sentry -multivitamin take one daily     rosuvastatin (CRESTOR) 20 MG tablet TAKE 1 TABLET BY MOUTH ONCE DAILY IN THE EVENING FOR  CHOLESTEROL. 90 tablet 3   No current facility-administered medications for this encounter.    Physical Findings: The patient is in no acute distress. Patient is alert and oriented.  vitals were not taken for this visit. .  No  significant changes. Lungs are clear to auscultation bilaterally. Heart has regular rate and rhythm. No palpable cervical, supraclavicular, or axillary adenopathy. Abdomen soft, non-tender, normal bowel sounds.   Lab Findings: Lab Results  Component Value Date   WBC 6.9 08/05/2022   HGB 13.7 08/05/2022   HCT 41.7 08/05/2022   MCV 93.3 08/05/2022   PLT 163 08/05/2022    Radiographic Findings: No results found.  Impression: The primary encounter diagnosis was Solitary pulmonary nodule. A diagnosis of Primary cancer of right lower lobe of lung (HCC) was also pertinent to this visit.   Adenocarcinoma of the right lower lobe; Stage IA2 (cT1b,cN0,cM0)   The patient is recovering from the effects of radiation.  ***  Plan:  ***   *** minutes of total time was spent for this patient encounter, including preparation, face-to-face counseling with the patient and coordination of care, physical exam, and documentation of the encounter. ____________________________________  Billie Lade, PhD, MD  This document serves as a record of services personally performed by Antony Blackbird, MD. It was created on his behalf by Neena Rhymes, a trained medical scribe. The creation of this record is based on the scribe's personal observations and the provider's statements to them. This document has been checked and approved by the attending provider.

## 2023-01-06 ENCOUNTER — Encounter: Payer: Self-pay | Admitting: Radiation Oncology

## 2023-01-06 ENCOUNTER — Ambulatory Visit
Admission: RE | Admit: 2023-01-06 | Discharge: 2023-01-06 | Disposition: A | Discharge status: Home / Self Care | Payer: Medicare Other | Source: Ambulatory Visit | Attending: Radiation Oncology | Admitting: Radiation Oncology

## 2023-01-06 VITALS — BP 104/68 | HR 79 | Resp 18 | Wt 158.2 lb

## 2023-01-06 DIAGNOSIS — C3431 Malignant neoplasm of lower lobe, right bronchus or lung: Secondary | ICD-10-CM | POA: Diagnosis not present

## 2023-01-06 DIAGNOSIS — Z923 Personal history of irradiation: Secondary | ICD-10-CM | POA: Insufficient documentation

## 2023-01-06 DIAGNOSIS — Z79899 Other long term (current) drug therapy: Secondary | ICD-10-CM | POA: Diagnosis not present

## 2023-01-06 DIAGNOSIS — I2510 Atherosclerotic heart disease of native coronary artery without angina pectoris: Secondary | ICD-10-CM | POA: Insufficient documentation

## 2023-01-06 DIAGNOSIS — R911 Solitary pulmonary nodule: Secondary | ICD-10-CM | POA: Diagnosis not present

## 2023-01-06 DIAGNOSIS — J432 Centrilobular emphysema: Secondary | ICD-10-CM | POA: Diagnosis not present

## 2023-01-06 DIAGNOSIS — Z85118 Personal history of other malignant neoplasm of bronchus and lung: Secondary | ICD-10-CM | POA: Diagnosis not present

## 2023-01-06 DIAGNOSIS — F17210 Nicotine dependence, cigarettes, uncomplicated: Secondary | ICD-10-CM | POA: Diagnosis not present

## 2023-03-10 ENCOUNTER — Telehealth: Payer: Self-pay | Admitting: Emergency Medicine

## 2023-03-10 NOTE — Telephone Encounter (Signed)
Pt needs a doctors note to excuse her from [employer]

## 2023-03-12 NOTE — Telephone Encounter (Signed)
Hey, Dr.Byrum can we get this pt a doctors note to excuss her from jury duty. Pt states she cannot walk the distance to the court house from the parking due to her COPD. Please advise. I have informed pt if approved she may have to pick the letter up.

## 2023-03-12 NOTE — Telephone Encounter (Signed)
Yes - she needs to give [place] a Juror number from her paperwork. Then ok to write letter to request that she can be excused. We have to include the juror number on the letter.

## 2023-03-13 ENCOUNTER — Encounter: Payer: Self-pay | Admitting: *Deleted

## 2023-03-13 NOTE — Telephone Encounter (Signed)
Spoke with the pt and notified of response per Dr Delton Coombes  Her Juror number [phone]  Date to report for duty:03/31/23  I have done the letter and it's ready to be printed and signed   Marchelle Folks, will you please print and have RB sign it and then place up front? She wants to pick this up when ready.   Thanks!

## 2023-03-18 NOTE — Telephone Encounter (Signed)
Marchelle Folks, was this taken care of yet? Thanks!

## 2023-03-18 NOTE — Telephone Encounter (Signed)
Patient checking on message for jury duty excuse.. Patient phone number is [phone].

## 2023-03-19 NOTE — Telephone Encounter (Signed)
Notified pt that jury letter was ready for pick up at our office. Pt stated understanding and that she would be by office to pick it up. Letter placed up front for pt pickup.

## 2023-04-14 ENCOUNTER — Telehealth: Payer: Self-pay | Admitting: Primary Care

## 2023-04-14 DIAGNOSIS — E042 Nontoxic multinodular goiter: Secondary | ICD-10-CM

## 2023-04-14 NOTE — Telephone Encounter (Signed)
Spoke to patient she is ok with moving forward with it. She would like sent to [place]. Advised if nobody has reached out to set up appointment in two weeks to let our office know.

## 2023-04-14 NOTE — Telephone Encounter (Signed)
Noted, ultrasound order placed.

## 2023-04-14 NOTE — Telephone Encounter (Addendum)
-----   Message from Doreene Nest sent at 04/22/2022  5:33 PM EDT ----- Regarding: Repeat Thyroid US   Please call patient:  Due for repeat Thyroid US. Is she okay to proceed? Which location?  or [place]?

## 2023-04-16 ENCOUNTER — Ambulatory Visit
Admission: RE | Admit: 2023-04-16 | Discharge: 2023-04-16 | Disposition: A | Discharge status: Home / Self Care | Payer: Medicare Other | Source: Ambulatory Visit | Attending: Primary Care | Admitting: Primary Care

## 2023-04-16 DIAGNOSIS — E042 Nontoxic multinodular goiter: Secondary | ICD-10-CM | POA: Diagnosis not present

## 2023-04-20 NOTE — Patient Instructions (Incomplete)
Jessica [name] , Thank you for taking time to come for your Medicare Wellness Visit. I appreciate your ongoing commitment to your health goals. Please review the following plan we discussed and let me know if I can assist you in the future.   These are the goals we discussed:  Goals      Remain active and independent        This is a list of the screening recommended for you and due dates:  Health Maintenance  Topic Date Due   DTaP/Tdap/Td vaccine (3 - Td or Tdap) 02/20/2022   COVID-19 Vaccine (4 - 2023-24 season) 05/07/2023*   Flu Shot  05/01/2023   Medicare Annual Wellness Visit  04/20/2024   Mammogram  05/15/2024   Pneumonia Vaccine  Completed   DEXA scan (bone density measurement)  Completed   Hepatitis C Screening  Completed   HPV Vaccine  Aged Out   Screening for Lung Cancer  Discontinued   Colon Cancer Screening  Discontinued   Cologuard (Stool DNA test)  Discontinued   Zoster (Shingles) Vaccine  Discontinued  *Topic was postponed. The date shown is not the original due date.    Advanced directives: We have a copy of your advanced directives available in your record should your provider ever need to access them.  Conditions/risks identified: Aim for 30 minutes of exercise or brisk walking, 6-8 glasses of water, and 5 servings of fruits and vegetables each day.  Next appointment: Follow up in one year for your annual wellness visit   You have an order for:  []   2D Mammogram  []   3D Mammogram  [x]   Bone Density     Please call for appointment:   [facility] [address] [place], [place] [address] [phone]   Make sure to wear two-piece clothing.  No lotions, powders, or deodorants the day of the appointment. Make sure to bring picture ID and insurance card.  Bring list of medications you are currently taking including any supplements.   Schedule your [place] screening mammogram through MyChart!   Log into your MyChart account.  Go  to 'Visit' (or 'Appointments' if on mobile App) --> Schedule an Appointment  Under 'Select a Reason for Visit' choose the Mammogram Screening option.  Complete the pre-visit questions and select the time and place that best fits your schedule.     Preventive Care 4 Years and Older, Female Preventive care refers to lifestyle choices and visits with your health care provider that can promote health and wellness. What does preventive care include? A yearly physical exam. This is also called an annual well check. Dental exams once or twice a year. Routine eye exams. Ask your health care provider how often you should have your eyes checked. Personal lifestyle choices, including: Daily care of your teeth and gums. Regular physical activity. Eating a healthy diet. Avoiding tobacco and drug use. Limiting alcohol use. Practicing safe sex. Taking low-dose aspirin every day. Taking vitamin and mineral supplements as recommended by your health care provider. What happens during an annual well check? The services and screenings done by your health care provider during your annual well check will depend on your age, overall health, lifestyle risk factors, and family history of disease. Counseling  Your health care provider may ask you questions about your: Alcohol use. Tobacco use. Drug use. Emotional well-being. Home and relationship well-being. Sexual activity. Eating habits. History of falls. Memory and ability to understand (cognition). Work and work Astronomer.  Reproductive health. Screening  You may have the following tests or measurements: Height, weight, and BMI. Blood pressure. Lipid and cholesterol levels. These may be checked every 5 years, or more frequently if you are over 72 years old. Skin check. Lung cancer screening. You may have this screening every year starting at age 50 if you have a 30-pack-year history of smoking and currently smoke or have quit within the past  15 years. Fecal occult blood test (FOBT) of the stool. You may have this test every year starting at age 10. Flexible sigmoidoscopy or colonoscopy. You may have a sigmoidoscopy every 5 years or a colonoscopy every 10 years starting at age 60. Hepatitis C blood test. Hepatitis B blood test. Sexually transmitted disease (STD) testing. Diabetes screening. This is done by checking your blood sugar (glucose) after you have not eaten for a while (fasting). You may have this done every 1-3 years. Bone density scan. This is done to screen for osteoporosis. You may have this done starting at age 13. Mammogram. This may be done every 1-2 years. Talk to your health care provider about how often you should have regular mammograms. Talk with your health care provider about your test results, treatment options, and if necessary, the need for more tests. Vaccines  Your health care provider may recommend certain vaccines, such as: Influenza vaccine. This is recommended every year. Tetanus, diphtheria, and acellular pertussis (Tdap, Td) vaccine. You may need a Td booster every 10 years. Zoster vaccine. You may need this after age 17. Pneumococcal 13-valent conjugate (PCV13) vaccine. One dose is recommended after age 7. Pneumococcal polysaccharide (PPSV23) vaccine. One dose is recommended after age 85. Talk to your health care provider about which screenings and vaccines you need and how often you need them. This information is not intended to replace advice given to you by your health care provider. Make sure you discuss any questions you have with your health care provider. Document Released: 10/13/2015 Document Revised: 06/05/2016 Document Reviewed: 07/18/2015 Elsevier Interactive Patient Education  2017 [employer].  Fall Prevention in the Home Falls can cause injuries. They can happen to people of all ages. There are many things you can do to make your home safe and to help prevent falls. What can I do  on the outside of my home? Regularly fix the edges of walkways and driveways and fix any cracks. Remove anything that might make you trip as you walk through a door, such as a raised step or threshold. Trim any bushes or trees on the path to your home. Use bright outdoor lighting. Clear any walking paths of anything that might make someone trip, such as rocks or tools. Regularly check to see if handrails are loose or broken. Make sure that both sides of any steps have handrails. Any raised decks and porches should have guardrails on the edges. Have any leaves, snow, or ice cleared regularly. Use sand or salt on walking paths during winter. Clean up any spills in your garage right away. This includes oil or grease spills. What can I do in the bathroom? Use night lights. Install grab bars by the toilet and in the tub and shower. Do not use towel bars as grab bars. Use non-skid mats or decals in the tub or shower. If you need to sit down in the shower, use a plastic, non-slip stool. Keep the floor dry. Clean up any water that spills on the floor as soon as it happens. Remove soap buildup in the  tub or shower regularly. Attach bath mats securely with double-sided non-slip rug tape. Do not have throw rugs and other things on the floor that can make you trip. What can I do in the bedroom? Use night lights. Make sure that you have a light by your bed that is easy to reach. Do not use any sheets or blankets that are too big for your bed. They should not hang down onto the floor. Have a firm chair that has side arms. You can use this for support while you get dressed. Do not have throw rugs and other things on the floor that can make you trip. What can I do in the kitchen? Clean up any spills right away. Avoid walking on wet floors. Keep items that you use a lot in easy-to-reach places. If you need to reach something above you, use a strong step stool that has a grab bar. Keep electrical cords  out of the way. Do not use floor polish or wax that makes floors slippery. If you must use wax, use non-skid floor wax. Do not have throw rugs and other things on the floor that can make you trip. What can I do with my stairs? Do not leave any items on the stairs. Make sure that there are handrails on both sides of the stairs and use them. Fix handrails that are broken or loose. Make sure that handrails are as long as the stairways. Check any carpeting to make sure that it is firmly attached to the stairs. Fix any carpet that is loose or worn. Avoid having throw rugs at the top or bottom of the stairs. If you do have throw rugs, attach them to the floor with carpet tape. Make sure that you have a light switch at the top of the stairs and the bottom of the stairs. If you do not have them, ask someone to add them for you. What else can I do to help prevent falls? Wear shoes that: Do not have high heels. Have rubber bottoms. Are comfortable and fit you well. Are closed at the toe. Do not wear sandals. If you use a stepladder: Make sure that it is fully opened. Do not climb a closed stepladder. Make sure that both sides of the stepladder are locked into place. Ask someone to hold it for you, if possible. Clearly mark and make sure that you can see: Any grab bars or handrails. First and last steps. Where the edge of each step is. Use tools that help you move around (mobility aids) if they are needed. These include: Canes. Walkers. Scooters. Crutches. Turn on the lights when you go into a dark area. Replace any light bulbs as soon as they burn out. Set up your furniture so you have a clear path. Avoid moving your furniture around. If any of your floors are uneven, fix them. If there are any pets around you, be aware of where they are. Review your medicines with your doctor. Some medicines can make you feel dizzy. This can increase your chance of falling. Ask your doctor what other things  that you can do to help prevent falls. This information is not intended to replace advice given to you by your health care provider. Make sure you discuss any questions you have with your health care provider. Document Released: 07/13/2009 Document Revised: 02/22/2016 Document Reviewed: 10/21/2014 Elsevier Interactive Patient Education  2017 [employer].

## 2023-04-20 NOTE — Progress Notes (Addendum)
Subjective:   Jessica [name] is a 72 y.o. female who presents for Medicare Annual (Subsequent) preventive examination.  Visit Complete: Virtual  I connected with  Jessica [name] on 04/27/23 by a audio enabled telemedicine application and verified that I am speaking with the correct person using two identifiers.  Patient Location: Home  Provider Location: Home Office  I discussed the limitations of evaluation and management by telemedicine. The patient expressed understanding and agreed to proceed.  Review of Systems     Cardiac Risk Factors include: advanced age (>24men, >28 women);dyslipidemia;smoking/ tobacco exposure  Per patient no change in vitals since last visit, unable to obtain new vitals due to telehealth visit     Objective:    Today's Vitals   04/21/23 1008  Weight: 158 lb (71.7 kg)  Height: 5\' 2"  (1.575 m)   Body mass index is 28.9 kg/m.     04/21/2023   10:32 AM 01/06/2023   10:53 AM 10/03/2022   10:35 AM 08/14/2022   12:51 PM 08/05/2022    9:13 AM 05/23/2022    2:25 PM 04/16/2022   11:06 AM  Advanced Directives  Does Patient Have a Medical Advance Directive? Yes Yes No Yes Yes Yes Yes  Type of Estate agent of [place];Living will Living will   Living will Living will Healthcare Power of [place];Living will  Does patient want to make changes to medical advance directive? No - Patient declined      No - Patient declined  Copy of Healthcare Power of Attorney in Chart? Yes - validated most recent copy scanned in chart (See row information)      Yes - validated most recent copy scanned in chart (See row information)    Current Medications (verified) Outpatient Encounter Medications as of 04/21/2023  Medication Sig   albuterol (VENTOLIN HFA) 108 (90 Base) MCG/ACT inhaler Inhale 2 puffs into the lungs every 6 (six) hours as needed for wheezing or shortness of breath.   Calcium-Vitamins C & D (CALCIUM/C/D PO) Take 1 each by mouth 2 (two)  times daily. Good sense 1 chew twice a day   fluticasone (FLONASE) 50 MCG/ACT nasal spray Place 1 spray into both nostrils daily as needed for allergies.   Multiple Vitamin (MULTIVITAMIN ADULT PO) Take 1 tablet by mouth daily. Sentry -multivitamin take one daily   rosuvastatin (CRESTOR) 20 MG tablet TAKE 1 TABLET BY MOUTH ONCE DAILY IN THE EVENING FOR  CHOLESTEROL.   No facility-administered encounter medications on file as of 04/21/2023.    Allergies (verified) Chantix [varenicline tartrate]   History: Past Medical History:  Diagnosis Date   Allergy    seasonal allergies   Anemia    as a teenager   Arthrosis of right acromioclavicular joint 2015   Cataract    sx 2019   COPD (chronic obstructive pulmonary disease) (HCC)    infreq exac, continued tobacco abuse   Dyslipidemia    GERD (gastroesophageal reflux disease)    with certain foods   History of kidney stones    History of radiation therapy    Right lung- 08/27/22-09/03/22- Dr. Antony Blackbird   Hyperlipidemia    on meds   Microscopic hematuria 07/28/2019   Osteoarthritis of knee    R knee   Pneumonia    Seasonal allergies    Past Surgical History:  Procedure Laterality Date   ABDOMINAL HYSTERECTOMY  1992   APPENDECTOMY  1995   BRONCHIAL BIOPSY  08/05/2022   Procedure: BRONCHIAL BIOPSIES;  Surgeon: Leslye Peer, MD;  Location: [facility] ENDOSCOPY;  Service: Pulmonary;;   BRONCHIAL BRUSHINGS  08/05/2022   Procedure: BRONCHIAL BRUSHINGS;  Surgeon: Leslye Peer, MD;  Location: [facility] ENDOSCOPY;  Service: Pulmonary;;   BRONCHIAL NEEDLE ASPIRATION BIOPSY  08/05/2022   Procedure: BRONCHIAL NEEDLE ASPIRATION BIOPSIES;  Surgeon: Leslye Peer, MD;  Location: [facility] ENDOSCOPY;  Service: Pulmonary;;   BRONCHIAL WASHINGS  08/05/2022   Procedure: BRONCHIAL WASHINGS;  Surgeon: Leslye Peer, MD;  Location: [facility] ENDOSCOPY;  Service: Pulmonary;;   COLONOSCOPY     CYSTOSCOPY/URETEROSCOPY/HOLMIUM LASER/STENT PLACEMENT Right 08/03/2019    Procedure: CYSTOSCOPY/URETEROSCOPY/STENT PLACEMENT;  Surgeon: Noel Christmas, MD;  Location: [facility];  Service: Urology;  Laterality: Right;  90 MINS   CYSTOSCOPY/URETEROSCOPY/HOLMIUM LASER/STENT PLACEMENT Right 08/17/2019   Procedure: CYSTOSCOPY/URETEROSCOPY/HOLMIUM LASER/STENT EXCHANGED;  Surgeon: Noel Christmas, MD;  Location: [facility];  Service: Urology;  Laterality: Right;   FIDUCIAL MARKER PLACEMENT  08/05/2022   Procedure: FIDUCIAL MARKER PLACEMENT;  Surgeon: Leslye Peer, MD;  Location: [facility] ENDOSCOPY;  Service: Pulmonary;;   MOUTH SURGERY  2011   dental implants   POLYPECTOMY     right knee surgery  1988   "torn cartledge"   1998/2002   ROTATOR CUFF REPAIR Right 2015   TONSILLECTOMY  1956   TUBAL LIGATION  1980   VIDEO BRONCHOSCOPY WITH RADIAL ENDOBRONCHIAL ULTRASOUND  08/05/2022   Procedure: VIDEO BRONCHOSCOPY WITH RADIAL ENDOBRONCHIAL ULTRASOUND;  Surgeon: Leslye Peer, MD;  Location: [facility] ENDOSCOPY;  Service: Pulmonary;;   WISDOM TOOTH EXTRACTION  1970   Family History  Problem Relation Age of Onset   Hypertension Mother    Heart disease Mother    Colon polyps Mother 25   Multiple sclerosis Father    Cancer Brother        Pancreatic   Hypertension Brother    Breast cancer Maternal Aunt    Stroke Other    Colon cancer Neg Hx    Esophageal cancer Neg Hx    Rectal cancer Neg Hx    Stomach cancer Neg Hx    Crohn's disease Neg Hx    Social History   Socioeconomic History   Marital status: Married    Spouse name: Not on file   Number of children: Not on file   Years of education: Not on file   Highest education level: Not on file  Occupational History   Not on file  Tobacco Use   Smoking status: Every Day    Current packs/day: 0.25    Average packs/day: 0.3 packs/day for 51.0 years (12.8 ttl pk-yrs)    Types: Cigarettes    Passive exposure: Current   Smokeless tobacco: Never   Tobacco comments:    About 4 cigarettes per day 09/10/22 ARJ    Vaping Use   Vaping status: Never Used  Substance and Sexual Activity   Alcohol use: Yes    Alcohol/week: 0.0 standard drinks of alcohol    Comment: rarely   Drug use: No   Sexual activity: Not on file  Other Topics Concern   Not on file  Social History Narrative   Not on file   Social Determinants of Health   Financial Resource Strain: Low Risk  (04/21/2023)   Overall Financial Resource Strain (CARDIA)    Difficulty of Paying Living Expenses: Not hard at all  Food Insecurity: No Food Insecurity (04/21/2023)   Hunger Vital Sign    Worried About Running Out of Food in the Last Year:  Never true    Ran Out of Food in the Last Year: Never true  Transportation Needs: No Transportation Needs (04/21/2023)   PRAPARE - Administrator, Civil Service (Medical): No    Lack of Transportation (Non-Medical): No  Physical Activity: Inactive (04/21/2023)   Exercise Vital Sign    Days of Exercise per Week: 0 days    Minutes of Exercise per Session: 0 min  Stress: No Stress Concern Present (04/21/2023)   [employer] of Occupational Health - Occupational Stress Questionnaire    Feeling of Stress : Not at all  Social Connections: Moderately Isolated (04/21/2023)   Social Connection and Isolation Panel [NHANES]    Frequency of Communication with Friends and Family: More than three times a week    Frequency of Social Gatherings with Friends and Family: Three times a week    Attends Religious Services: Never    Active Member of Clubs or Organizations: No    Attends Banker Meetings: Never    Marital Status: Married    Tobacco Counseling Ready to quit: Not Answered Counseling given: Not Answered Tobacco comments: About 4 cigarettes per day 09/10/22 ARJ    Clinical Intake:  Pre-visit preparation completed: Yes  Pain : No/denies pain     Diabetes: No  How often do you need to have someone help you when you read instructions, pamphlets, or other written  materials from your doctor or pharmacy?: 1 - Never  Interpreter Needed?: No  Information entered by :: Kandis Fantasia LPN   Activities of Daily Living    04/21/2023   10:31 AM 08/02/2022   10:31 AM  In your present state of health, do you have any difficulty performing the following activities:  Hearing? 0 0  Vision? 0 0  Difficulty concentrating or making decisions? 0 0  Walking or climbing stairs? 0 0  Dressing or bathing? 0 0  Doing errands, shopping? 0 0  Preparing Food and eating ? N   Using the Toilet? N   In the past six months, have you accidently leaked urine? N   Do you have problems with loss of bowel control? N   Managing your Medications? N   Managing your Finances? N   Housekeeping or managing your Housekeeping? N     Patient Care Team: Doreene Nest, NP as PCP - General (Internal Medicine)  Indicate any recent Medical Services you may have received from other than [facility] providers in the past year (date may be approximate).     Assessment:   This is a routine wellness examination for [facility].  Hearing/Vision screen Hearing Screening - Comments:: Denies hearing difficulties   Vision Screening - Comments::  up to date with routine eye exams with [facility]    Dietary issues and exercise activities discussed:     Goals Addressed               This Visit's Progress     COMPLETED: No current goals (pt-stated)        COMPLETED: Patient Stated        02/01/2020, I will maintain and continue medications as prescribed.       COMPLETED: Patient Stated        04/12/2021, I will maintain and continue medications as prescribed.       Remain active and independent         Depression Screen    04/27/2023    6:38 PM 08/02/2022   10:30 AM  04/16/2022   11:02 AM 04/12/2021    3:33 PM 02/01/2020   11:17 AM 04/20/2018    7:51 PM 12/08/2015    8:52 AM  PHQ 2/9 Scores  PHQ - 2 Score 0 0 0 0 0 0 0  PHQ- 9 Score  0  0 0      Fall Risk    04/21/2023    10:31 AM 08/02/2022   10:31 AM 04/16/2022   11:05 AM 06/06/2021   11:55 AM 04/12/2021    3:33 PM  Fall Risk   Falls in the past year? 0 0 0 0 0  Number falls in past yr: 0 0 0 0 0  Injury with Fall? 0 0 0 0 0  Risk for fall due to : No Fall Risks  No Fall Risks  No Fall Risks  Follow up Falls prevention discussed;Education provided;Falls evaluation completed    Falls evaluation completed;Falls prevention discussed    MEDICARE RISK AT HOME:    TIMED UP AND GO:  Was the test performed?  No    Cognitive Function:    04/12/2021    3:36 PM 02/01/2020   11:20 AM  MMSE - Mini Mental State Exam  Orientation to time 5 5  Orientation to Place 5 5  Registration 3 3  Attention/ Calculation 5 5  Recall 3 3  Language- repeat 1 1        04/21/2023   10:32 AM 04/16/2022   11:06 AM  6CIT Screen  What Year? 0 points 0 points  What month? 0 points 0 points  What time? 0 points 0 points  Count back from 20 0 points 0 points  Months in reverse 0 points 0 points  Repeat phrase 0 points 0 points  Total Score 0 points 0 points    Immunizations Immunization History  Administered Date(s) Administered   Fluad Quad(high Dose 65+) 09/10/2022   Influenza Split 07/27/2012   Influenza, High Dose Seasonal PF 07/28/2018, 07/17/2019   Influenza-Unspecified 07/17/2019   PFIZER(Purple Top)SARS-COV-2 Vaccination 12/12/2019, 01/03/2020, 09/29/2020   Pneumococcal Conjugate-13 09/29/2015   Pneumococcal Polysaccharide-23 02/21/2012, 04/21/2020   Td 09/30/2001   Tdap 02/21/2012   Zoster, Live 02/09/2013    TDAP status: Due, Education has been provided regarding the importance of this vaccine. Advised may receive this vaccine at local pharmacy or Health Dept. Aware to provide a copy of the vaccination record if obtained from local pharmacy or Health Dept. Verbalized acceptance and understanding.  Pneumococcal vaccine status: Up to date  Covid-19 vaccine status: Information provided on how to obtain  vaccines.   Qualifies for Shingles Vaccine? Yes   Zostavax completed Yes   Shingrix Completed?: No.    Education has been provided regarding the importance of this vaccine. Patient has been advised to call insurance company to determine out of pocket expense if they have not yet received this vaccine. Advised may also receive vaccine at local pharmacy or Health Dept. Verbalized acceptance and understanding.  Screening Tests Health Maintenance  Topic Date Due   DTaP/Tdap/Td (3 - Td or Tdap) 02/20/2022   COVID-19 Vaccine (4 - 2023-24 season) 05/07/2023 (Originally 05/31/2022)   INFLUENZA VACCINE  05/01/2023   Medicare Annual Wellness (AWV)  04/20/2024   MAMMOGRAM  05/15/2024   Pneumonia Vaccine 64+ Years old  Completed   DEXA SCAN  Completed   Hepatitis C Screening  Completed   HPV VACCINES  Aged Out   Lung Cancer Screening  Discontinued   Colonoscopy  Discontinued  Fecal DNA (Cologuard)  Discontinued   Zoster Vaccines- Shingrix  Discontinued    Health Maintenance  Health Maintenance Due  Topic Date Due   DTaP/Tdap/Td (3 - Td or Tdap) 02/20/2022    Colorectal cancer screening: No longer required.   Mammogram status: Completed 05/15/22. Repeat every year  Bone Density status: Completed 05/09/21. Results reflect: Bone density results: OSTEOPENIA. Repeat every 2 years.  Lung Cancer Screening: (Low Dose CT Chest recommended if Age 49-80 years, 20 pack-year currently smoking OR have quit w/in 15years.) does qualify.   Lung Cancer Screening Referral: last 01/06/23  Additional Screening:  Hepatitis C Screening: does qualify; Completed 01/26/22  Vision Screening: Recommended annual ophthalmology exams for early detection of glaucoma and other disorders of the eye. Is the patient up to date with their annual eye exam?  Yes  Who is the provider or what is the name of the office in which the patient attends annual eye exams? [facility] If pt is not established with a provider, would  they like to be referred to a provider to establish care? No .   Dental Screening: Recommended annual dental exams for proper oral hygiene  Community Resource Referral / Chronic Care Management: CRR required this visit?  No   CCM required this visit?  No     Plan:     I have personally reviewed and noted the following in the patient's chart:   Medical and social history Use of alcohol, tobacco or illicit drugs  Current medications and supplements including opioid prescriptions. Patient is not currently taking opioid prescriptions. Functional ability and status Nutritional status Physical activity Advanced directives List of other physicians Hospitalizations, surgeries, and ER visits in previous 12 months Vitals Screenings to include cognitive, depression, and falls Referrals and appointments  In addition, I have reviewed and discussed with patient certain preventive protocols, quality metrics, and best practice recommendations. A written personalized care plan for preventive services as well as general preventive health recommendations were provided to patient.     Kandis Fantasia [place], [place]   1/61/0960   After Visit Summary: (MyChart) Due to this being a telephonic visit, the after visit summary with patients personalized plan was offered to patient via MyChart   Nurse Notes: Patient scheduled for office visit to discuss possible shingles

## 2023-04-21 ENCOUNTER — Ambulatory Visit (INDEPENDENT_AMBULATORY_CARE_PROVIDER_SITE_OTHER): Payer: Medicare Other

## 2023-04-21 VITALS — Ht 62.0 in | Wt 158.0 lb

## 2023-04-21 DIAGNOSIS — Z0000 Encounter for general adult medical examination without abnormal findings: Secondary | ICD-10-CM | POA: Diagnosis not present

## 2023-04-21 DIAGNOSIS — Z780 Asymptomatic menopausal state: Secondary | ICD-10-CM

## 2023-04-27 NOTE — Progress Notes (Signed)
Depression screening added for 04/21/23 visit due to missed documentation

## 2023-04-29 ENCOUNTER — Telehealth: Payer: Self-pay | Admitting: *Deleted

## 2023-04-29 ENCOUNTER — Encounter: Payer: Self-pay | Admitting: Primary Care

## 2023-04-29 ENCOUNTER — Telehealth: Payer: Self-pay

## 2023-04-29 ENCOUNTER — Ambulatory Visit (INDEPENDENT_AMBULATORY_CARE_PROVIDER_SITE_OTHER): Payer: Medicare Other | Admitting: Primary Care

## 2023-04-29 VITALS — BP 124/62 | HR 94 | Temp 97.3°F | Ht 62.0 in | Wt 155.0 lb

## 2023-04-29 DIAGNOSIS — R208 Other disturbances of skin sensation: Secondary | ICD-10-CM | POA: Diagnosis not present

## 2023-04-29 NOTE — Progress Notes (Signed)
Subjective:    Patient ID: Jessica [name], female    DOB: 09/22/1951, 72 y.o.   MRN: [id number]  Rash    Jessica [name] is a very pleasant 72 y.o. female with a history of right lower lobe lung cancer, COPD, multiple thyroid nodules, osteoporosis who presents today to discuss chest wall burning.   Her chest wall irritation is located under her right breast for which she noticed about 1 month ago. Initially she noticed some redness under the right breast which lasted for about 1 week, but this has since resolved.  Her irritation is painful and burning, and located to the site of her radiation maker. "It feels like it's deep inside the skin coming out".  She thought that maybe her bra had been rubbing the site causing irritation so she cut out the elastic and didn't notice improvement.   She's tried applying ice packs which helped slightly to the burning. Her husband had a mild case of shingles about 2 weeks ago. Her last round of radiation was in December 2023. She's not seen rashes or redness in 3 weeks.    Review of Systems  Skin:  Negative for color change, rash and wound.       Skin irritation   Neurological:  Negative for numbness.         Past Medical History:  Diagnosis Date   Allergy    seasonal allergies   Anemia    as a teenager   Arthrosis of right acromioclavicular joint 2015   Cataract    sx 2019   COPD (chronic obstructive pulmonary disease) (HCC)    infreq exac, continued tobacco abuse   Dyslipidemia    GERD (gastroesophageal reflux disease)    with certain foods   History of kidney stones    History of radiation therapy    Right lung- 08/27/22-09/03/22- Dr. Antony Blackbird   Hyperlipidemia    on meds   Microscopic hematuria 07/28/2019   Osteoarthritis of knee    R knee   Pneumonia    Seasonal allergies     Social History   Socioeconomic History   Marital status: Married    Spouse name: Not on file   Number of children: Not on file   Years  of education: Not on file   Highest education level: Not on file  Occupational History   Not on file  Tobacco Use   Smoking status: Every Day    Current packs/day: 0.25    Average packs/day: 0.3 packs/day for 51.0 years (12.8 ttl pk-yrs)    Types: Cigarettes    Passive exposure: Current   Smokeless tobacco: Never   Tobacco comments:    About 4 cigarettes per day 09/10/22 ARJ   Vaping Use   Vaping status: Never Used  Substance and Sexual Activity   Alcohol use: Yes    Alcohol/week: 0.0 standard drinks of alcohol    Comment: rarely   Drug use: No   Sexual activity: Not on file  Other Topics Concern   Not on file  Social History Narrative   Not on file   Social Determinants of Health   Financial Resource Strain: Low Risk  (04/21/2023)   Overall Financial Resource Strain (CARDIA)    Difficulty of Paying Living Expenses: Not hard at all  Food Insecurity: No Food Insecurity (04/21/2023)   Hunger Vital Sign    Worried About Running Out of Food in the Last Year: Never true    Ran Out of  Food in the Last Year: Never true  Transportation Needs: No Transportation Needs (04/21/2023)   PRAPARE - Administrator, Civil Service (Medical): No    Lack of Transportation (Non-Medical): No  Physical Activity: Inactive (04/21/2023)   Exercise Vital Sign    Days of Exercise per Week: 0 days    Minutes of Exercise per Session: 0 min  Stress: No Stress Concern Present (04/21/2023)   [employer] of Occupational Health - Occupational Stress Questionnaire    Feeling of Stress : Not at all  Social Connections: Moderately Isolated (04/21/2023)   Social Connection and Isolation Panel [NHANES]    Frequency of Communication with Friends and Family: More than three times a week    Frequency of Social Gatherings with Friends and Family: Three times a week    Attends Religious Services: Never    Active Member of Clubs or Organizations: No    Attends Banker Meetings: Never     Marital Status: Married  Catering manager Violence: Not At Risk (04/21/2023)   Humiliation, Afraid, Rape, and Kick questionnaire    Fear of Current or Ex-Partner: No    Emotionally Abused: No    Physically Abused: No    Sexually Abused: No    Past Surgical History:  Procedure Laterality Date   ABDOMINAL HYSTERECTOMY  1992   APPENDECTOMY  1995   BRONCHIAL BIOPSY  08/05/2022   Procedure: BRONCHIAL BIOPSIES;  Surgeon: Leslye Peer, MD;  Location: [facility] ENDOSCOPY;  Service: Pulmonary;;   BRONCHIAL BRUSHINGS  08/05/2022   Procedure: BRONCHIAL BRUSHINGS;  Surgeon: Leslye Peer, MD;  Location: [facility] ENDOSCOPY;  Service: Pulmonary;;   BRONCHIAL NEEDLE ASPIRATION BIOPSY  08/05/2022   Procedure: BRONCHIAL NEEDLE ASPIRATION BIOPSIES;  Surgeon: Leslye Peer, MD;  Location: [facility] ENDOSCOPY;  Service: Pulmonary;;   BRONCHIAL WASHINGS  08/05/2022   Procedure: BRONCHIAL WASHINGS;  Surgeon: Leslye Peer, MD;  Location: [facility] ENDOSCOPY;  Service: Pulmonary;;   COLONOSCOPY     CYSTOSCOPY/URETEROSCOPY/HOLMIUM LASER/STENT PLACEMENT Right 08/03/2019   Procedure: CYSTOSCOPY/URETEROSCOPY/STENT PLACEMENT;  Surgeon: Noel Christmas, MD;  Location: [facility]S;  Service: Urology;  Laterality: Right;  90 MINS   CYSTOSCOPY/URETEROSCOPY/HOLMIUM LASER/STENT PLACEMENT Right 08/17/2019   Procedure: CYSTOSCOPY/URETEROSCOPY/HOLMIUM LASER/STENT EXCHANGED;  Surgeon: Noel Christmas, MD;  Location: [facility];  Service: Urology;  Laterality: Right;   FIDUCIAL MARKER PLACEMENT  08/05/2022   Procedure: FIDUCIAL MARKER PLACEMENT;  Surgeon: Leslye Peer, MD;  Location: [facility] ENDOSCOPY;  Service: Pulmonary;;   MOUTH SURGERY  2011   dental implants   POLYPECTOMY     right knee surgery  1988   "torn cartledge"   1998/2002   ROTATOR CUFF REPAIR Right 2015   TONSILLECTOMY  1956   TUBAL LIGATION  1980   VIDEO BRONCHOSCOPY WITH RADIAL ENDOBRONCHIAL ULTRASOUND  08/05/2022   Procedure: VIDEO BRONCHOSCOPY WITH RADIAL  ENDOBRONCHIAL ULTRASOUND;  Surgeon: Leslye Peer, MD;  Location: [facility] ENDOSCOPY;  Service: Pulmonary;;   WISDOM TOOTH EXTRACTION  1970    Family History  Problem Relation Age of Onset   Hypertension Mother    Heart disease Mother    Colon polyps Mother 73   Multiple sclerosis Father    Cancer Brother        Pancreatic   Hypertension Brother    Breast cancer Maternal Aunt    Stroke Other    Colon cancer Neg Hx    Esophageal cancer Neg Hx    Rectal cancer Neg Hx  Stomach cancer Neg Hx    Crohn's disease Neg Hx     Allergies  Allergen Reactions   Chantix [Varenicline Tartrate] Other (See Comments)    Nausea,vomiting,spitting,when switched to higher dosage    Current Outpatient Medications on File Prior to Visit  Medication Sig Dispense Refill   albuterol (VENTOLIN HFA) 108 (90 Base) MCG/ACT inhaler Inhale 2 puffs into the lungs every 6 (six) hours as needed for wheezing or shortness of breath. 8 g 6   Calcium-Vitamins C & D (CALCIUM/C/D PO) Take 1 each by mouth 2 (two) times daily. Good sense 1 chew twice a day     fluticasone (FLONASE) 50 MCG/ACT nasal spray Place 1 spray into both nostrils daily as needed for allergies.     Multiple Vitamin (MULTIVITAMIN ADULT PO) Take 1 tablet by mouth daily. Sentry -multivitamin take one daily     rosuvastatin (CRESTOR) 20 MG tablet TAKE 1 TABLET BY MOUTH ONCE DAILY IN THE EVENING FOR  CHOLESTEROL. 90 tablet 3   No current facility-administered medications on file prior to visit.    BP 124/62   Pulse 94   Temp (!) 97.3 F (36.3 C) (Temporal)   Ht 5\' 2"  (1.575 m)   Wt 155 lb (70.3 kg)   SpO2 95%   BMI 28.35 kg/m  Objective:   Physical Exam Constitutional:      General: She is not in acute distress. Pulmonary:     Effort: Pulmonary effort is normal.  Skin:    General: Skin is warm and dry.     Findings: No erythema or rash.     Comments: No rash, lumps, or skin breakdown noted to site of concern.  Neurological:     Mental  Status: She is alert.           Assessment & Plan:  Burning sensation Assessment & Plan: Exam today without evidence of shingles, fungal rash, skin breakdown, cellulitis, etc.  Symptoms could be side effects from prior radiation therapy, especially since the site of her pain is at the prior radiation marker site. She will contact her radiology oncologist to discuss further.           Doreene Nest, NP

## 2023-04-29 NOTE — Telephone Encounter (Signed)
Jessica [name] called in to report having a painful burning sensation to right chest. Jessica [name] was seen by her PCP Dr. Chestine Spore on 04/29/23. Jessica [name] thought she had shingles. Per Dr. Chestine Spore burning and pain not related to shingles however Jessica [name] should follow up with radiation oncologist. Jessica [name] received 3 SBRT treatments to Right lung 08/27/22-09/03/22. Jessica [name] denies shortness of breath. Continues to have cough. Pls advise.

## 2023-04-29 NOTE — Telephone Encounter (Signed)
CALLED PATIENT TO ASK ABOUT SCHEDULING FU APPT. FOR 05-01-23, PATIENT AGREED TO COME ON 05-01-23 @ 3:30 PM

## 2023-04-29 NOTE — Assessment & Plan Note (Signed)
Exam today without evidence of shingles, fungal rash, skin breakdown, cellulitis, etc.  Symptoms could be side effects from prior radiation therapy, especially since the site of her pain is at the prior radiation marker site. She will contact her radiology oncologist to discuss further.

## 2023-04-29 NOTE — Patient Instructions (Signed)
Speak with Dr. Roselind Messier as discussed.  Let me know if I can help!  It was a pleasure to see you today!

## 2023-04-30 NOTE — Progress Notes (Signed)
Radiation Oncology         [phone]) [phone] ________________________________  Name: Jessica [name] MRN: [id number]  Date: 05/01/2023  DOB: [birth year]  Follow-Up Visit Note  CC: Doreene Nest, NP  Leslye Peer, MD  No diagnosis found.  Diagnosis: The primary encounter diagnosis was Solitary pulmonary nodule. A diagnosis of Primary cancer of right lower lobe of lung (HCC) was also pertinent to this visit   Adenocarcinoma of the right lower lobe; Stage IA2 (cT1b,cN0,cM0)    Interval Since Last Radiation: 7 months and 27 days   Intent: Curative  Radiation Treatment Dates: 08/27/2022 through 09/03/2022 Site Technique Total Dose (Gy) Dose per Fx (Gy) Completed Fx Beam Energies  Lung, Right: Lung_R IMRT 54/54 18 3/3 6XFFF   Narrative:  The patient returns today for follow-up. She was last seen here for follow-up on 01/06/23.   The patient was seen by her PCP on 04/29/23 for evaluation of a painful burning sensation to right chest area. Given her prior radiation to this area, her PCP advised that she bring this to our attention.   No other significant interval history since the patient was last seen for follow-up.   Of note: the patient presented for a thyroid US on 04/16/23 (for follow up of a right inferior thyroid nodule) which showed no significant interval change in the size or appearance of the previously biopsied nodule in the right lower gland. Four other thyroid nodules also showed interval stability, and have been stable since imaging performed last year.                 ***              Allergies:  is allergic to chantix [varenicline tartrate].  Meds: Current Outpatient Medications  Medication Sig Dispense Refill   albuterol (VENTOLIN HFA) 108 (90 Base) MCG/ACT inhaler Inhale 2 puffs into the lungs every 6 (six) hours as needed for wheezing or shortness of breath. 8 g 6   Calcium-Vitamins C & D (CALCIUM/C/D PO) Take 1 each by mouth 2 (two) times daily. Good sense 1  chew twice a day     fluticasone (FLONASE) 50 MCG/ACT nasal spray Place 1 spray into both nostrils daily as needed for allergies.     Multiple Vitamin (MULTIVITAMIN ADULT PO) Take 1 tablet by mouth daily. Sentry -multivitamin take one daily     rosuvastatin (CRESTOR) 20 MG tablet TAKE 1 TABLET BY MOUTH ONCE DAILY IN THE EVENING FOR  CHOLESTEROL. 90 tablet 3   No current facility-administered medications for this encounter.    Physical Findings: The patient is in no acute distress. Patient is alert and oriented.  vitals were not taken for this visit. .  No significant changes. Lungs are clear to auscultation bilaterally. Heart has regular rate and rhythm. No palpable cervical, supraclavicular, or axillary adenopathy. Abdomen soft, non-tender, normal bowel sounds.   Lab Findings: Lab Results  Component Value Date   WBC 6.9 08/05/2022   HGB 13.7 08/05/2022   HCT 41.7 08/05/2022   MCV 93.3 08/05/2022   PLT 163 08/05/2022    Radiographic Findings: US THYROID  Result Date: 04/18/2023 CLINICAL DATA:  Goiter. Biopsy of right inferior thyroid nodule performed 05/09/2022 EXAM: THYROID ULTRASOUND TECHNIQUE: Ultrasound examination of the thyroid gland and adjacent soft tissues was performed. COMPARISON:  Prior thyroid ultrasound 04/22/2022 FINDINGS: Parenchymal Echotexture: Moderately heterogenous Isthmus: 0.4 cm Right lobe: 4.7 x 1.5 x 2.7 cm Left lobe: 3.5 x 1.4 x 1.9 cm  _________________________________________________________ Estimated total number of nodules >/= 1 cm: 6-10 Number of spongiform nodules >/=  2 cm not described below (TR1): 0 Number of mixed cystic and solid nodules >/= 1.5 cm not described below (TR2): 0 _________________________________________________________ Nodule # 1: Unchanged solid hypoechoic nodule in the right upper gland measuring 1.2 x 0.6 x 0.9 cm. TI-RADS category 4. *Given size (>/= 1 - 1.4 cm) and appearance, a follow-up ultrasound in 1 year should be considered based  on TI-RADS criteria. Nodule # 2: Unchanged solid hypoechoic nodule in the more lateral aspect of the right upper gland measures 1.4 x 1.0 x 1.4 cm compared to 1.4 x 0.8 x 1.2 cm previously. TI-RADS category 4. *Given size (>/= 1 - 1.4 cm) and appearance, a follow-up ultrasound in 1 year should be considered based on TI-RADS criteria. Nodule # 3: Isoechoic solid nodule in the right mid gland. No further follow-up. Nodule # 4: Similar appearance of previously biopsied nodule in the right lower gland at 1.9 x 0.8 x 1.3 cm compared to 1.6 x 0.7 x 1.2 cm previously. Nodule # 5: Isoechoic solid nodule in the left upper gland measures up to 1.2 cm. No further follow-up. Nodule # 6: No significant interval change in the size or appearance of the 1.7 x 1.2 x 1.3 cm isoechoic solid nodule in the left mid gland. TI-RADS category 3. *Given size (>/= 1.5 - 2.4 cm) and appearance, a follow-up ultrasound in 1 year should be considered based on TI-RADS criteria. Nodule # 7: Similar appearance of isoechoic solid nodule measuring 1.6 x 1.0 x 1.9 cm in the inferior aspect of the left lower gland. TI-RADS category 3. *Given size (>/= 1.5 - 2.4 cm) and appearance, a follow-up ultrasound in 1 year should be considered based on TI-RADS criteria. IMPRESSION: 1. No significant interval change in the size or appearance of the previously biopsied nodule in the right lower gland. 2. Nodules # 1, 2, 6 and 7 are all grossly unchanged confirming 1 year of stability. All 4 lesions continue to meet criteria for imaging surveillance. Recommend follow-up ultrasound in 1 year. The above is in keeping with the ACR TI-RADS recommendations - J Am Coll Radiol 2017;14:587-595. Electronically Signed   By: Malachy Moan M.D.   On: 04/18/2023 15:45    Impression:  The primary encounter diagnosis was Solitary pulmonary nodule. A diagnosis of Primary cancer of right lower lobe of lung (HCC) was also pertinent to this visit   Adenocarcinoma of the right  lower lobe; Stage IA2 (cT1b,cN0,cM0)    The patient is recovering from the effects of radiation.  ***  Plan:  ***   *** minutes of total time was spent for this patient encounter, including preparation, face-to-face counseling with the patient and coordination of care, physical exam, and documentation of the encounter. ____________________________________  Billie Lade, PhD, MD  This document serves as a record of services personally performed by Antony Blackbird, MD. It was created on his behalf by Neena Rhymes, a trained medical scribe. The creation of this record is based on the scribe's personal observations and the provider's statements to them. This document has been checked and approved by the attending provider.

## 2023-05-01 ENCOUNTER — Encounter: Payer: Self-pay | Admitting: Radiation Oncology

## 2023-05-01 ENCOUNTER — Other Ambulatory Visit: Payer: Self-pay

## 2023-05-01 ENCOUNTER — Ambulatory Visit
Admission: RE | Admit: 2023-05-01 | Discharge: 2023-05-01 | Disposition: A | Discharge status: Home / Self Care | Payer: Medicare Other | Source: Ambulatory Visit | Attending: Radiation Oncology | Admitting: Radiation Oncology

## 2023-05-01 VITALS — BP 105/68 | HR 108 | Temp 97.1°F | Resp 20 | Ht 62.0 in | Wt 156.8 lb

## 2023-05-01 DIAGNOSIS — R911 Solitary pulmonary nodule: Secondary | ICD-10-CM | POA: Diagnosis not present

## 2023-05-01 DIAGNOSIS — R079 Chest pain, unspecified: Secondary | ICD-10-CM | POA: Insufficient documentation

## 2023-05-01 DIAGNOSIS — Z85118 Personal history of other malignant neoplasm of bronchus and lung: Secondary | ICD-10-CM | POA: Insufficient documentation

## 2023-05-01 DIAGNOSIS — C3431 Malignant neoplasm of lower lobe, right bronchus or lung: Secondary | ICD-10-CM | POA: Diagnosis not present

## 2023-05-01 DIAGNOSIS — E041 Nontoxic single thyroid nodule: Secondary | ICD-10-CM | POA: Diagnosis not present

## 2023-05-01 DIAGNOSIS — R208 Other disturbances of skin sensation: Secondary | ICD-10-CM

## 2023-05-01 DIAGNOSIS — Z923 Personal history of irradiation: Secondary | ICD-10-CM | POA: Insufficient documentation

## 2023-05-01 DIAGNOSIS — F17210 Nicotine dependence, cigarettes, uncomplicated: Secondary | ICD-10-CM | POA: Diagnosis not present

## 2023-05-01 DIAGNOSIS — Z79899 Other long term (current) drug therapy: Secondary | ICD-10-CM | POA: Diagnosis not present

## 2023-05-01 MED ORDER — GABAPENTIN 300 MG PO CAPS: 300.0000 mg | ORAL_CAPSULE | Freq: Three times a day (TID) | ORAL | 1 refills | Status: DC

## 2023-05-01 MED ORDER — GABAPENTIN 100 MG PO CAPS: 100.0000 mg | ORAL_CAPSULE | Freq: Three times a day (TID) | ORAL | 1 refills | Status: DC

## 2023-05-01 NOTE — Progress Notes (Signed)
Jessica [name] is here today for follow up post radiation to the lung.  Lung Side: Right,patient completed treatment on 09/03/22  Does the patient complain of any of the following: Pain:Underneath right breast Shortness of breath w/wo exertion: No Cough: Yes, but she says it is from her allergies Hemoptysis: No Pain with swallowing: No Swallowing/choking concerns: No concerns Appetite: Great Energy Level: Moderate Post radiation skin Changes: Some mild redness    Additional comments if applicable:

## 2023-05-12 NOTE — Telephone Encounter (Signed)
Patient called to report no relief from itching and burning to right chest since starting gabapentin on 05/01/23. Patient denies any skin rashes or other issues. Please advise

## 2023-05-22 DIAGNOSIS — Z1231 Encounter for screening mammogram for malignant neoplasm of breast: Secondary | ICD-10-CM | POA: Diagnosis not present

## 2023-05-22 LAB — HM MAMMOGRAPHY

## 2023-05-26 ENCOUNTER — Telehealth: Payer: Self-pay

## 2023-05-26 ENCOUNTER — Encounter: Payer: Self-pay | Admitting: Primary Care

## 2023-05-26 NOTE — Telephone Encounter (Signed)
Patients called in to get prescription renewed. Gabapentin 100 MG called in at [facility] pharmacy at [facility]

## 2023-05-27 ENCOUNTER — Other Ambulatory Visit: Payer: Self-pay | Admitting: Radiation Oncology

## 2023-05-27 MED ORDER — GABAPENTIN 100 MG PO CAPS: 100.0000 mg | ORAL_CAPSULE | Freq: Three times a day (TID) | ORAL | 1 refills | Status: DC

## 2023-06-16 ENCOUNTER — Telehealth: Payer: Self-pay | Admitting: *Deleted

## 2023-06-16 NOTE — Telephone Encounter (Signed)
CALLED PATIENT TO  INFORM OF CT FOR 07-08-23- ARRIVAL TIME- 12:15 PM @ [facility], NO RESTRICTIONS TO TEST, PATIENT TO RECEIVE CT RESULTS FROM DR. KINARD ON 07-10-23 @ 11AM, SPOKE WITH PATIENT AND SHE IS AWARE OF THESE APPTS. AND THE INSTRUCTIONS

## 2023-07-08 ENCOUNTER — Ambulatory Visit (HOSPITAL_COMMUNITY)
Admission: RE | Admit: 2023-07-08 | Discharge: 2023-07-08 | Disposition: A | Discharge status: Home / Self Care | Payer: Medicare Other | Source: Ambulatory Visit | Attending: Radiation Oncology | Admitting: Radiation Oncology

## 2023-07-08 DIAGNOSIS — C3431 Malignant neoplasm of lower lobe, right bronchus or lung: Secondary | ICD-10-CM | POA: Insufficient documentation

## 2023-07-08 DIAGNOSIS — I700 Atherosclerosis of aorta: Secondary | ICD-10-CM | POA: Diagnosis not present

## 2023-07-08 DIAGNOSIS — J439 Emphysema, unspecified: Secondary | ICD-10-CM | POA: Diagnosis not present

## 2023-07-09 NOTE — Progress Notes (Signed)
Radiation Oncology         [phone]) [phone] ________________________________  Name: Jessica [name] MRN: [id number]  Date: 07/10/2023  DOB: 1951/03/24  Follow-Up Visit Note  CC: Doreene Nest, NP  Leslye Peer, MD  No diagnosis found.  Diagnosis: The primary encounter diagnosis was Solitary pulmonary nodule. A diagnosis of Primary cancer of right lower lobe of lung (HCC) was also pertinent to this visit    Adenocarcinoma of the right lower lobe; Stage IA2 (cT1b,cN0,cM0)    Interval Since Last Radiation: 10 months and 5 days   Intent: Curative  Radiation Treatment Dates: 08/27/2022 through 09/03/2022 Site Technique Total Dose (Gy) Dose per Fx (Gy) Completed Fx Beam Energies  Lung, Right: Lung_R IMRT 54/54 18 3/3 6XFFF   Narrative:  The patient returns today for routine follow-up and to review recent imaging. She was last seen here for follow-up on 05/01/23.    Her most recent chest CT performed on 07/08/23 demonstrates no evidence of recurrent or metastatic disease and expected radiation changes in the RLL.   Of note: her most recent screening mammogram performed on 05/22/23 showed no evidence of malignancy in either breast.   ***                             Allergies:  is allergic to chantix [varenicline tartrate].  Meds: Current Outpatient Medications  Medication Sig Dispense Refill   albuterol (VENTOLIN HFA) 108 (90 Base) MCG/ACT inhaler Inhale 2 puffs into the lungs every 6 (six) hours as needed for wheezing or shortness of breath. 8 g 6   Calcium-Vitamins C & D (CALCIUM/C/D PO) Take 1 each by mouth 2 (two) times daily. Good sense 1 chew twice a day     fluticasone (FLONASE) 50 MCG/ACT nasal spray Place 1 spray into both nostrils daily as needed for allergies.     gabapentin (NEURONTIN) 100 MG capsule Take 1 capsule (100 mg total) by mouth 3 (three) times daily. 45 capsule 1   Multiple Vitamin (MULTIVITAMIN ADULT PO) Take 1 tablet by mouth daily. Sentry  -multivitamin take one daily     rosuvastatin (CRESTOR) 20 MG tablet TAKE 1 TABLET BY MOUTH ONCE DAILY IN THE EVENING FOR  CHOLESTEROL. 90 tablet 3   No current facility-administered medications for this encounter.    Physical Findings: The patient is in no acute distress. Patient is alert and oriented.  vitals were not taken for this visit. .  No significant changes. Lungs are clear to auscultation bilaterally. Heart has regular rate and rhythm. No palpable cervical, supraclavicular, or axillary adenopathy. Abdomen soft, non-tender, normal bowel sounds.   Lab Findings: Lab Results  Component Value Date   WBC 6.9 08/05/2022   HGB 13.7 08/05/2022   HCT 41.7 08/05/2022   MCV 93.3 08/05/2022   PLT 163 08/05/2022    Radiographic Findings: CT CHEST WO CONTRAST  Result Date: 07/09/2023 CLINICAL DATA:  Metastatic right lower lobe lung cancer EXAM: CT CHEST WITHOUT CONTRAST TECHNIQUE: Multidetector CT imaging of the chest was performed following the standard protocol without IV contrast. RADIATION DOSE REDUCTION: This exam was performed according to the departmental dose-optimization program which includes automated exposure control, adjustment of the mA and/or kV according to patient size and/or use of iterative reconstruction technique. COMPARISON:  01/03/2023 FINDINGS: Cardiovascular: The heart is normal in size. No pericardial effusion. No evidence of thoracic aortic aneurysm. Atherosclerotic calcifications of the aortic arch. Mild coronary atherosclerosis of  the LAD. Mediastinum/Nodes: No suspicious mediastinal lymphadenopathy. Visualized thyroid is unremarkable. Lungs/Pleura: Radiation changes in the right lower lobe with fiducial marker. Small perifissural nodule along the right minor fissure (series 5/image 70), unchanged. Stable clustered nodularity in the right upper lobe measuring up to 6 mm (series 5/image 46), benign. No new/suspicious pulmonary nodules. Moderate centrilobular and  paraseptal emphysematous changes, upper lung predominant. Faint ground-glass opacity/scarring in the left lower lobe (series 5/image 109), unchanged. No focal consolidation. No pleural effusion or pneumothorax. Upper Abdomen: Visualized upper abdomen notable for mild layering gallstones and vascular calcifications. Musculoskeletal: Degenerative changes of the visualized thoracolumbar spine. IMPRESSION: Radiation changes in the right lower lobe. No evidence of recurrent or metastatic disease. Aortic Atherosclerosis (ICD10-I70.0) and Emphysema (ICD10-J43.9). Electronically Signed   By: Charline Bills M.D.   On: 07/09/2023 09:52    Impression: The primary encounter diagnosis was Solitary pulmonary nodule. A diagnosis of Primary cancer of right lower lobe of lung (HCC) was also pertinent to this visit    Adenocarcinoma of the right lower lobe; Stage IA2 (cT1b,cN0,cM0)    The patient is recovering from the effects of radiation.  ***  Plan:  ***   *** minutes of total time was spent for this patient encounter, including preparation, face-to-face counseling with the patient and coordination of care, physical exam, and documentation of the encounter. ____________________________________  Billie Lade, PhD, MD  This document serves as a record of services personally performed by Antony Blackbird, MD. It was created on his behalf by Neena Rhymes, a trained medical scribe. The creation of this record is based on the scribe's personal observations and the provider's statements to them. This document has been checked and approved by the attending provider.

## 2023-07-10 ENCOUNTER — Ambulatory Visit
Admission: RE | Admit: 2023-07-10 | Discharge: 2023-07-10 | Disposition: A | Discharge status: Home / Self Care | Payer: Medicare Other | Source: Ambulatory Visit | Attending: Radiation Oncology | Admitting: Radiation Oncology

## 2023-07-10 ENCOUNTER — Encounter: Payer: Self-pay | Admitting: Radiation Oncology

## 2023-07-10 VITALS — BP 104/60 | HR 87 | Temp 96.8°F | Resp 18 | Ht 62.0 in | Wt 157.1 lb

## 2023-07-10 DIAGNOSIS — C3431 Malignant neoplasm of lower lobe, right bronchus or lung: Secondary | ICD-10-CM | POA: Diagnosis not present

## 2023-07-10 DIAGNOSIS — Z79899 Other long term (current) drug therapy: Secondary | ICD-10-CM | POA: Insufficient documentation

## 2023-07-10 DIAGNOSIS — Z85118 Personal history of other malignant neoplasm of bronchus and lung: Secondary | ICD-10-CM | POA: Insufficient documentation

## 2023-07-10 DIAGNOSIS — I700 Atherosclerosis of aorta: Secondary | ICD-10-CM | POA: Insufficient documentation

## 2023-07-10 DIAGNOSIS — I2510 Atherosclerotic heart disease of native coronary artery without angina pectoris: Secondary | ICD-10-CM | POA: Diagnosis not present

## 2023-07-10 DIAGNOSIS — Z923 Personal history of irradiation: Secondary | ICD-10-CM | POA: Diagnosis not present

## 2023-07-10 DIAGNOSIS — F17210 Nicotine dependence, cigarettes, uncomplicated: Secondary | ICD-10-CM | POA: Diagnosis not present

## 2023-07-10 DIAGNOSIS — R911 Solitary pulmonary nodule: Secondary | ICD-10-CM | POA: Diagnosis not present

## 2023-07-10 NOTE — Progress Notes (Signed)
Jessica [name] is here today for follow up post radiation to the lung.  Lung Side: Right,patient completed treatment on 09/03/22.  Does the patient complain of any of the following: Pain:No Shortness of breath w/wo exertion: No Cough: Yes, productive Hemoptysis: No Pain with swallowing: No Swallowing/choking concerns: No Appetite: Good Energy Level: Good Post radiation skin Changes: No    Additional comments if applicable:   BP 104/60 (BP Location: Left Arm, Patient Position: Sitting)   Pulse 87   Temp (!) 96.8 F (36 C) (Temporal)   Resp 18   Ht 5\' 2"  (1.575 m)   Wt 157 lb 2 oz (71.3 kg)   SpO2 100%   BMI 28.74 kg/m

## 2023-08-09 ENCOUNTER — Other Ambulatory Visit: Payer: Self-pay | Admitting: Primary Care

## 2023-08-09 DIAGNOSIS — E785 Hyperlipidemia, unspecified: Secondary | ICD-10-CM

## 2023-08-09 DIAGNOSIS — I700 Atherosclerosis of aorta: Secondary | ICD-10-CM

## 2023-08-10 NOTE — Telephone Encounter (Signed)
Patient is due for CPE/follow up, this will be required prior to any further refills.  Please schedule, thank you!   

## 2023-08-11 NOTE — Telephone Encounter (Signed)
Spoke to pt, scheduled cpe for 10/24/22

## 2023-09-04 ENCOUNTER — Ambulatory Visit (INDEPENDENT_AMBULATORY_CARE_PROVIDER_SITE_OTHER): Payer: Medicare Other | Admitting: Primary Care

## 2023-09-04 ENCOUNTER — Encounter: Payer: Self-pay | Admitting: Primary Care

## 2023-09-04 VITALS — BP 136/68 | HR 82 | Temp 97.8°F | Ht 68.0 in | Wt 159.0 lb

## 2023-09-04 DIAGNOSIS — J309 Allergic rhinitis, unspecified: Secondary | ICD-10-CM | POA: Insufficient documentation

## 2023-09-04 DIAGNOSIS — E042 Nontoxic multinodular goiter: Secondary | ICD-10-CM

## 2023-09-04 DIAGNOSIS — E2839 Other primary ovarian failure: Secondary | ICD-10-CM

## 2023-09-04 DIAGNOSIS — Z0000 Encounter for general adult medical examination without abnormal findings: Secondary | ICD-10-CM

## 2023-09-04 DIAGNOSIS — J439 Emphysema, unspecified: Secondary | ICD-10-CM

## 2023-09-04 DIAGNOSIS — Z23 Encounter for immunization: Secondary | ICD-10-CM

## 2023-09-04 DIAGNOSIS — M810 Age-related osteoporosis without current pathological fracture: Secondary | ICD-10-CM | POA: Diagnosis not present

## 2023-09-04 DIAGNOSIS — R208 Other disturbances of skin sensation: Secondary | ICD-10-CM | POA: Diagnosis not present

## 2023-09-04 DIAGNOSIS — C3431 Malignant neoplasm of lower lobe, right bronchus or lung: Secondary | ICD-10-CM | POA: Diagnosis not present

## 2023-09-04 DIAGNOSIS — E785 Hyperlipidemia, unspecified: Secondary | ICD-10-CM

## 2023-09-04 DIAGNOSIS — Z0001 Encounter for general adult medical examination with abnormal findings: Secondary | ICD-10-CM

## 2023-09-04 LAB — COMPREHENSIVE METABOLIC PANEL
ALT: 12 U/L (ref 0–35)
AST: 17 U/L (ref 0–37)
Albumin: 4.7 g/dL (ref 3.5–5.2)
Alkaline Phosphatase: 65 U/L (ref 39–117)
BUN: 21 mg/dL (ref 6–23)
CO2: 31 meq/L (ref 19–32)
Calcium: 9.5 mg/dL (ref 8.4–10.5)
Chloride: 103 meq/L (ref 96–112)
Creatinine, Ser: 1.08 mg/dL (ref 0.40–1.20)
GFR: 50.66 mL/min — ABNORMAL LOW (ref >60.00)
Glucose, Bld: 95 mg/dL (ref 70–99)
Potassium: 4.6 meq/L (ref 3.5–5.1)
Sodium: 142 meq/L (ref 135–145)
Total Bilirubin: 0.6 mg/dL (ref 0.2–1.2)
Total Protein: 7.6 g/dL (ref 6.0–8.3)

## 2023-09-04 LAB — CBC
HCT: 42 % (ref 36.0–46.0)
Hemoglobin: 14.3 g/dL (ref 12.0–15.0)
MCHC: 34 g/dL (ref 30.0–36.0)
MCV: 92.7 fL (ref 78.0–100.0)
Platelets: 171 10*3/uL (ref 150.0–400.0)
RBC: 4.53 Mil/uL (ref 3.87–5.11)
RDW: 14.7 % (ref 11.5–15.5)
WBC: 6 10*3/uL (ref 4.0–10.5)

## 2023-09-04 LAB — LIPID PANEL
Cholesterol: 176 mg/dL (ref 0–200)
HDL: 42.9 mg/dL (ref >39.00)
LDL Cholesterol: 76 mg/dL (ref 0–99)
NonHDL: 133.26
Total CHOL/HDL Ratio: 4
Triglycerides: 284 mg/dL — ABNORMAL HIGH (ref 0.0–149.0)
VLDL: 56.8 mg/dL — ABNORMAL HIGH (ref 0.0–40.0)

## 2023-09-04 MED ORDER — CETIRIZINE HCL 10 MG PO TABS: 10.0000 mg | ORAL_TABLET | Freq: Every day | ORAL | 3 refills | Status: DC

## 2023-09-04 NOTE — Assessment & Plan Note (Signed)
Prevnar 20 and influenza vaccines provided today.  We also discussed Shingrix vaccines and to obtain them from the pharmacy Mammogram up-to-date Colonoscopy UTD, no further screening needed given narrowed lumen to the left colon.  Discussed the importance of a healthy diet and regular exercise in order for weight loss, and to reduce the risk of further co-morbidity.  Exam stable. Labs pending.  Follow up in 1 year for repeat physical.

## 2023-09-04 NOTE — Assessment & Plan Note (Signed)
Continue calcium and vitamin D. Bone density scan due, orders placed.

## 2023-09-04 NOTE — Assessment & Plan Note (Signed)
Continue rosuvastatin 20 mg daily. Repeat lipid panel pending. 

## 2023-09-04 NOTE — Assessment & Plan Note (Signed)
Stable.  Repeat thyroid ultrasound for July 2025.

## 2023-09-04 NOTE — Assessment & Plan Note (Signed)
Stable.  No concerns today.  Continue albuterol inhaler PRN for which she uses sparingly. Reviewed CT chest from October 2024.

## 2023-09-04 NOTE — Assessment & Plan Note (Signed)
Determined to be secondary to radiation. Continue gabapentin 200 mg three times daily PRN.

## 2023-09-04 NOTE — Patient Instructions (Addendum)
Stop by the lab prior to leaving today. I will notify you of your results once received.   Call [facility] to schedule your bone density scan.  Start Zyrtec 10 mg every evening at bedtime for allergies.  It was a pleasure to see you today!

## 2023-09-04 NOTE — Assessment & Plan Note (Signed)
Following with radiology oncology, reviewed office notes and CT scan from October 2024.

## 2023-09-04 NOTE — Progress Notes (Signed)
Subjective:    Patient ID: Jessica [name], female    DOB: 1950/12/15, 72 y.o.   MRN: [id number]  HPI  Jessica [name] is a very pleasant 72 y.o. female who presents today for complete physical and follow up of chronic conditions.  Immunizations: -Tetanus: Completed in 2013 -Influenza: Influenza vaccine provided today.  -Shingles: Completed Zostavax -Pneumonia: Completed Prevnar 13 in 2016, Pneumovax 23 in 2021  Diet: Fair diet.  Exercise: No regular exercise.  Eye exam: Completes every other year Dental exam: Completed several years ago.   Mammogram: Completed in August 2024 Bone Density Scan: Completed in August 2022  Colonoscopy: Completed in 2023, no further colonoscopy recommended given narrowed lumen to the left colon Lung Cancer Screening: Completed in October 2024   BP Readings from Last 3 Encounters:  09/04/23 136/68  07/10/23 104/60  05/01/23 105/68      Review of Systems  Constitutional:  Negative for unexpected weight change.  HENT:  Positive for postnasal drip and rhinorrhea.   Respiratory:  Negative for cough and shortness of breath.   Cardiovascular:  Negative for chest pain.  Gastrointestinal:  Negative for constipation and diarrhea.  Genitourinary:  Negative for difficulty urinating.  Musculoskeletal:  Negative for arthralgias and myalgias.  Skin:  Negative for rash.  Allergic/Immunologic: Positive for environmental allergies.  Neurological:  Negative for dizziness and headaches.  Psychiatric/Behavioral:  The patient is not nervous/anxious.          Past Medical History:  Diagnosis Date   Allergy    seasonal allergies   Anemia    as a teenager   Arthrosis of right acromioclavicular joint 2015   Cataract    sx 2019   COPD (chronic obstructive pulmonary disease) (HCC)    infreq exac, continued tobacco abuse   Dyslipidemia    GERD (gastroesophageal reflux disease)    with certain foods   History of kidney stones    History of  radiation therapy    Right lung- 08/27/22-09/03/22- Dr. Antony Blackbird   Hyperlipidemia    on meds   Microscopic hematuria 07/28/2019   Osteoarthritis of knee    R knee   Pneumonia    Right elbow pain 05/17/2019   Seasonal allergies     Social History   Socioeconomic History   Marital status: Married    Spouse name: Not on file   Number of children: Not on file   Years of education: Not on file   Highest education level: Not on file  Occupational History   Not on file  Tobacco Use   Smoking status: Every Day    Current packs/day: 0.25    Average packs/day: 0.3 packs/day for 51.0 years (12.8 ttl pk-yrs)    Types: Cigarettes    Passive exposure: Current   Smokeless tobacco: Never   Tobacco comments:    About 4 cigarettes per day 09/10/22 [facility]   Vaping Use   Vaping status: Never Used  Substance and Sexual Activity   Alcohol use: Yes    Alcohol/week: 0.0 standard drinks of alcohol    Comment: rarely   Drug use: No   Sexual activity: Not on file  Other Topics Concern   Not on file  Social History Narrative   Not on file   Social Determinants of Health   Financial Resource Strain: Low Risk  (04/21/2023)   Overall Financial Resource Strain (CARDIA)    Difficulty of Paying Living Expenses: Not hard at all  Food Insecurity: No Food Insecurity (  04/21/2023)   Hunger Vital Sign    Worried About Running Out of Food in the Last Year: Never true    Ran Out of Food in the Last Year: Never true  Transportation Needs: No Transportation Needs (04/21/2023)   PRAPARE - Administrator, Civil Service (Medical): No    Lack of Transportation (Non-Medical): No  Physical Activity: Inactive (04/21/2023)   Exercise Vital Sign    Days of Exercise per Week: 0 days    Minutes of Exercise per Session: 0 min  Stress: No Stress Concern Present (04/21/2023)   [employer] of Occupational Health - Occupational Stress Questionnaire    Feeling of Stress : Not at all  Social  Connections: Moderately Isolated (04/21/2023)   Social Connection and Isolation Panel [NHANES]    Frequency of Communication with Friends and Family: More than three times a week    Frequency of Social Gatherings with Friends and Family: Three times a week    Attends Religious Services: Never    Active Member of Clubs or Organizations: No    Attends Banker Meetings: Never    Marital Status: Married  Catering manager Violence: Not At Risk (04/21/2023)   Humiliation, Afraid, Rape, and Kick questionnaire    Fear of Current or Ex-Partner: No    Emotionally Abused: No    Physically Abused: No    Sexually Abused: No    Past Surgical History:  Procedure Laterality Date   ABDOMINAL HYSTERECTOMY  1992   APPENDECTOMY  1995   BRONCHIAL BIOPSY  08/05/2022   Procedure: BRONCHIAL BIOPSIES;  Surgeon: Leslye Peer, MD;  Location: [facility] ENDOSCOPY;  Service: Pulmonary;;   BRONCHIAL BRUSHINGS  08/05/2022   Procedure: BRONCHIAL BRUSHINGS;  Surgeon: Leslye Peer, MD;  Location: [facility] ENDOSCOPY;  Service: Pulmonary;;   BRONCHIAL NEEDLE ASPIRATION BIOPSY  08/05/2022   Procedure: BRONCHIAL NEEDLE ASPIRATION BIOPSIES;  Surgeon: Leslye Peer, MD;  Location: [facility] ENDOSCOPY;  Service: Pulmonary;;   BRONCHIAL WASHINGS  08/05/2022   Procedure: BRONCHIAL WASHINGS;  Surgeon: Leslye Peer, MD;  Location: [facility] ENDOSCOPY;  Service: Pulmonary;;   COLONOSCOPY     CYSTOSCOPY/URETEROSCOPY/HOLMIUM LASER/STENT PLACEMENT Right 08/03/2019   Procedure: CYSTOSCOPY/URETEROSCOPY/STENT PLACEMENT;  Surgeon: Noel Christmas, MD;  Location: [facility];  Service: Urology;  Laterality: Right;  90 MINS   CYSTOSCOPY/URETEROSCOPY/HOLMIUM LASER/STENT PLACEMENT Right 08/17/2019   Procedure: CYSTOSCOPY/URETEROSCOPY/HOLMIUM LASER/STENT EXCHANGED;  Surgeon: Noel Christmas, MD;  Location: [facility];  Service: Urology;  Laterality: Right;   FIDUCIAL MARKER PLACEMENT  08/05/2022   Procedure: FIDUCIAL MARKER PLACEMENT;   Surgeon: Leslye Peer, MD;  Location: [facility] ENDOSCOPY;  Service: Pulmonary;;   MOUTH SURGERY  2011   dental implants   POLYPECTOMY     right knee surgery  1988   "torn cartledge"   1998/2002   ROTATOR CUFF REPAIR Right 2015   TONSILLECTOMY  1956   TUBAL LIGATION  1980   VIDEO BRONCHOSCOPY WITH RADIAL ENDOBRONCHIAL ULTRASOUND  08/05/2022   Procedure: VIDEO BRONCHOSCOPY WITH RADIAL ENDOBRONCHIAL ULTRASOUND;  Surgeon: Leslye Peer, MD;  Location: [facility] ENDOSCOPY;  Service: Pulmonary;;   WISDOM TOOTH EXTRACTION  1970    Family History  Problem Relation Age of Onset   Hypertension Mother    Heart disease Mother    Colon polyps Mother 48   Multiple sclerosis Father    Cancer Brother        Pancreatic   Hypertension Brother    Breast cancer Maternal Aunt  Stroke Other    Colon cancer Neg Hx    Esophageal cancer Neg Hx    Rectal cancer Neg Hx    Stomach cancer Neg Hx    Crohn's disease Neg Hx     Allergies  Allergen Reactions   Chantix [Varenicline Tartrate] Other (See Comments)    Nausea,vomiting,spitting,when switched to higher dosage    Current Outpatient Medications on File Prior to Visit  Medication Sig Dispense Refill   albuterol (VENTOLIN HFA) 108 (90 Base) MCG/ACT inhaler Inhale 2 puffs into the lungs every 6 (six) hours as needed for wheezing or shortness of breath. 8 g 6   Calcium-Vitamins C & D (CALCIUM/C/D PO) Take 1 each by mouth 2 (two) times daily. Good sense 1 chew twice a day     Multiple Vitamin (MULTIVITAMIN ADULT PO) Take 1 tablet by mouth daily. Sentry -multivitamin take one daily     rosuvastatin (CRESTOR) 20 MG tablet TAKE 1 TABLET BY MOUTH ONCE DAILY IN THE EVENING FOR CHOLESTEROL 90 tablet 0   fluticasone (FLONASE) 50 MCG/ACT nasal spray Place 1 spray into both nostrils daily as needed for allergies. (Patient not taking: Reported on 09/04/2023)     gabapentin (NEURONTIN) 100 MG capsule Take 1 capsule (100 mg total) by mouth 3 (three) times daily. (Patient  not taking: Reported on 09/04/2023) 45 capsule 1   No current facility-administered medications on file prior to visit.    BP 136/68   Pulse 82   Temp 97.8 F (36.6 C) (Temporal)   Ht 5\' 8"  (1.727 m)   Wt 159 lb (72.1 kg)   SpO2 97%   BMI 24.18 kg/m  Objective:   Physical Exam HENT:     Right Ear: Tympanic membrane and ear canal normal.     Left Ear: Tympanic membrane and ear canal normal.  Eyes:     Pupils: Pupils are equal, round, and reactive to light.  Cardiovascular:     Rate and Rhythm: Normal rate and regular rhythm.  Pulmonary:     Effort: Pulmonary effort is normal.     Breath sounds: Normal breath sounds.  Abdominal:     General: Bowel sounds are normal.     Palpations: Abdomen is soft.     Tenderness: There is no abdominal tenderness.  Musculoskeletal:        General: Normal range of motion.     Cervical back: Neck supple.  Skin:    General: Skin is warm and dry.  Neurological:     Mental Status: She is alert and oriented to person, place, and time.     Cranial Nerves: No cranial nerve deficit.     Deep Tendon Reflexes:     Reflex Scores:      Patellar reflexes are 2+ on the right side and 2+ on the left side. Psychiatric:        Mood and Affect: Mood normal.           Assessment & Plan:  Routine general medical examination at a health care facility Assessment & Plan: Prevnar 20 and influenza vaccines provided today.  We also discussed Shingrix vaccines and to obtain them from the pharmacy Mammogram up-to-date Colonoscopy UTD, no further screening needed given narrowed lumen to the left colon.  Discussed the importance of a healthy diet and regular exercise in order for weight loss, and to reduce the risk of further co-morbidity.  Exam stable. Labs pending.  Follow up in 1 year for repeat physical.  Pulmonary emphysema, unspecified emphysema type (HCC) Assessment & Plan: Stable.  No concerns today.  Continue albuterol inhaler PRN for  which she uses sparingly. Reviewed CT chest from October 2024.   Primary cancer of right lower lobe of lung [facility]) Assessment & Plan: Following with radiology oncology, reviewed office notes and CT scan from October 2024.    Multiple thyroid nodules Assessment & Plan: Stable.  Repeat thyroid ultrasound for July 2025.   Burning sensation Assessment & Plan: Determined to be secondary to radiation. Continue gabapentin 200 mg three times daily PRN.   Estrogen deficiency -     DG Bone Density; Future  Osteoporosis without current pathological fracture, unspecified osteoporosis type Assessment & Plan: Continue calcium and vitamin D. Bone density scan due, orders placed.   Dyslipidemia Assessment & Plan: Continue rosuvastatin 20 mg daily. Repeat lipid panel pending.  Orders: -     Lipid panel -     Comprehensive metabolic panel -     CBC  Allergic rhinitis, unspecified seasonality, unspecified trigger Assessment & Plan: Uncontrolled.  Start Zyrtec 10 mg HS. Discussed use of Flonase.  Orders: -     Cetirizine HCl; Take 1 tablet (10 mg total) by mouth at bedtime. For allergies  Dispense: 90 tablet; Refill: 3        Doreene Nest, NP

## 2023-09-04 NOTE — Assessment & Plan Note (Signed)
Uncontrolled.  Start Zyrtec 10 mg HS. Discussed use of Flonase.

## 2023-09-29 DIAGNOSIS — M8588 Other specified disorders of bone density and structure, other site: Secondary | ICD-10-CM | POA: Diagnosis not present

## 2023-09-29 LAB — HM DEXA SCAN

## 2023-11-06 ENCOUNTER — Other Ambulatory Visit: Payer: Self-pay | Admitting: Primary Care

## 2023-11-06 DIAGNOSIS — I700 Atherosclerosis of aorta: Secondary | ICD-10-CM

## 2023-11-06 DIAGNOSIS — E785 Hyperlipidemia, unspecified: Secondary | ICD-10-CM

## 2024-01-01 ENCOUNTER — Telehealth: Payer: Self-pay | Admitting: *Deleted

## 2024-01-01 NOTE — Telephone Encounter (Signed)
 CALLED PATIENT TO INFORM OF CT FOR 01-08-24- ARRIVAL TIME- 4 PM @ [facility], NO RESTRICTIONS TO SCAN, PATIENT TO RECEIVE RESULTS FROM DR. KINARD ON 01-12-24 @ 11:15 AM, SPOKE WITH PATIENT AND SHE IS AWARE OF THESE APPTS. AND THE INSTRUCTIONS

## 2024-01-08 ENCOUNTER — Ambulatory Visit (HOSPITAL_COMMUNITY)
Admission: RE | Admit: 2024-01-08 | Discharge: 2024-01-08 | Disposition: A | Discharge status: Home / Self Care | Source: Ambulatory Visit | Attending: Radiology | Admitting: Radiology

## 2024-01-08 DIAGNOSIS — C3431 Malignant neoplasm of lower lobe, right bronchus or lung: Secondary | ICD-10-CM | POA: Insufficient documentation

## 2024-01-08 DIAGNOSIS — K861 Other chronic pancreatitis: Secondary | ICD-10-CM | POA: Diagnosis not present

## 2024-01-08 DIAGNOSIS — E042 Nontoxic multinodular goiter: Secondary | ICD-10-CM | POA: Diagnosis not present

## 2024-01-11 NOTE — Progress Notes (Signed)
 Radiation Oncology         [phone] ________________________________  Name: Jessica [name] MRN: [id number]  Date: 01/12/2024  DOB: November 11, 1950  Follow-Up Visit Note  CC: Gabriel John, NP  Denson Flake, MD  No diagnosis found.  Diagnosis: The primary encounter diagnosis was Solitary pulmonary nodule. A diagnosis of Primary cancer of right lower lobe of lung (HCC) was also pertinent to this visit    Adenocarcinoma of the right lower lobe; Stage IA2 (cT1b,cN0,cM0)    Interval Since Last Radiation: 1 year, 4 months, and 9 days   Intent: Curative  Radiation Treatment Dates: 08/27/2022 through 09/03/2022 Site Technique Total Dose (Gy) Dose per Fx (Gy) Completed Fx Beam Energies  Lung, Right: Lung_R IMRT 54/54 18 3/3 [id number]    Narrative:  The patient returns today for routine follow-up and to review recent imaging. She was last seen here for follow-up on 07/10/23.      Her most recent chest CT performed on 01/08/24 demonstrates no definitive evidence of recurrent or metastatic disease, with post radiation scarring present in the RLL. Other notable findings include stability of the 5 mm RUL nodule and several previously demonstrated thyroid nodules measuring up to 2.3 cm.       Other pertinent imaging performed in the interval since her last visit includes a bone density study on 09/29/23 which showed findings consistent with osteoporosis.            No other significant interval history since the patient was last seen for follow-up.   ***             Allergies:  is allergic to chantix [varenicline tartrate].  Meds: Current Outpatient Medications  Medication Sig Dispense Refill   albuterol (VENTOLIN HFA) 108 (90 Base) MCG/ACT inhaler Inhale 2 puffs into the lungs every 6 (six) hours as needed for wheezing or shortness of breath. 8 g 6   Calcium-Vitamins C & D (CALCIUM/C/D PO) Take 1 each by mouth 2 (two) times daily. Good sense 1 chew twice a day     cetirizine  (ZYRTEC) 10 MG tablet Take 1 tablet (10 mg total) by mouth at bedtime. For allergies 90 tablet 3   fluticasone (FLONASE) 50 MCG/ACT nasal spray Place 1 spray into both nostrils daily as needed for allergies. (Patient not taking: Reported on 09/04/2023)     gabapentin (NEURONTIN) 100 MG capsule Take 1 capsule (100 mg total) by mouth 3 (three) times daily. (Patient not taking: Reported on 09/04/2023) 45 capsule 1   Multiple Vitamin (MULTIVITAMIN ADULT PO) Take 1 tablet by mouth daily. Sentry -multivitamin take one daily     rosuvastatin (CRESTOR) 20 MG tablet TAKE 1 TABLET BY MOUTH ONCE DAILY IN THE EVENING FOR CHOLESTEROL 90 tablet 2   No current facility-administered medications for this encounter.    Physical Findings: The patient is in no acute distress. Patient is alert and oriented.  vitals were not taken for this visit. .  No significant changes. Lungs are clear to auscultation bilaterally. Heart has regular rate and rhythm. No palpable cervical, supraclavicular, or axillary adenopathy. Abdomen soft, non-tender, normal bowel sounds.   Lab Findings: Lab Results  Component Value Date   WBC 6.0 09/04/2023   HGB 14.3 09/04/2023   HCT 42.0 09/04/2023   MCV 92.7 09/04/2023   PLT 171.0 09/04/2023    Radiographic Findings: CT Chest Wo Contrast Result Date: 01/09/2024 CLINICAL DATA:  Non-small cell lung cancer.  * Tracking Code: BO * EXAM: CT  CHEST WITHOUT CONTRAST TECHNIQUE: Multidetector CT imaging of the chest was performed following the standard protocol without IV contrast. RADIATION DOSE REDUCTION: This exam was performed according to the departmental dose-optimization program which includes automated exposure control, adjustment of the mA and/or kV according to patient size and/or use of iterative reconstruction technique. COMPARISON:  07/08/2023. FINDINGS: Cardiovascular: Atherosclerotic calcification of the aorta, aortic valve and coronary arteries. Heart size normal. No pericardial  effusion. Mediastinum/Nodes: Low-attenuation lesions in the thyroid measure up to 2.3 cm in the isthmus. No pathologically enlarged mediastinal or axillary lymph nodes. Hilar regions are difficult to definitively evaluate without IV contrast. Esophagus is grossly unremarkable. Lungs/Pleura: Minimal biapical pleuroparenchymal scarring. Centrilobular and paraseptal emphysema. 5 mm anterior segment right upper lobe nodule (6/47), unchanged. Post radiation architectural distortion surrounds a fiducial marker in the right lower lobe, stable. Calcified granulomas. Oblong subpleural nodule in the left lower lobe, unchanged and likely a subpleural lymph node. No pleural fluid. Debris in the airway. Upper Abdomen: Gallstones. Calcifications in the pancreas. Visualized portions of the liver, gallbladder, adrenal glands, kidneys, spleen, pancreas, stomach and bowel are otherwise grossly unremarkable. No upper abdominal adenopathy. Musculoskeletal: Degenerative changes in the spine. Osteopenia. Flowing anterior osteophytosis in the thoracic spine. IMPRESSION: 1. Post radiation scarring in the right lower lobe. No definitive evidence of recurrent or metastatic disease. 2. 5 mm right upper lobe nodule, stable. Recommend continued attention on follow-up. 3. Cholelithiasis. 4. Chronic calcific pancreatitis. 5. Thyroid nodules measure up to 2.3 cm. Previous thyroid ultrasound 04/16/2023. A one year follow-up ultrasound was recommended at that time. 6. Aortic atherosclerosis (ICD10-I70.0). Coronary artery calcification. 7.  Emphysema (ICD10-J43.9). Electronically Signed   By: Shearon Denis M.D.   On: 01/09/2024 15:44    Impression: The primary encounter diagnosis was Solitary pulmonary nodule. A diagnosis of Primary cancer of right lower lobe of lung (HCC) was also pertinent to this visit    Adenocarcinoma of the right lower lobe; Stage IA2 (cT1b,cN0,cM0)    The patient is recovering from the effects of radiation.   ***  Plan:  ***   *** minutes of total time was spent for this patient encounter, including preparation, face-to-face counseling with the patient and coordination of care, physical exam, and documentation of the encounter. ____________________________________  Noralee Beam, PhD, MD  This document serves as a record of services personally performed by Retta Caster, MD. It was created on his behalf by Aleta Anda, a trained medical scribe. The creation of this record is based on the scribe's personal observations and the provider's statements to them. This document has been checked and approved by the attending provider.

## 2024-01-12 ENCOUNTER — Ambulatory Visit
Admission: RE | Admit: 2024-01-12 | Discharge: 2024-01-12 | Disposition: A | Discharge status: Home / Self Care | Payer: Self-pay | Source: Ambulatory Visit | Attending: Radiation Oncology | Admitting: Radiation Oncology

## 2024-01-12 ENCOUNTER — Encounter: Payer: Self-pay | Admitting: Radiation Oncology

## 2024-01-12 VITALS — BP 137/65 | HR 87 | Temp 97.8°F | Resp 18 | Ht 68.0 in | Wt 159.0 lb

## 2024-01-12 DIAGNOSIS — M8580 Other specified disorders of bone density and structure, unspecified site: Secondary | ICD-10-CM | POA: Insufficient documentation

## 2024-01-12 DIAGNOSIS — Z79899 Other long term (current) drug therapy: Secondary | ICD-10-CM | POA: Insufficient documentation

## 2024-01-12 DIAGNOSIS — Z85118 Personal history of other malignant neoplasm of bronchus and lung: Secondary | ICD-10-CM | POA: Insufficient documentation

## 2024-01-12 DIAGNOSIS — K861 Other chronic pancreatitis: Secondary | ICD-10-CM | POA: Insufficient documentation

## 2024-01-12 DIAGNOSIS — J432 Centrilobular emphysema: Secondary | ICD-10-CM | POA: Insufficient documentation

## 2024-01-12 DIAGNOSIS — E041 Nontoxic single thyroid nodule: Secondary | ICD-10-CM | POA: Diagnosis not present

## 2024-01-12 DIAGNOSIS — C3431 Malignant neoplasm of lower lobe, right bronchus or lung: Secondary | ICD-10-CM

## 2024-01-12 DIAGNOSIS — Z923 Personal history of irradiation: Secondary | ICD-10-CM | POA: Insufficient documentation

## 2024-01-12 DIAGNOSIS — K8020 Calculus of gallbladder without cholecystitis without obstruction: Secondary | ICD-10-CM | POA: Diagnosis not present

## 2024-01-12 DIAGNOSIS — I700 Atherosclerosis of aorta: Secondary | ICD-10-CM | POA: Insufficient documentation

## 2024-01-12 DIAGNOSIS — F17210 Nicotine dependence, cigarettes, uncomplicated: Secondary | ICD-10-CM | POA: Diagnosis not present

## 2024-01-12 NOTE — Addendum Note (Signed)
 Encounter addended by: Pearlene Bouchard, PA-C on: 01/12/2024 2:55 PM  Actions taken: Follow-up modified

## 2024-01-12 NOTE — Progress Notes (Signed)
 Gerlean Kocher is here today for follow up post radiation to the lung.  Lung Side: Right Side  Does the patient complain of any of the following: Pain: She reports a burning sensation underneath right breast intermittently. Shortness of breath w/wo exertion: Denies Cough: Denies Hemoptysis: Denies Pain with swallowing: Denies Swallowing/choking concerns: Denies Appetite: Good Energy Level: Good Post radiation skin Changes: Denies   BP 137/65 (BP Location: Left Arm, Patient Position: Sitting, Cuff Size: Normal)   Pulse 87   Temp 97.8 F (36.6 C)   Resp 18   Ht 5\' 8"  (1.727 m)   Wt 159 lb (72.1 kg)   SpO2 100%   BMI 24.18 kg/m

## 2024-04-05 ENCOUNTER — Telehealth: Payer: Self-pay | Admitting: Primary Care

## 2024-04-05 DIAGNOSIS — E042 Nontoxic multinodular goiter: Secondary | ICD-10-CM

## 2024-04-05 NOTE — Telephone Encounter (Signed)
 Called and advised patient, she is agreeable to the US . Advised who to contact to get scheduled if she doesn't receive phone call.

## 2024-04-05 NOTE — Telephone Encounter (Addendum)
-----   Message from Comer MARLA Gaskins sent at 04/18/2023  5:22 PM EDT ----- Regarding: repeat thyroid  US    Needs repeat thyroid  US  for nodules. I have placed the order to [facility] on [address] which is where she had it done last July. She will receive a phone call for scheduling. Let me know if she disagrees.

## 2024-04-06 ENCOUNTER — Inpatient Hospital Stay
Admission: RE | Admit: 2024-04-06 | Discharge: 2024-04-06 | Discharge status: Home / Self Care | Source: Ambulatory Visit | Attending: Primary Care

## 2024-04-06 DIAGNOSIS — E042 Nontoxic multinodular goiter: Secondary | ICD-10-CM | POA: Diagnosis not present

## 2024-04-08 ENCOUNTER — Ambulatory Visit: Payer: Self-pay | Admitting: Primary Care

## 2024-04-09 ENCOUNTER — Other Ambulatory Visit: Payer: Self-pay | Admitting: Primary Care

## 2024-04-09 DIAGNOSIS — E042 Nontoxic multinodular goiter: Secondary | ICD-10-CM

## 2024-04-12 ENCOUNTER — Other Ambulatory Visit (HOSPITAL_COMMUNITY)
Admission: RE | Admit: 2024-04-12 | Discharge: 2024-04-12 | Disposition: A | Discharge status: Home / Self Care | Source: Ambulatory Visit | Attending: Student | Admitting: Student

## 2024-04-12 ENCOUNTER — Ambulatory Visit
Admission: RE | Admit: 2024-04-12 | Discharge: 2024-04-12 | Disposition: A | Discharge status: Home / Self Care | Source: Ambulatory Visit | Attending: Primary Care | Admitting: Primary Care

## 2024-04-12 DIAGNOSIS — E042 Nontoxic multinodular goiter: Secondary | ICD-10-CM

## 2024-04-14 LAB — CYTOLOGY - NON PAP

## 2024-04-15 ENCOUNTER — Ambulatory Visit: Payer: Self-pay | Admitting: Primary Care

## 2024-04-22 ENCOUNTER — Ambulatory Visit: Payer: Medicare Other

## 2024-04-22 VITALS — Ht 62.0 in | Wt 159.0 lb

## 2024-04-22 DIAGNOSIS — Z0000 Encounter for general adult medical examination without abnormal findings: Secondary | ICD-10-CM

## 2024-04-22 NOTE — Progress Notes (Signed)
 Subjective:   Jessica [name] is a 73 y.o. who presents for a Medicare Wellness preventive visit.  As a reminder, Annual Wellness Visits don't include a physical exam, and some assessments may be limited, especially if this visit is performed virtually. We may recommend an in-person follow-up visit with your provider if needed.  Visit Complete: Virtual I connected with  Jessica [name] on 04/22/24 by a audio enabled telemedicine application and verified that I am speaking with the correct person using two identifiers.  Patient Location: Home  Provider Location: Home Office  I discussed the limitations of evaluation and management by telemedicine. The patient expressed understanding and agreed to proceed.  Vital Signs: Because this visit was a virtual/telehealth visit, some criteria may be missing or patient reported. Any vitals not documented were not able to be obtained and vitals that have been documented are patient reported.  VideoDeclined- This patient declined Librarian, academic. Therefore the visit was completed with audio only.  Persons Participating in Visit: Patient.  AWV Questionnaire: No: Patient Medicare AWV questionnaire was not completed prior to this visit.  Cardiac Risk Factors include: advanced age (>68men, >30 women);dyslipidemia;sedentary lifestyle;smoking/ tobacco exposure     Objective:    Today's Vitals   04/22/24 1017  Weight: 159 lb (72.1 kg)  Height: 5' 2 (1.575 m)   Body mass index is 29.08 kg/m.     04/22/2024   10:29 AM 01/12/2024   11:08 AM 07/10/2023   10:55 AM 05/01/2023    3:20 PM 04/21/2023   10:32 AM 01/06/2023   10:53 AM 10/03/2022   10:35 AM  Advanced Directives  Does Patient Have a Medical Advance Directive? Yes Yes Yes Yes Yes Yes No  Type of Estate agent of [place];Living will Living will;Healthcare Power of [employer] Power of [place];Living will Living will   Does  patient want to make changes to medical advance directive?   No - Patient declined No - Patient declined No - Patient declined    Copy of Healthcare Power of Attorney in Chart? Yes - validated most recent copy scanned in chart (See row information)    Yes - validated most recent copy scanned in chart (See row information)    Would patient like information on creating a medical advance directive?   No - Patient declined        Current Medications (verified) Outpatient Encounter Medications as of 04/22/2024  Medication Sig   albuterol  (VENTOLIN  HFA) 108 (90 Base) MCG/ACT inhaler Inhale 2 puffs into the lungs every 6 (six) hours as needed for wheezing or shortness of breath.   Calcium -Vitamins C & D (CALCIUM /C/D PO) Take 1 each by mouth 2 (two) times daily. Good sense 1 chew twice a day   cetirizine  (ZYRTEC ) 10 MG tablet Take 1 tablet (10 mg total) by mouth at bedtime. For allergies   fluticasone  (FLONASE ) 50 MCG/ACT nasal spray Place 1 spray into both nostrils daily as needed for allergies.   gabapentin  (NEURONTIN ) 100 MG capsule Take 1 capsule (100 mg total) by mouth 3 (three) times daily.   Multiple Vitamin (MULTIVITAMIN ADULT PO) Take 1 tablet by mouth daily. Sentry -multivitamin take one daily   rosuvastatin  (CRESTOR ) 20 MG tablet TAKE 1 TABLET BY MOUTH ONCE DAILY IN THE EVENING FOR CHOLESTEROL   No facility-administered encounter medications on file as of 04/22/2024.    Allergies (verified) Chantix  [varenicline  tartrate]   History: Past Medical History:  Diagnosis Date  Allergy    seasonal allergies   Anemia    as a teenager   Arthrosis of right acromioclavicular joint 2015   Cataract    sx 2019   COPD (chronic obstructive pulmonary disease) (HCC)    infreq exac, continued tobacco abuse   Dyslipidemia    GERD (gastroesophageal reflux disease)    with certain foods   History of kidney stones    History of radiation therapy    Right lung- 08/27/22-09/03/22- Dr. Lynwood Nasuti    Hyperlipidemia    on meds   Microscopic hematuria 07/28/2019   Osteoarthritis of knee    R knee   Pneumonia    Right elbow pain 05/17/2019   Seasonal allergies    Past Surgical History:  Procedure Laterality Date   ABDOMINAL HYSTERECTOMY  1992   APPENDECTOMY  1995   BRONCHIAL BIOPSY  08/05/2022   Procedure: BRONCHIAL BIOPSIES;  Surgeon: Shelah Lamar RAMAN, MD;  Location: [facility] ENDOSCOPY;  Service: Pulmonary;;   BRONCHIAL BRUSHINGS  08/05/2022   Procedure: BRONCHIAL BRUSHINGS;  Surgeon: Shelah Lamar RAMAN, MD;  Location: [facility] ENDOSCOPY;  Service: Pulmonary;;   BRONCHIAL NEEDLE ASPIRATION BIOPSY  08/05/2022   Procedure: BRONCHIAL NEEDLE ASPIRATION BIOPSIES;  Surgeon: Shelah Lamar RAMAN, MD;  Location: [facility] ENDOSCOPY;  Service: Pulmonary;;   BRONCHIAL WASHINGS  08/05/2022   Procedure: BRONCHIAL WASHINGS;  Surgeon: Shelah Lamar RAMAN, MD;  Location: [facility] ENDOSCOPY;  Service: Pulmonary;;   COLONOSCOPY     CYSTOSCOPY/URETEROSCOPY/HOLMIUM LASER/STENT PLACEMENT Right 08/03/2019   Procedure: CYSTOSCOPY/URETEROSCOPY/STENT PLACEMENT;  Surgeon: Elisabeth Valli BIRCH, MD;  Location: [facility] ORS;  Service: Urology;  Laterality: Right;  90 MINS   CYSTOSCOPY/URETEROSCOPY/HOLMIUM LASER/STENT PLACEMENT Right 08/17/2019   Procedure: CYSTOSCOPY/URETEROSCOPY/HOLMIUM LASER/STENT EXCHANGED;  Surgeon: Elisabeth Valli BIRCH, MD;  Location: [facility];  Service: Urology;  Laterality: Right;   FIDUCIAL MARKER PLACEMENT  08/05/2022   Procedure: FIDUCIAL MARKER PLACEMENT;  Surgeon: Shelah Lamar RAMAN, MD;  Location[facility] END[facility]COPY;  Service: Pulmonary;;   MOUTH SURGERY  2011   dental implants   POLYPECTOMY     right knee surgery  1988   torn cartledge   1998/2002   ROTATOR CUFF REPAIR Right 2015   TONSILLECTOMY  1956   TUBAL LIGATION  1980   VIDEO BRONCHOSCOPY WITH RADIAL ENDOBRONCHIAL ULTRASOUND  08/05/2022   Procedure: VIDEO BRONCHOSCOPY WITH RADIAL ENDOBRONCHIAL ULTRASOUND;  Surgeon: Shelah Lamar RAMAN, MD;  Location: [facility] ENDOSCOPY;   Service: Pulmonary;;   WISDOM TOOTH EXTRACTION  1970   Family History  Problem Relation Age of Onset   Hypertension Mother    Heart disease Mother    Colon polyps Mother 77   Multiple sclerosis Father    Cancer Brother        Pancreatic   Hypertension Brother    Breast cancer Maternal Aunt    Stroke Other    Colon cancer Neg Hx    Esophageal cancer Neg Hx    Rectal cancer Neg Hx    Stomach cancer Neg Hx    Crohn's disease Neg Hx    Social History   Socioeconomic History   Marital status: Married    Spouse name: Not on file   Number of children: Not on file   Years of education: Not on file   Highest education level: Not on file  Occupational History   Not on file  Tobacco Use   Smoking status: Every Day    Current packs/day: 0.25    Average packs/day: 0.3 packs/day for 51.0 years (12.8  ttl pk-yrs)    Types: Cigarettes    Passive exposure: Current   Smokeless tobacco: Never   Tobacco comments:    About 4 cigarettes per day 09/10/22 ARJ   Vaping Use   Vaping status: Never Used  Substance and Sexual Activity   Alcohol use: Yes    Alcohol/week: 0.0 standard drinks of alcohol    Comment: rarely   Drug use: No   Sexual activity: Not on file  Other Topics Concern   Not on file  Social History Narrative   Not on file   Social Drivers of Health   Financial Resource Strain: Low Risk  (04/22/2024)   Overall Financial Resource Strain (CARDIA)    Difficulty of Paying Living Expenses: Not hard at all  Food Insecurity: No Food Insecurity (04/22/2024)   Hunger Vital Sign    Worried About Running Out of Food in the Last Year: Never true    Ran Out of Food in the Last Year: Never true  Transportation Needs: No Transportation Needs (04/22/2024)   PRAPARE - Administrator, Civil Service (Medical): No    Lack of Transportation (Non-Medical): No  Physical Activity: Inactive (04/22/2024)   Exercise Vital Sign    Days of Exercise per Week: 0 days    Minutes of  Exercise per Session: 0 min  Stress: No Stress Concern Present (04/22/2024)   [employer] of Occupational Health - Occupational Stress Questionnaire    Feeling of Stress: Not at all  Social Connections: Moderately Isolated (04/22/2024)   Social Connection and Isolation Panel    Frequency of Communication with Friends and Family: More than three times a week    Frequency of Social Gatherings with Friends and Family: Twice a week    Attends Religious Services: Never    Database administrator or Organizations: No    Attends Engineer, structural: Not on file    Marital Status: Married    Tobacco Counseling Ready to quit: Not Answered Counseling given: Not Answered Tobacco comments: About 4 cigarettes per day 09/10/22 ARJ     Clinical Intake:  Pre-visit preparation completed: Yes  Pain : No/denies pain   BMI - recorded: 29.08 Nutritional Status: BMI 25 -29 Overweight Nutritional Risks: None Diabetes: No  Lab Results  Component Value Date   HGBA1C 5.5 10/02/2022   HGBA1C 5.4 04/21/2020   HGBA1C 5.6 04/20/2018     How often do you need to have someone help you when you read instructions, pamphlets, or other written materials from your doctor or pharmacy?: 1 - Never  Interpreter Needed?: No  Comments: lives with husband Information entered by :: B.Cyndal Kasson,LPN   Activities of Daily Living     04/22/2024   10:29 AM  In your present state of health, do you have any difficulty performing the following activities:  Hearing? 0  Vision? 0  Difficulty concentrating or making decisions? 0  Walking or climbing stairs? 0  Dressing or bathing? 0  Doing errands, shopping? 0  Preparing Food and eating ? N  Using the Toilet? N  In the past six months, have you accidently leaked urine? N  Do you have problems with loss of bowel control? N  Managing your Medications? N  Managing your Finances? N  Housekeeping or managing your Housekeeping? N    Patient Care  Team: Gretta Comer POUR, NP as PCP - General (Internal Medicine) Brinda Elsie BRAVO, OD (Optometry)  I have updated your Care Teams any recent Medical  Services you may have received from other providers in the past year.     Assessment:   This is a routine wellness examination for [facility].  Hearing/Vision screen Hearing Screening - Comments:: Pt says her hearing is good Vision Screening - Comments:: Pt says her vision is good w/glasses and readers [facility]   Goals Addressed             This Visit's Progress    Remain active and independent   On track    04/22/24-Pt wants to continue       Depression Screen     04/22/2024   10:26 AM 09/04/2023   10:52 AM 04/27/2023    6:38 PM 08/02/2022   10:30 AM 04/16/2022   11:02 AM 04/12/2021    3:33 PM 02/01/2020   11:17 AM  PHQ 2/9 Scores  PHQ - 2 Score 0 0 0 0 0 0 0  PHQ- 9 Score    0  0 0    Fall Risk     04/22/2024   10:22 AM 09/04/2023   10:52 AM 04/29/2023   11:10 AM 04/21/2023   10:31 AM 08/02/2022   10:31 AM  Fall Risk   Falls in the past year? 0 0 0 0 0  Number falls in past yr: 0 0 0 0 0  Injury with Fall? 0 0 0 0 0  Risk for fall due to : No Fall Risks No Fall Risks No Fall Risks No Fall Risks   Follow up Education provided;Falls prevention discussed Falls evaluation completed Falls evaluation completed Falls prevention discussed;Education provided;Falls evaluation completed     MEDICARE RISK AT HOME:  Medicare Risk at Home Any stairs in or around the home?: Yes If so, are there any without handrails?: Yes Home free of loose throw rugs in walkways, pet beds, electrical cords, etc?: Yes Adequate lighting in your home to reduce risk of falls?: Yes Life alert?: No Use of a cane, walker or w/c?: No Grab bars in the bathroom?: No Shower chair or bench in shower?: No Elevated toilet seat or a handicapped toilet?: No  TIMED UP AND GO:  Was the test performed?  No  Cognitive Function: 6CIT completed    04/12/2021     3:36 PM 02/01/2020   11:20 AM  MMSE - Mini Mental State Exam  Orientation to time 5 5  Orientation to Place 5 5  Registration 3 3  Attention/ Calculation 5 5  Recall 3 3  Language- repeat 1 1        04/22/2024   10:31 AM 04/21/2023   10:32 AM 04/16/2022   11:06 AM  6CIT Screen  What Year? 0 points 0 points 0 points  What month? 0 points 0 points 0 points  What time? 0 points 0 points 0 points  Count back from 20 0 points 0 points 0 points  Months in reverse 0 points 0 points 0 points  Repeat phrase 0 points 0 points 0 points  Total Score 0 points 0 points 0 points    Immunizations Immunization History  Administered Date(s) Administered   Fluad Quad(high Dose 65+) 09/10/2022   Fluad Trivalent(High Dose 65+) 09/04/2023   Influenza Split 07/27/2012   Influenza, High Dose Seasonal PF 07/28/2018, 07/17/2019   Influenza-Unspecified 07/17/2019   PFIZER(Purple Top)SARS-COV-2 Vaccination 12/12/2019, 01/03/2020, 09/29/2020   PNEUMOCOCCAL CONJUGATE-20 09/04/2023   Pneumococcal Conjugate-13 09/29/2015   Pneumococcal Polysaccharide-23 02/21/2012, 04/21/2020   Td 09/30/2001   Tdap 02/21/2012   Zoster,  Live 02/09/2013    Screening Tests Health Maintenance  Topic Date Due   COVID-19 Vaccine (4 - 2024-25 season) 06/01/2023   DTaP/Tdap/Td (3 - Td or Tdap) 09/03/2024 (Originally 02/20/2022)   INFLUENZA VACCINE  04/30/2024   Medicare Annual Wellness (AWV)  04/22/2025   MAMMOGRAM  05/21/2025   Pneumococcal Vaccine: 50+ Years  Completed   DEXA SCAN  Completed   Hepatitis C Screening  Completed   Hepatitis B Vaccines  Aged Out   HPV VACCINES  Aged Out   Meningococcal B Vaccine  Aged Out   Lung Cancer Screening  Discontinued   Colonoscopy  Discontinued   Fecal DNA (Cologuard)  Discontinued   Zoster Vaccines- Shingrix  Discontinued    Health Maintenance  Health Maintenance Due  Topic Date Due   COVID-19 Vaccine (4 - 2024-25 season) 06/01/2023   Health Maintenance Items  Addressed: None needed at this time  Additional Screening:  Vision Screening: Recommended annual ophthalmology exams for early detection of glaucoma and other disorders of the eye. Would you like a referral to an eye doctor? No    Dental Screening: Recommended annual dental exams for proper oral hygiene  Community Resource Referral / Chronic Care Management: CRR required this visit?  No   CCM required this visit?  Appt scheduled with PCP   Plan:    I have personally reviewed and noted the following in the patient's chart:   Medical and social history Use of alcohol, tobacco or illicit drugs  Current medications and supplements including opioid prescriptions. Patient is not currently taking opioid prescriptions. Functional ability and status Nutritional status Physical activity Advanced directives List of other physicians Hospitalizations, surgeries, and ER visits in previous 12 months Vitals Screenings to include cognitive, depression, and falls Referrals and appointments  In addition, I have reviewed and discussed with patient certain preventive protocols, quality metrics, and best practice recommendations. A written personalized care plan for preventive services as well as general preventive health recommendations were provided to patient.   Erminio LITTIE Saris, LPN   [id number]   After Visit Summary: (MyChart) Due to this being a telephonic visit, the after visit summary with patients personalized plan was offered to patient via MyChart   Notes: Please refer to Routing Comments.

## 2024-04-22 NOTE — Patient Instructions (Signed)
 [name] , Thank you for taking time out of your busy schedule to complete your Annual Wellness Visit with me. I enjoyed our conversation and look forward to speaking with you again next year. I, as well as your care team,  appreciate your ongoing commitment to your health goals. Please review the following plan we discussed and let me know if I can assist you in the future. Your Game plan/ To Do List     Follow up Visits: Next Medicare AWV with our clinical staff: 04/25/25 @ 10:10am televisit   Have you seen your provider in the last 6 months (3 months if uncontrolled diabetes)? No Next Office Visit with your provider: 09/07/24 @ 11:05am televisit  Clinician Recommendations:  Aim for 30 minutes of exercise or brisk walking, 6-8 glasses of water, and 5 servings of fruits and vegetables each day.       This is a list of the screening recommended for you and due dates:  Health Maintenance  Topic Date Due   COVID-19 Vaccine (4 - 2024-25 season) 06/01/2023   DTaP/Tdap/Td vaccine (3 - Td or Tdap) 09/03/2024*   Flu Shot  04/30/2024   Medicare Annual Wellness Visit  04/22/2025   Mammogram  05/21/2025   Pneumococcal Vaccine for age over 59  Completed   DEXA scan (bone density measurement)  Completed   Hepatitis C Screening  Completed   Hepatitis B Vaccine  Aged Out   HPV Vaccine  Aged Out   Meningitis B Vaccine  Aged Out   Screening for Lung Cancer  Discontinued   Colon Cancer Screening  Discontinued   Cologuard (Stool DNA test)  Discontinued   Zoster (Shingles) Vaccine  Discontinued  *Topic was postponed. The date shown is not the original due date.    Advanced directives: (In Chart) A copy of your advanced directives are scanned into your chart should your provider ever need it. Advance Care Planning is important because it:  [x]  Makes sure you receive the medical care that is consistent with your values, goals, and preferences  [x]  It provides guidance to your family and loved ones  and reduces their decisional burden about whether or not they are making the right decisions based on your wishes.  Follow the link provided in your after visit summary or read over the paperwork we have mailed to you to help you started getting your Advance Directives in place. If you need assistance in completing these, please reach out to us  so that we can help you!

## 2024-05-27 DIAGNOSIS — Z1231 Encounter for screening mammogram for malignant neoplasm of breast: Secondary | ICD-10-CM | POA: Diagnosis not present

## 2024-05-27 LAB — HM MAMMOGRAPHY

## 2024-06-15 ENCOUNTER — Telehealth: Payer: Self-pay | Admitting: *Deleted

## 2024-06-15 NOTE — Telephone Encounter (Signed)
 Called patient to inform of CT for 07-13-24- arrival time- 10:45 am @ [facility], no restrictions to scan, patient to receive results on 07-19-24 @ 11:30 am from Dr. Shannon, spoke with patient and she is aware of these appts. and the instructions

## 2024-07-13 ENCOUNTER — Ambulatory Visit (HOSPITAL_COMMUNITY)
Admission: RE | Admit: 2024-07-13 | Discharge: 2024-07-13 | Disposition: A | Discharge status: Home / Self Care | Source: Ambulatory Visit | Attending: Radiology | Admitting: Radiology

## 2024-07-13 DIAGNOSIS — C3490 Malignant neoplasm of unspecified part of unspecified bronchus or lung: Secondary | ICD-10-CM | POA: Diagnosis not present

## 2024-07-13 DIAGNOSIS — J439 Emphysema, unspecified: Secondary | ICD-10-CM | POA: Diagnosis not present

## 2024-07-13 DIAGNOSIS — C3431 Malignant neoplasm of lower lobe, right bronchus or lung: Secondary | ICD-10-CM | POA: Diagnosis not present

## 2024-07-17 NOTE — Progress Notes (Signed)
 Radiation Oncology         [phone] ________________________________  Name: Jessica [name] MRN: [id number]  Date: 07/19/2024  DOB: 06/14/1951  Follow-Up Visit Note  CC: Gretta Comer POUR, NP  Shelah Lamar RAMAN, MD  No diagnosis found.  Diagnosis: Adenocarcinoma of the right lower lobe; Stage IA2 (cT1b,cN0,cM0); s/p SBRT completed on 09/03/2022   Interval Since Last Radiation:  1 year, 10 months, and 15 days   Intent: Curative  Radiation Treatment Dates: 08/27/2022 through 09/03/2022 Site Technique Total Dose (Gy) Dose per Fx (Gy) Completed Fx Beam Energies  Lung, Right: Lung_R IMRT 54/54 18 3/3 6XFFF    Narrative:  The patient returns today for routine follow-up and to review recent imaging. She was last seen here for follow-up on 01/12/24.     In the interval since her last visit, she presented for a follow-up thyroid  US  on 04/06/24 which showed slight interval enlargement of previously visualized right superior solid thyroid  nodule. The various other known thyroid  nodules otherwise appeared similar on this study. FNA of the right superior thyroid  nodule was accordingly collected on 04/12/24 and submitted for cytology. Results showed findings consistent with a  benign follicular nodule (Bethesda category II).    Her most recent chest CT on 07/13/24 demonstrated a new or enlarging slightly lobulated 6 mm left lower lobe pulmonary lesion worrisome for a metachronous adenocarcinoma (of note: her prior CT from 01/08/24 did show a stable subpleural nodule in the LLL which was favored to represent a subpleural lymph node at that time). CT otherwise showed stability of the 5 mm RUL nodule, small scattered mediastinal and hilar lymph nodes, and dense radiation changes involving the right lower lobe posteriorly without evidence of recurrent disease in the RLL.    Other pertinent imaging performed in the interval since her last visit includes a bilateral screening mammogram on 05/27/24  which showed no evidence of malignancy in either breast.   ***                          Allergies:  is allergic to chantix  [varenicline  tartrate].  Meds: Current Outpatient Medications  Medication Sig Dispense Refill   albuterol  (VENTOLIN  HFA) 108 (90 Base) MCG/ACT inhaler Inhale 2 puffs into the lungs every 6 (six) hours as needed for wheezing or shortness of breath. 8 g 6   Calcium -Vitamins C & D (CALCIUM /C/D PO) Take 1 each by mouth 2 (two) times daily. Good sense 1 chew twice a day     cetirizine  (ZYRTEC ) 10 MG tablet Take 1 tablet (10 mg total) by mouth at bedtime. For allergies 90 tablet 3   fluticasone  (FLONASE ) 50 MCG/ACT nasal spray Place 1 spray into both nostrils daily as needed for allergies.     gabapentin  (NEURONTIN ) 100 MG capsule Take 1 capsule (100 mg total) by mouth 3 (three) times daily. 45 capsule 1   Multiple Vitamin (MULTIVITAMIN ADULT PO) Take 1 tablet by mouth daily. Sentry -multivitamin take one daily     rosuvastatin  (CRESTOR ) 20 MG tablet TAKE 1 TABLET BY MOUTH ONCE DAILY IN THE EVENING FOR CHOLESTEROL 90 tablet 2   No current facility-administered medications for this encounter.    Physical Findings: The patient is in no acute distress. Patient is alert and oriented.  vitals were not taken for this visit. .  No significant changes. Lungs are clear to auscultation bilaterally. Heart has regular rate and rhythm. No palpable cervical, supraclavicular, or axillary adenopathy. Abdomen  soft, non-tender, normal bowel sounds.   Lab Findings: Lab Results  Component Value Date   WBC 6.0 09/04/2023   HGB 14.3 09/04/2023   HCT 42.0 09/04/2023   MCV 92.7 09/04/2023   PLT 171.0 09/04/2023    Radiographic Findings: CT CHEST WO CONTRAST Result Date: 07/16/2024 CLINICAL DATA:  Non-small cell lung cancer. Restaging. * Tracking Code: BO * EXAM: CT CHEST WITHOUT CONTRAST TECHNIQUE: Multidetector CT imaging of the chest was performed following the standard protocol  without IV contrast. RADIATION DOSE REDUCTION: This exam was performed according to the departmental dose-optimization program which includes automated exposure control, adjustment of the mA and/or kV according to patient size and/or use of iterative reconstruction technique. COMPARISON:  Multiple prior imaging studies. The most recent CT scan is 01/08/2024 FINDINGS: Cardiovascular: The heart is normal in size. No pericardial effusion. The aorta is normal in caliber. Stable atherosclerotic calcification. Stable three-vessel coronary artery calcifications and calcifications around the aortic valve. Mediastinum/Nodes: Stable small scattered mediastinal and hilar lymph nodes. No new or progressive findings. The esophagus is unremarkable. Lungs/Pleura: Stable underlying emphysematous changes and pulmonary scarring. Stable appearing dense radiation changes involving the right lower lobe posteriorly. Difficult to see lung lesion near the fiducials but I do not see any findings suspicious for recurrent tumor in this area. Stable 5 mm right upper lobe nodule on image number 56/5. Unfortunately, there is a new or enlarging slightly lobulated lesion in the left lower lobe on image number 69/5. This measures approximately 6 mm difficult to see for certain on the prior study but in retrospect may have been present and measured 3 mm. Findings worrisome for metachronous adenocarcinoma. Recommend close CT follow-up. Lesion is somewhat small for PET imaging. No pleural effusions or pleural nodules. Upper Abdomen: No significant upper abdominal findings. No hepatic or adrenal gland lesions. Stable cholelithiasis. Stable aortic calcifications. Musculoskeletal: No breast masses, supraclavicular or axillary adenopathy. Unchanged isthmic thyroid  nodule. This has been evaluated on previous imaging. (ref: J Am Coll Radiol. 2015 Feb;12(2): 143-50). No significant bony findings. Changes of ankylosing spondylitis or DISH. IMPRESSION: 1.  Stable dense radiation changes involving the right lower lobe posteriorly. No findings suspicious for recurrent tumor in this area. 2. New or enlarging slightly lobulated 6 mm left lower lobe pulmonary lesion. Findings worrisome for metachronous adenocarcinoma. Recommend close CT follow-up (3-4 months). 3. Stable 5 mm right upper lobe pulmonary nodule. 4. Stable small scattered mediastinal and hilar lymph nodes. 5. Stable cholelithiasis. Aortic Atherosclerosis (ICD10-I70.0) and Emphysema (ICD10-J43.9). Electronically Signed   By: MYRTIS Stammer M.D.   On: 07/16/2024 11:19    Impression: Adenocarcinoma of the right lower lobe; Stage IA2 (cT1b,cN0,cM0); s/p SBRT completed on 09/03/2022   The patient is recovering from the effects of radiation.  ***  Plan:  ***   *** minutes of total time was spent for this patient encounter, including preparation, face-to-face counseling with the patient and coordination of care, physical exam, and documentation of the encounter. ____________________________________  Lynwood CHARM Nasuti, PhD, MD  This document serves as a record of services personally performed by Lynwood Nasuti, MD. It was created on his behalf by Dorthy Fuse, a trained medical scribe. The creation of this record is based on the scribe's personal observations and the provider's statements to them. This document has been checked and approved by the attending provider.

## 2024-07-19 ENCOUNTER — Encounter: Payer: Self-pay | Admitting: Radiation Oncology

## 2024-07-19 ENCOUNTER — Ambulatory Visit
Admission: RE | Admit: 2024-07-19 | Discharge: 2024-07-19 | Disposition: A | Discharge status: Home / Self Care | Payer: Self-pay | Source: Ambulatory Visit | Attending: Radiation Oncology | Admitting: Radiation Oncology

## 2024-07-19 DIAGNOSIS — C3431 Malignant neoplasm of lower lobe, right bronchus or lung: Secondary | ICD-10-CM | POA: Diagnosis not present

## 2024-07-19 DIAGNOSIS — I2510 Atherosclerotic heart disease of native coronary artery without angina pectoris: Secondary | ICD-10-CM | POA: Diagnosis not present

## 2024-07-19 DIAGNOSIS — K8020 Calculus of gallbladder without cholecystitis without obstruction: Secondary | ICD-10-CM | POA: Diagnosis not present

## 2024-07-19 DIAGNOSIS — Z923 Personal history of irradiation: Secondary | ICD-10-CM | POA: Insufficient documentation

## 2024-07-19 DIAGNOSIS — E041 Nontoxic single thyroid nodule: Secondary | ICD-10-CM | POA: Diagnosis not present

## 2024-07-19 DIAGNOSIS — Z79899 Other long term (current) drug therapy: Secondary | ICD-10-CM | POA: Insufficient documentation

## 2024-07-19 DIAGNOSIS — J439 Emphysema, unspecified: Secondary | ICD-10-CM | POA: Insufficient documentation

## 2024-07-19 DIAGNOSIS — R911 Solitary pulmonary nodule: Secondary | ICD-10-CM | POA: Insufficient documentation

## 2024-07-19 DIAGNOSIS — I700 Atherosclerosis of aorta: Secondary | ICD-10-CM | POA: Insufficient documentation

## 2024-07-19 DIAGNOSIS — F17210 Nicotine dependence, cigarettes, uncomplicated: Secondary | ICD-10-CM | POA: Diagnosis not present

## 2024-07-19 DIAGNOSIS — Z85118 Personal history of other malignant neoplasm of bronchus and lung: Secondary | ICD-10-CM | POA: Insufficient documentation

## 2024-07-19 NOTE — Progress Notes (Signed)
 Jessica [name] is here today for follow up post radiation to the lung.  Lung Side: Right,patient completed treatment on 09/03/22  Does the patient complain of any of the following: Pain: No Shortness of breath w/wo exertion: No Cough: Yes,productive Hemoptysis: No Pain with swallowing: No Swallowing/choking concerns: No Appetite: Good Energy Level: Good Post radiation skin Changes: No    Additional comments if applicable:   BP (P) 135/71 (BP Location: Left Arm, Patient Position: Sitting)   Temp (!) (P) 97.1 F (36.2 C) (Temporal)   Resp (P) 19   Ht (P) 5' 2 (1.575 m)   Wt (P) 162 lb 2 oz (73.5 kg)   SpO2 (P) 99%   BMI (P) 29.65 kg/m

## 2024-07-30 ENCOUNTER — Other Ambulatory Visit: Payer: Self-pay | Admitting: Primary Care

## 2024-07-30 DIAGNOSIS — E785 Hyperlipidemia, unspecified: Secondary | ICD-10-CM

## 2024-07-30 DIAGNOSIS — I700 Atherosclerosis of aorta: Secondary | ICD-10-CM

## 2024-08-22 ENCOUNTER — Other Ambulatory Visit: Payer: Self-pay | Admitting: Primary Care

## 2024-08-22 DIAGNOSIS — J309 Allergic rhinitis, unspecified: Secondary | ICD-10-CM

## 2024-09-07 ENCOUNTER — Ambulatory Visit: Admitting: Primary Care

## 2024-09-07 ENCOUNTER — Encounter: Payer: Self-pay | Admitting: Primary Care

## 2024-09-07 ENCOUNTER — Ambulatory Visit: Payer: Self-pay | Admitting: Primary Care

## 2024-09-07 VITALS — BP 130/64 | HR 80 | Temp 98.0°F | Ht 61.5 in | Wt 164.1 lb

## 2024-09-07 DIAGNOSIS — Z0000 Encounter for general adult medical examination without abnormal findings: Secondary | ICD-10-CM

## 2024-09-07 DIAGNOSIS — Z23 Encounter for immunization: Secondary | ICD-10-CM

## 2024-09-07 DIAGNOSIS — E785 Hyperlipidemia, unspecified: Secondary | ICD-10-CM

## 2024-09-07 DIAGNOSIS — C3431 Malignant neoplasm of lower lobe, right bronchus or lung: Secondary | ICD-10-CM

## 2024-09-07 DIAGNOSIS — M810 Age-related osteoporosis without current pathological fracture: Secondary | ICD-10-CM

## 2024-09-07 DIAGNOSIS — E042 Nontoxic multinodular goiter: Secondary | ICD-10-CM

## 2024-09-07 DIAGNOSIS — J439 Emphysema, unspecified: Secondary | ICD-10-CM

## 2024-09-07 LAB — COMPREHENSIVE METABOLIC PANEL WITH GFR
ALT: 14 U/L (ref 0–35)
AST: 17 U/L (ref 0–37)
Albumin: 4.6 g/dL (ref 3.5–5.2)
Alkaline Phosphatase: 71 U/L (ref 39–117)
BUN: 22 mg/dL (ref 6–23)
CO2: 30 meq/L (ref 19–32)
Calcium: 9.5 mg/dL (ref 8.4–10.5)
Chloride: 103 meq/L (ref 96–112)
Creatinine, Ser: 1.16 mg/dL (ref 0.40–1.20)
GFR: 46.17 mL/min — ABNORMAL LOW (ref >60.00)
Glucose, Bld: 94 mg/dL (ref 70–99)
Potassium: 4.8 meq/L (ref 3.5–5.1)
Sodium: 142 meq/L (ref 135–145)
Total Bilirubin: 0.5 mg/dL (ref 0.2–1.2)
Total Protein: 7.6 g/dL (ref 6.0–8.3)

## 2024-09-07 LAB — LIPID PANEL
Cholesterol: 157 mg/dL (ref 0–200)
HDL: 44.2 mg/dL (ref >39.00)
LDL Cholesterol: 62 mg/dL (ref 0–99)
NonHDL: 112.95
Total CHOL/HDL Ratio: 4
Triglycerides: 253 mg/dL — ABNORMAL HIGH (ref 0.0–149.0)
VLDL: 50.6 mg/dL — ABNORMAL HIGH (ref 0.0–40.0)

## 2024-09-07 LAB — HEMOGLOBIN A1C: Hgb A1c MFr Bld: 5.6 % (ref 4.6–6.5)

## 2024-09-07 MED ORDER — ALBUTEROL SULFATE HFA 108 (90 BASE) MCG/ACT IN AERS: 2.0000 | INHALATION_SPRAY | Freq: Four times a day (QID) | RESPIRATORY_TRACT | 0 refills | Status: AC | PRN

## 2024-09-07 MED ORDER — ALENDRONATE SODIUM 70 MG PO TABS: 70.0000 mg | ORAL_TABLET | ORAL | 3 refills | Status: AC

## 2024-09-07 NOTE — Assessment & Plan Note (Signed)
 Immunizations UTD. Influenza vaccine provided today.  Mammogram and pharmacy scan up-to-date.- Colonoscopy no further screening needed  Discussed the importance of a healthy diet and regular exercise in order for weight loss, and to reduce the risk of further co-morbidity.  Exam stable. Labs pending.  Follow up in 1 year for repeat physical.

## 2024-09-07 NOTE — Patient Instructions (Signed)
 Stop by the lab prior to leaving today. I will notify you of your results once received.   Start alendronate  (Fosamax ) 70 mg once weekly for bone density.  Take 1 tablet by mouth once weekly on empty stomach with a full glass of water only.  Do not eat for at least 30 minutes.  Avoid laying flat for 2 hours.  It was a pleasure to see you today!

## 2024-09-07 NOTE — Assessment & Plan Note (Signed)
 Repeat lipid panel pending. ? ?Continue rosuvastatin 20 mg daily. ?

## 2024-09-07 NOTE — Assessment & Plan Note (Signed)
 Controlled.  No concerns today. Irregular use of albuterol , will refill today.

## 2024-09-07 NOTE — Assessment & Plan Note (Signed)
 Reviewed bone density scan results with patient today. Recommended bisphosphonate treatment given results.  She agrees.  Start Fosamax  70 mg once weekly.  We discussed specific instructions for administration. Repeat bone density scan in December 2026.

## 2024-09-07 NOTE — Assessment & Plan Note (Signed)
 Following with radiology oncology, office notes reviewed from October 2025.

## 2024-09-07 NOTE — Assessment & Plan Note (Signed)
 Thyroid  nodule biopsy in July 2025 benign  Repeat thyroid  ultrasound in July 2026

## 2024-09-07 NOTE — Progress Notes (Signed)
 Subjective:    Patient ID: Jessica [name], female    DOB: Jul 23, 1951, 73 y.o.   MRN: [id number]  Jessica [name] is a very pleasant 73 y.o. female who presents today for complete physical and follow up of chronic conditions.  Immunizations: -Tetanus: Completed in 2013 -Influenza: Influenza vaccine provided today.  -Shingles: Completed Zostavax -Pneumonia: Completed Prevnar 13 2016, Pneumovax 23 in 2021, Prevnar 20   Diet: Fair diet.  Exercise: No regular exercise.  Eye exam: Completed > 1 year ago  Dental exam: Completed > 1 year ago    Mammogram: Completed in August 2025 Bone Density Scan: Completed in December 2024  Colonoscopy: Completed in 2023, no further colonoscopy recommended given narrowing limited to left colon. Lung Cancer Screening: Completed in April 2025  BP Readings from Last 3 Encounters:  09/07/24 130/64  07/19/24 (P) 135/71  01/12/24 137/65       Review of Systems  Constitutional:  Negative for unexpected weight change.  HENT:  Negative for rhinorrhea.   Respiratory:  Negative for cough and shortness of breath.   Cardiovascular:  Negative for chest pain.  Gastrointestinal:  Negative for constipation and diarrhea.  Genitourinary:  Negative for difficulty urinating.  Musculoskeletal:  Negative for arthralgias and myalgias.  Skin:  Negative for rash.  Allergic/Immunologic: Negative for environmental allergies.  Neurological:  Negative for dizziness and headaches.  Psychiatric/Behavioral:  The patient is not nervous/anxious.          Past Medical History:  Diagnosis Date   Allergy    seasonal allergies   Anemia    as a teenager   Arthrosis of right acromioclavicular joint 2015   Cataract    sx 2019   COPD (chronic obstructive pulmonary disease) (HCC)    infreq exac, continued tobacco abuse   Dyslipidemia    GERD (gastroesophageal reflux disease)    with certain foods   History of kidney stones    History of radiation therapy     Right lung- 08/27/22-09/03/22- Dr. Lynwood Nasuti   Hyperlipidemia    on meds   Microscopic hematuria 07/28/2019   Osteoarthritis of knee    R knee   Pneumonia    Right elbow pain 05/17/2019   Seasonal allergies     Social History   Socioeconomic History   Marital status: Married    Spouse name: Not on file   Number of children: Not on file   Years of education: Not on file   Highest education level: Not on file  Occupational History   Not on file  Tobacco Use   Smoking status: Every Day    Current packs/day: 0.25    Average packs/day: 0.3 packs/day for 51.0 years (12.8 ttl pk-yrs)    Types: Cigarettes    Passive exposure: Current   Smokeless tobacco: Never   Tobacco comments:    About 4 cigarettes per day 09/10/22 [facility]   Vaping Use   Vaping status: Never Used  Substance and Sexual Activity   Alcohol use: Yes    Alcohol/week: 0.0 standard drinks of alcohol    Comment: rarely   Drug use: No   Sexual activity: Not on file  Other Topics Concern   Not on file  Social History Narrative   Not on file   Social Drivers of Health   Financial Resource Strain: Low Risk  (04/22/2024)   Overall Financial Resource Strain (CARDIA)    Difficulty of Paying Living Expenses: Not hard at all  Food Insecurity: No Food  Insecurity (04/22/2024)   Hunger Vital Sign    Worried About Running Out of Food in the Last Year: Never true    Ran Out of Food in the Last Year: Never true  Transportation Needs: No Transportation Needs (04/22/2024)   PRAPARE - Administrator, Civil Service (Medical): No    Lack of Transportation (Non-Medical): No  Physical Activity: Inactive (04/22/2024)   Exercise Vital Sign    Days of Exercise per Week: 0 days    Minutes of Exercise per Session: 0 min  Stress: No Stress Concern Present (04/22/2024)   [employer] of Occupational Health - Occupational Stress Questionnaire    Feeling of Stress: Not at all  Social Connections: Moderately Isolated  (04/22/2024)   Social Connection and Isolation Panel    Frequency of Communication with Friends and Family: More than three times a week    Frequency of Social Gatherings with Friends and Family: Twice a week    Attends Religious Services: Never    Database Administrator or Organizations: No    Attends Engineer, Structural: Not on file    Marital Status: Married  Intimate Partner Violence: Not At Risk (04/22/2024)   Humiliation, Afraid, Rape, and Kick questionnaire    Fear of Current or Ex-Partner: No    Emotionally Abused: No    Physically Abused: No    Sexually Abused: No    Past Surgical History:  Procedure Laterality Date   ABDOMINAL HYSTERECTOMY  1992   APPENDECTOMY  1995   BRONCHIAL BIOPSY  08/05/2022   Procedure: BRONCHIAL BIOPSIES;  Surgeon: Shelah Lamar RAMAN, MD;  Location: [facility];  Service: Pulmonary;;   BRONCHIAL BRUSHINGS  08/05/2022   Procedure: BRONCHIAL BRUSHINGS;  Surgeon: Shelah Lamar RAMAN, MD;  Location: [facility];  Service: Pulmonary;;   BRONCHIAL NEEDLE ASPIRATION BIOPSY  08/05/2022   Procedure: BRONCHIAL NEEDLE ASPIRATION BIOPSIES;  Surgeon: Shelah Lamar RAMAN, MD;  Location: [facility] ENDOSCOPY;  Service: Pulmonary;;   BRONCHIAL WASHINGS  08/05/2022   Procedure: BRONCHIAL WASHINGS;  Surgeon: Shelah Lamar RAMAN, MD;  Location: [facility] ENDOSCOPY;  Service: Pulmonary;;   COLONOSCOPY     CYSTOSCOPY/URETEROSCOPY/HOLMIUM LASER/STENT PLACEMENT Right 08/03/2019   Procedure: CYSTOSCOPY/URETEROSCOPY/STENT PLACEMENT;  Surgeon: Elisabeth Valli BIRCH, MD;  Location: [facility];  Service: Urology;  Laterality: Right;  90 MINS   CYSTOSCOPY/URETEROSCOPY/HOLMIUM LASER/STENT PLACEMENT Right 08/17/2019   Procedure: CYSTOSCOPY/URETEROSCOPY/HOLMIUM LASER/STENT EXCHANGED;  Surgeon: Elisabeth Valli BIRCH, MD;  Location[facility];  Service: Urology;  Laterality: Right;   FIDUCIAL MARKER PLACEMENT  08/05/2022   Procedure: FIDUCIAL MARKER PLACEMENT;  Surgeon: Shelah Lamar RAMAN, MD;  Location:  [facility] ENDOSCOPY;  Service: Pulmonary;;   MOUTH SURGERY  2011   dental implants   POLYPECTOMY     right knee surgery  1988   torn cartledge   1998/2002   ROTATOR CUFF REPAIR Right 2015   TONSILLECTOMY  1956   TUBAL LIGATION  1980   VIDEO BRONCHOSCOPY WITH RADIAL ENDOBRONCHIAL ULTRASOUND  08/05/2022   Procedure: VIDEO BRONCHOSCOPY WITH RADIAL ENDOBRONCHIAL ULTRASOUND;  Surgeon: Shelah Lamar RAMAN, MD;  Location: [facility];  Service: Pulmonary;;   WISDOM TOOTH EXTRACTION  1970    Family History  Problem Relation Age of Onset   Hypertension Mother    Heart disease Mother    Colon polyps Mother 48   Multiple sclerosis Father    Cancer Brother        Pancreatic   Hypertension Brother    Breast cancer Maternal Aunt  Stroke Other    Colon cancer Neg Hx    Esophageal cancer Neg Hx    Rectal cancer Neg Hx    Stomach cancer Neg Hx    Crohn's disease Neg Hx     Allergies  Allergen Reactions   Chantix  [Varenicline  Tartrate] Other (See Comments)    Nausea,vomiting,spitting,when switched to higher dosage    Current Outpatient Medications on File Prior to Visit  Medication Sig Dispense Refill   Calcium -Vitamins C & D (CALCIUM /C/D PO) Take 1 each by mouth 2 (two) times daily. Good sense 1 chew twice a day     cetirizine  (ZYRTEC ) 10 MG tablet TAKE 1 TABLET BY MOUTH AT BEDTIME FOR ALLERGIES 90 tablet 0   fluticasone  (FLONASE ) 50 MCG/ACT nasal spray Place 1 spray into both nostrils daily as needed for allergies.     Multiple Vitamin (MULTIVITAMIN ADULT PO) Take 1 tablet by mouth daily. Sentry -multivitamin take one daily     rosuvastatin  (CRESTOR ) 20 MG tablet TAKE 1 TABLET BY MOUTH ONCE DAILY IN THE EVENING FOR CHOLESTEROL 90 tablet 0   No current facility-administered medications on file prior to visit.    BP 130/64   Pulse 80   Temp 98 F (36.7 C) (Oral)   Ht 5' 1.5 (1.562 m)   Wt 164 lb 2 oz (74.4 kg)   SpO2 96%   BMI 30.51 kg/m  Objective:   Physical Exam HENT:      Right Ear: Tympanic membrane and ear canal normal.     Left Ear: Tympanic membrane and ear canal normal.  Eyes:     Pupils: Pupils are equal, round, and reactive to light.  Cardiovascular:     Rate and Rhythm: Normal rate and regular rhythm.  Pulmonary:     Effort: Pulmonary effort is normal.     Breath sounds: Normal breath sounds.  Abdominal:     General: Bowel sounds are normal.     Palpations: Abdomen is soft.     Tenderness: There is no abdominal tenderness.  Musculoskeletal:        General: Normal range of motion.     Cervical back: Neck supple.  Skin:    General: Skin is warm and dry.  Neurological:     Mental Status: She is alert and oriented to person, place, and time.     Cranial Nerves: No cranial nerve deficit.     Deep Tendon Reflexes:     Reflex Scores:      Patellar reflexes are 2+ on the right side and 2+ on the left side. Psychiatric:        Mood and Affect: Mood normal.     Physical Exam        Assessment & Plan:  Need for influenza vaccination -     Flu vaccine HIGH DOSE PF(Fluzone Trivalent)  Primary cancer of right lower lobe of lung [facility]) Assessment & Plan: Following with radiology oncology, office notes reviewed from October 2025.    Chronic obstructive pulmonary disease with emphysema, unspecified emphysema type (HCC) Assessment & Plan: Controlled.  No concerns today. Irregular use of albuterol , will refill today.  Orders: -     Albuterol  Sulfate HFA; Inhale 2 puffs into the lungs every 6 (six) hours as needed for wheezing or shortness of breath.  Dispense: 18 g; Refill: 0  Dyslipidemia Assessment & Plan: Repeat lipid panel pending.  Continue rosuvastatin  20 mg daily.  Orders: -     Comprehensive metabolic panel with GFR -  Lipid panel -     Hemoglobin A1c  Osteoporosis without current pathological fracture, unspecified osteoporosis type Assessment & Plan: Reviewed bone density scan results with patient today. Recommended  bisphosphonate treatment given results.  She agrees.  Start Fosamax  70 mg once weekly.  We discussed specific instructions for administration. Repeat bone density scan in December 2026.  Orders: -     Alendronate  Sodium; Take 1 tablet (70 mg total) by mouth every 7 (seven) days. Take with a full glass of water on an empty stomach. Avoid laying flat for 2 hours.  Dispense: 12 tablet; Refill: 3  Multiple thyroid  nodules Assessment & Plan: Thyroid  nodule biopsy in July 2025 benign  Repeat thyroid  ultrasound in July 2026   Routine general medical examination at a health care facility Assessment & Plan: Immunizations UTD. Influenza vaccine provided today.  Mammogram and pharmacy scan up-to-date.- Colonoscopy no further screening needed  Discussed the importance of a healthy diet and regular exercise in order for weight loss, and to reduce the risk of further co-morbidity.  Exam stable. Labs pending.  Follow up in 1 year for repeat physical.      Assessment and Plan Assessment & Plan         Comer MARLA Gaskins, NP

## 2024-10-07 ENCOUNTER — Telehealth: Payer: Self-pay | Admitting: *Deleted

## 2024-10-07 NOTE — Telephone Encounter (Signed)
 CALLED PATIENT TO INFORM OF CT FOR 10-19-24- ARRIVAL TIME- 11:30 AM @ [facility], NO RESTRICTIONS TO SCAN, PATIENT TO RECEIVE RESULTS FROM DR. KINARD ON 10-25-24 @ 11 AM, SPOKE WITH PATIENT AND SHE IS AWARE OF THESE APPTS. AND THE INSTRUCTIONS

## 2024-10-19 ENCOUNTER — Ambulatory Visit (HOSPITAL_COMMUNITY)
Admission: RE | Admit: 2024-10-19 | Discharge: 2024-10-19 | Disposition: A | Discharge status: Home / Self Care | Source: Ambulatory Visit | Attending: Radiology | Admitting: Radiology

## 2024-10-19 DIAGNOSIS — C3431 Malignant neoplasm of lower lobe, right bronchus or lung: Secondary | ICD-10-CM | POA: Diagnosis present

## 2024-10-21 ENCOUNTER — Encounter: Payer: Self-pay | Admitting: Radiation Oncology

## 2024-10-21 ENCOUNTER — Ambulatory Visit
Admission: RE | Admit: 2024-10-21 | Discharge: 2024-10-21 | Disposition: A | Discharge status: Home / Self Care | Source: Ambulatory Visit | Attending: Radiation Oncology | Admitting: Radiation Oncology

## 2024-10-21 DIAGNOSIS — C3431 Malignant neoplasm of lower lobe, right bronchus or lung: Secondary | ICD-10-CM

## 2024-10-21 NOTE — Progress Notes (Signed)
 "  Radiation Oncology         [phone] ________________________________  Name: Jessica [name] MRN: [id number]  Date: 10/21/2024  DOB: 06/08/1951  Outpatient Follow-up Visit - Conducted via telephone at patient request.   I spoke with the patient to conduct this consult visit via telephone. The patient was notified in advance and was offered an in person or telemedicine meeting to allow for face to face communication but instead preferred to proceed with a telephone     CC: Gretta Katherine K, NP  Gretta Comer POUR, NP    ICD-10-CM   1. Primary cancer of right lower lobe of lung (HCC)  C34.31       Diagnosis: Adenocarcinoma of the right lower lobe; Stage IA2 (cT1b,cN0,cM0); s/p SBRT completed on 09/03/2022   Interval Since Last Radiation:  approximately 2 years and 2 months  Intent: Curative  Radiation Treatment Dates: 08/27/2022 through 09/03/2022 Site Technique Total Dose (Gy) Dose per Fx (Gy) Completed Fx Beam Energies  Lung, Right: Lung_R IMRT 54/54 18 3/3 6XFFF    Narrative:  The patient returns today for routine follow-up and to review recent imaging. She was last seen here for follow-up on 07/19/2024.  CT of the chest on 10/19/2024 demonstrated progressive enlargement of superior segment left lower lobe nodule, highly suspicious for bronchogenic carcinoma; treatment changes around the fiducial marker in the superior segment of the right lower lobe appear more mass-like on the axial images; additional scattered smaller nodules are stable; no evidence of thoracic adenopathy.   Patient reports to be doing well overall today. She denies any shortness of breath, cough, or chest pain. She denies any other issues to her breathing since she was last seen.           Allergies:  is allergic to chantix  [varenicline  tartrate].  Meds: Current Outpatient Medications  Medication Sig Dispense Refill   albuterol  (VENTOLIN  HFA) 108 (90 Base) MCG/ACT inhaler Inhale 2 puffs into the  lungs every 6 (six) hours as needed for wheezing or shortness of breath. 18 g 0   alendronate  (FOSAMAX ) 70 MG tablet Take 1 tablet (70 mg total) by mouth every 7 (seven) days. Take with a full glass of water on an empty stomach. Avoid laying flat for 2 hours. 12 tablet 3   Calcium -Vitamins C & D (CALCIUM /C/D PO) Take 1 each by mouth 2 (two) times daily. Good sense 1 chew twice a day     cetirizine  (ZYRTEC ) 10 MG tablet TAKE 1 TABLET BY MOUTH AT BEDTIME FOR ALLERGIES 90 tablet 0   fluticasone  (FLONASE ) 50 MCG/ACT nasal spray Place 1 spray into both nostrils daily as needed for allergies.     Multiple Vitamin (MULTIVITAMIN ADULT PO) Take 1 tablet by mouth daily. Sentry -multivitamin take one daily     rosuvastatin  (CRESTOR ) 20 MG tablet TAKE 1 TABLET BY MOUTH ONCE DAILY IN THE EVENING FOR CHOLESTEROL 90 tablet 0   No current facility-administered medications for this encounter.    Physical Findings:  The patient is in no acute distress.   Deferred, due to nature of the telephone visit.    Lab Findings: Lab Results  Component Value Date   WBC 6.0 09/04/2023   HGB 14.3 09/04/2023   HCT 42.0 09/04/2023   MCV 92.7 09/04/2023   PLT 171.0 09/04/2023    Radiographic Findings: CT CHEST WO CONTRAST Result Date: 10/21/2024 CLINICAL DATA:  Non-small cell lung cancer, monitor. * Tracking Code: BO * EXAM: CT CHEST WITHOUT CONTRAST  TECHNIQUE: Multidetector CT imaging of the chest was performed following the standard protocol without IV contrast. RADIATION DOSE REDUCTION: This exam was performed according to the departmental dose-optimization program which includes automated exposure control, adjustment of the mA and/or kV according to patient size and/or use of iterative reconstruction technique. COMPARISON:  Chest CT 07/13/2024 and 01/08/2024. FINDINGS: Cardiovascular: Aortic and coronary artery atherosclerosis. The heart size is normal. There is no pericardial effusion. Mediastinum/Nodes: There are no  enlarged mediastinal, hilar or axillary lymph nodes.Hilar assessment is limited by the lack of intravenous contrast, although the hilar contours appear unchanged. Stable mild heterogeneity of the thyroid  gland which has been previously evaluated by thyroid  ultrasound and biopsy. The esophagus appears unremarkable. Lungs/Pleura: No pleural effusion or pneumothorax. Moderate centrilobular and paraseptal emphysema. The enlarging nodule in the superior segment of the left lower lobe on the most recent CT demonstrates progressive enlargement, now measuring 9 x 8 mm on image 64/8. This most recently measured 7 mm maximally. This has lobulated margins and is highly suspicious for bronchogenic carcinoma. Treatment changes around the fiducial marker in the superior segment of the right lower lobe appears more mass-like on the axial images, measuring 3.1 x 2.0 cm on image 75/8. This is predominately bandlike and slightly less concerning on the reformatted images and may be secondary to post treatment changes. Additional scattered small part solid nodules bilaterally are unchanged. No other new or enlarging nodules are identified. Upper abdomen: Small calcified gallstones. No adrenal mass or other significant findings in the visualized upper abdomen. Musculoskeletal/Chest wall: There is no chest wall mass or suspicious osseous finding. Multilevel spondylosis. Ankylosing osteophytes in the thoracic spine. IMPRESSION: 1. Progressive enlargement of superior segment left lower lobe nodule, highly suspicious for bronchogenic carcinoma. Recommend further evaluation with PET-CT. 2. Treatment changes around the fiducial marker in the superior segment of the right lower lobe appear more mass-like on the axial images, although are less concerning on the reformatted images and may be secondary to post treatment changes. This can also be evaluated with PET-CT, to exclude local recurrence. 3. Additional scattered smaller nodules are  stable. 4. No evidence of thoracic adenopathy or pleural effusion. 5. Cholelithiasis. 6. Aortic Atherosclerosis (ICD10-I70.0) and Emphysema (ICD10-J43.9). Electronically Signed   By: Elsie Perone M.D.   On: 10/21/2024 13:57    Impression: Adenocarcinoma of the right lower lobe; Stage IA2 (cT1b,cN0,cM0); s/p SBRT completed on 09/03/2022   The patient is doing well overall today.  We personally reviewed the results of her most recent CT scan. Imaging unfortunately demonstrated enlargement of the LLL nodule, concerning for malignancy. Treatment changes were also noted along the RLL nodule. Dr. Shannon recommends PET imaging for further evaluation.   Plan: PET imaging ordered today. I will call the patient with the results and the plan moving forward.     This encounter was conducted via telephone.  The patient has provided two factor identification and has given verbal consent for this type of encounter and has been advised to only accept a meeting of this type in a secure network environment.  The time spent during this encounter was 20 minutes including preparation, discussion, and coordination of the patient's care  The attendants for this meeting include Leeroy Due PA-C and patient. During the encounter Leeroy Due PA-C was located at [facility] Radiation Oncology Department.  Patient was located at home.  ____________________________________   Leeroy Due, PA-C   This document serves as a record of services personally performed by  Leeroy Due, PA-C. It was created on his behalf by Dorthy Fuse, a trained medical scribe. The creation of this record is based on the scribe's personal observations and the provider's statements to them. This document has been checked and approved by the attending provider.  "

## 2024-10-21 NOTE — Progress Notes (Signed)
 Jessica [name] has telephone follow up today post radiation to the lung. Identity verified.   Lung Side: Right,patient completed treatment on 09/03/22.   Does the patient complain of any of the following: Pain: No Shortness of breath w/wo exertion:  No Cough: Yes  Hemoptysis: No Pain with swallowing: No Swallowing/choking concerns: No  Post radiation skin Changes: No    Additional comments if applicable:

## 2024-10-25 ENCOUNTER — Ambulatory Visit: Admission: RE | Admit: 2024-10-25 | Source: Ambulatory Visit | Admitting: Radiation Oncology

## 2024-10-26 ENCOUNTER — Other Ambulatory Visit: Payer: Self-pay | Admitting: Primary Care

## 2024-10-26 DIAGNOSIS — E785 Hyperlipidemia, unspecified: Secondary | ICD-10-CM

## 2024-10-26 DIAGNOSIS — I700 Atherosclerosis of aorta: Secondary | ICD-10-CM

## 2024-11-05 ENCOUNTER — Encounter (HOSPITAL_COMMUNITY): Admission: RE | Admit: 2024-11-05 | Source: Ambulatory Visit

## 2024-11-05 DIAGNOSIS — Z720 Tobacco use: Secondary | ICD-10-CM

## 2024-11-05 DIAGNOSIS — C3431 Malignant neoplasm of lower lobe, right bronchus or lung: Secondary | ICD-10-CM

## 2024-11-05 DIAGNOSIS — R911 Solitary pulmonary nodule: Secondary | ICD-10-CM

## 2024-11-05 LAB — GLUCOSE, CAPILLARY: Glucose-Capillary: 94 mg/dL (ref 70–99)

## 2024-11-05 MED ORDER — FLUDEOXYGLUCOSE F - 18 (FDG) INJECTION
8.1000 | Freq: Once | INTRAVENOUS | Status: AC | PRN
  Administered 2024-11-05: 8.1 via INTRAVENOUS

## 2025-04-25 ENCOUNTER — Ambulatory Visit
